# Patient Record
Sex: Female | Born: 1955 | Race: White | Hispanic: No | Marital: Married | Smoking: Never smoker
Health system: Southern US, Community
[De-identification: ages and names within clinical notes are randomized; demographics above are authoritative.]

## PROBLEM LIST (undated history)

## (undated) DIAGNOSIS — Q7962 Hypermobile Ehlers-Danlos syndrome: Secondary | ICD-10-CM

## (undated) DIAGNOSIS — E213 Hyperparathyroidism, unspecified: Secondary | ICD-10-CM

## (undated) DIAGNOSIS — M545 Low back pain, unspecified: Secondary | ICD-10-CM

## (undated) DIAGNOSIS — R112 Nausea with vomiting, unspecified: Secondary | ICD-10-CM

## (undated) DIAGNOSIS — Z9889 Other specified postprocedural states: Secondary | ICD-10-CM

## (undated) DIAGNOSIS — M79606 Pain in leg, unspecified: Secondary | ICD-10-CM

## (undated) DIAGNOSIS — Z87442 Personal history of urinary calculi: Secondary | ICD-10-CM

## (undated) DIAGNOSIS — E669 Obesity, unspecified: Secondary | ICD-10-CM

## (undated) DIAGNOSIS — M797 Fibromyalgia: Secondary | ICD-10-CM

## (undated) DIAGNOSIS — G4700 Insomnia, unspecified: Secondary | ICD-10-CM

## (undated) DIAGNOSIS — J189 Pneumonia, unspecified organism: Secondary | ICD-10-CM

## (undated) DIAGNOSIS — E785 Hyperlipidemia, unspecified: Secondary | ICD-10-CM

## (undated) DIAGNOSIS — R011 Cardiac murmur, unspecified: Secondary | ICD-10-CM

## (undated) DIAGNOSIS — J45909 Unspecified asthma, uncomplicated: Secondary | ICD-10-CM

## (undated) DIAGNOSIS — T7840XA Allergy, unspecified, initial encounter: Secondary | ICD-10-CM

## (undated) DIAGNOSIS — T4145XA Adverse effect of unspecified anesthetic, initial encounter: Secondary | ICD-10-CM

## (undated) DIAGNOSIS — I10 Essential (primary) hypertension: Secondary | ICD-10-CM

## (undated) DIAGNOSIS — F419 Anxiety disorder, unspecified: Secondary | ICD-10-CM

## (undated) DIAGNOSIS — K219 Gastro-esophageal reflux disease without esophagitis: Secondary | ICD-10-CM

## (undated) DIAGNOSIS — K760 Fatty (change of) liver, not elsewhere classified: Secondary | ICD-10-CM

## (undated) DIAGNOSIS — H269 Unspecified cataract: Secondary | ICD-10-CM

## (undated) DIAGNOSIS — Z8709 Personal history of other diseases of the respiratory system: Secondary | ICD-10-CM

## (undated) DIAGNOSIS — T8859XA Other complications of anesthesia, initial encounter: Secondary | ICD-10-CM

## (undated) DIAGNOSIS — R7303 Prediabetes: Secondary | ICD-10-CM

## (undated) DIAGNOSIS — Z8719 Personal history of other diseases of the digestive system: Secondary | ICD-10-CM

## (undated) DIAGNOSIS — M1990 Unspecified osteoarthritis, unspecified site: Secondary | ICD-10-CM

## (undated) DIAGNOSIS — Z87448 Personal history of other diseases of urinary system: Secondary | ICD-10-CM

## (undated) DIAGNOSIS — M810 Age-related osteoporosis without current pathological fracture: Secondary | ICD-10-CM

## (undated) DIAGNOSIS — G4730 Sleep apnea, unspecified: Secondary | ICD-10-CM

## (undated) HISTORY — PX: OTHER SURGICAL HISTORY: SHX169

## (undated) HISTORY — DX: Age-related osteoporosis without current pathological fracture: M81.0

## (undated) HISTORY — PX: JOINT REPLACEMENT: SHX530

## (undated) HISTORY — DX: Unspecified cataract: H26.9

## (undated) HISTORY — DX: Unspecified osteoarthritis, unspecified site: M19.90

## (undated) HISTORY — PX: BREAST BIOPSY: SHX20

## (undated) HISTORY — PX: SPINE SURGERY: SHX786

## (undated) HISTORY — DX: Hypermobile Ehlers-Danlos syndrome: Q79.62

## (undated) HISTORY — DX: Allergy, unspecified, initial encounter: T78.40XA

## (undated) HISTORY — DX: Unspecified asthma, uncomplicated: J45.909

## (undated) HISTORY — PX: CERVICAL DISCECTOMY: SHX98

## (undated) HISTORY — PX: CHOLECYSTECTOMY: SHX55

## (undated) HISTORY — DX: Essential (primary) hypertension: I10

## (undated) HISTORY — PX: COLONOSCOPY: SHX174

## (undated) HISTORY — PX: LAPAROSCOPIC GASTRIC RESTRICTIVE DUODENAL PROCEDURE (DUODENAL SWITCH): SHX6667

## (undated) HISTORY — PX: UPPER GASTROINTESTINAL ENDOSCOPY: SHX188

## (undated) HISTORY — PX: APPENDECTOMY: SHX54

---

## 1998-02-09 ENCOUNTER — Other Ambulatory Visit: Admission: RE | Admit: 1998-02-09 | Discharge: 1998-02-09 | Payer: Self-pay | Admitting: Internal Medicine

## 1998-04-06 ENCOUNTER — Other Ambulatory Visit: Admission: RE | Admit: 1998-04-06 | Discharge: 1998-04-06 | Payer: Self-pay | Admitting: Obstetrics and Gynecology

## 1998-09-05 ENCOUNTER — Ambulatory Visit (HOSPITAL_COMMUNITY): Admission: RE | Admit: 1998-09-05 | Discharge: 1998-09-05 | Payer: Self-pay | Admitting: Cardiology

## 1999-08-31 ENCOUNTER — Encounter: Admission: RE | Admit: 1999-08-31 | Discharge: 1999-08-31 | Payer: Self-pay | Admitting: Obstetrics and Gynecology

## 1999-08-31 ENCOUNTER — Encounter: Payer: Self-pay | Admitting: Obstetrics and Gynecology

## 1999-12-18 ENCOUNTER — Other Ambulatory Visit: Admission: RE | Admit: 1999-12-18 | Discharge: 1999-12-18 | Payer: Self-pay | Admitting: Obstetrics and Gynecology

## 2000-03-18 ENCOUNTER — Encounter: Admission: RE | Admit: 2000-03-18 | Discharge: 2000-03-18 | Payer: Self-pay | Admitting: Internal Medicine

## 2000-03-18 ENCOUNTER — Encounter: Payer: Self-pay | Admitting: Internal Medicine

## 2000-03-31 ENCOUNTER — Ambulatory Visit (HOSPITAL_COMMUNITY): Admission: RE | Admit: 2000-03-31 | Discharge: 2000-03-31 | Payer: Self-pay | Admitting: Orthopedic Surgery

## 2000-03-31 ENCOUNTER — Encounter: Payer: Self-pay | Admitting: Orthopedic Surgery

## 2000-09-25 ENCOUNTER — Encounter: Payer: Self-pay | Admitting: Obstetrics and Gynecology

## 2000-09-25 ENCOUNTER — Encounter: Admission: RE | Admit: 2000-09-25 | Discharge: 2000-09-25 | Payer: Self-pay | Admitting: Obstetrics and Gynecology

## 2001-07-15 ENCOUNTER — Encounter: Payer: Self-pay | Admitting: Internal Medicine

## 2001-07-15 ENCOUNTER — Encounter: Admission: RE | Admit: 2001-07-15 | Discharge: 2001-07-15 | Payer: Self-pay | Admitting: Internal Medicine

## 2001-08-25 ENCOUNTER — Encounter: Payer: Self-pay | Admitting: Internal Medicine

## 2001-08-25 ENCOUNTER — Encounter: Admission: RE | Admit: 2001-08-25 | Discharge: 2001-08-25 | Payer: Self-pay | Admitting: Internal Medicine

## 2002-04-20 ENCOUNTER — Encounter: Payer: Self-pay | Admitting: Internal Medicine

## 2002-04-20 ENCOUNTER — Encounter: Admission: RE | Admit: 2002-04-20 | Discharge: 2002-04-20 | Payer: Self-pay | Admitting: Obstetrics and Gynecology

## 2003-04-20 ENCOUNTER — Ambulatory Visit (HOSPITAL_COMMUNITY): Admission: RE | Admit: 2003-04-20 | Discharge: 2003-04-20 | Payer: Self-pay | Admitting: Gastroenterology

## 2003-10-14 ENCOUNTER — Ambulatory Visit (HOSPITAL_COMMUNITY): Admission: RE | Admit: 2003-10-14 | Discharge: 2003-10-14 | Payer: Self-pay | Admitting: Gastroenterology

## 2003-10-14 ENCOUNTER — Encounter (INDEPENDENT_AMBULATORY_CARE_PROVIDER_SITE_OTHER): Payer: Self-pay | Admitting: *Deleted

## 2003-11-05 ENCOUNTER — Encounter: Admission: RE | Admit: 2003-11-05 | Discharge: 2003-11-05 | Payer: Self-pay | Admitting: Orthopedic Surgery

## 2004-09-13 ENCOUNTER — Encounter: Admission: RE | Admit: 2004-09-13 | Discharge: 2004-09-13 | Payer: Self-pay | Admitting: Internal Medicine

## 2004-10-15 ENCOUNTER — Encounter: Admission: RE | Admit: 2004-10-15 | Discharge: 2004-10-15 | Payer: Self-pay | Admitting: Internal Medicine

## 2005-04-01 ENCOUNTER — Emergency Department (HOSPITAL_COMMUNITY): Admission: EM | Admit: 2005-04-01 | Discharge: 2005-04-01 | Payer: Self-pay | Admitting: Emergency Medicine

## 2005-04-04 ENCOUNTER — Other Ambulatory Visit: Admission: RE | Admit: 2005-04-04 | Discharge: 2005-04-04 | Payer: Self-pay | Admitting: Obstetrics and Gynecology

## 2005-09-20 ENCOUNTER — Encounter: Admission: RE | Admit: 2005-09-20 | Discharge: 2005-09-20 | Payer: Self-pay | Admitting: Obstetrics and Gynecology

## 2005-10-16 ENCOUNTER — Encounter: Admission: RE | Admit: 2005-10-16 | Discharge: 2005-10-16 | Payer: Self-pay | Admitting: Obstetrics and Gynecology

## 2005-10-18 ENCOUNTER — Encounter (INDEPENDENT_AMBULATORY_CARE_PROVIDER_SITE_OTHER): Payer: Self-pay | Admitting: Specialist

## 2005-10-18 ENCOUNTER — Encounter: Admission: RE | Admit: 2005-10-18 | Discharge: 2005-10-18 | Payer: Self-pay | Admitting: Obstetrics and Gynecology

## 2005-11-01 ENCOUNTER — Encounter: Admission: RE | Admit: 2005-11-01 | Discharge: 2005-11-01 | Payer: Self-pay | Admitting: Orthopedic Surgery

## 2005-11-25 ENCOUNTER — Encounter: Payer: Self-pay | Admitting: Obstetrics and Gynecology

## 2006-08-13 ENCOUNTER — Encounter: Admission: RE | Admit: 2006-08-13 | Discharge: 2006-08-13 | Payer: Self-pay | Admitting: Orthopedic Surgery

## 2006-08-22 ENCOUNTER — Emergency Department (HOSPITAL_COMMUNITY): Admission: EM | Admit: 2006-08-22 | Discharge: 2006-08-22 | Payer: Self-pay | Admitting: Emergency Medicine

## 2006-09-02 ENCOUNTER — Encounter: Admission: RE | Admit: 2006-09-02 | Discharge: 2006-09-02 | Payer: Self-pay | Admitting: Orthopedic Surgery

## 2006-10-06 ENCOUNTER — Encounter: Admission: RE | Admit: 2006-10-06 | Discharge: 2006-10-06 | Payer: Self-pay | Admitting: Internal Medicine

## 2006-12-20 ENCOUNTER — Emergency Department (HOSPITAL_COMMUNITY): Admission: EM | Admit: 2006-12-20 | Discharge: 2006-12-20 | Payer: Self-pay | Admitting: Emergency Medicine

## 2007-01-21 ENCOUNTER — Ambulatory Visit: Payer: Self-pay | Admitting: Cardiology

## 2007-02-06 ENCOUNTER — Ambulatory Visit (HOSPITAL_COMMUNITY): Admission: RE | Admit: 2007-02-06 | Discharge: 2007-02-07 | Payer: Self-pay | Admitting: Neurosurgery

## 2007-03-09 ENCOUNTER — Encounter: Payer: Self-pay | Admitting: Cardiology

## 2007-03-09 ENCOUNTER — Ambulatory Visit: Payer: Self-pay

## 2007-03-16 ENCOUNTER — Ambulatory Visit: Payer: Self-pay | Admitting: Cardiology

## 2007-03-20 ENCOUNTER — Encounter: Admission: RE | Admit: 2007-03-20 | Discharge: 2007-03-20 | Payer: Self-pay | Admitting: Orthopedic Surgery

## 2007-04-03 ENCOUNTER — Encounter: Admission: RE | Admit: 2007-04-03 | Discharge: 2007-04-03 | Payer: Self-pay | Admitting: Neurosurgery

## 2007-06-04 ENCOUNTER — Encounter: Admission: RE | Admit: 2007-06-04 | Discharge: 2007-06-04 | Payer: Self-pay | Admitting: Orthopedic Surgery

## 2007-11-15 ENCOUNTER — Encounter: Admission: RE | Admit: 2007-11-15 | Discharge: 2007-11-15 | Payer: Self-pay | Admitting: Orthopedic Surgery

## 2008-11-21 ENCOUNTER — Encounter: Admission: RE | Admit: 2008-11-21 | Discharge: 2008-11-21 | Payer: Self-pay | Admitting: Orthopedic Surgery

## 2009-02-13 ENCOUNTER — Encounter: Admission: RE | Admit: 2009-02-13 | Discharge: 2009-02-13 | Payer: Self-pay | Admitting: Obstetrics and Gynecology

## 2009-02-18 DIAGNOSIS — I10 Essential (primary) hypertension: Secondary | ICD-10-CM | POA: Insufficient documentation

## 2009-02-22 ENCOUNTER — Ambulatory Visit: Payer: Self-pay | Admitting: Cardiology

## 2009-04-28 ENCOUNTER — Ambulatory Visit (HOSPITAL_COMMUNITY): Admission: RE | Admit: 2009-04-28 | Discharge: 2009-04-28 | Payer: Self-pay | Admitting: Neurosurgery

## 2010-02-15 ENCOUNTER — Encounter: Admission: RE | Admit: 2010-02-15 | Discharge: 2010-02-15 | Payer: Self-pay | Admitting: Obstetrics and Gynecology

## 2010-07-08 ENCOUNTER — Encounter: Payer: Self-pay | Admitting: Orthopedic Surgery

## 2010-07-08 ENCOUNTER — Encounter: Payer: Self-pay | Admitting: Internal Medicine

## 2010-07-09 ENCOUNTER — Encounter: Payer: Self-pay | Admitting: Orthopedic Surgery

## 2010-09-19 LAB — CBC
Hemoglobin: 14.3 g/dL (ref 12.0–15.0)
MCHC: 34.7 g/dL (ref 30.0–36.0)
MCV: 87.9 fL (ref 78.0–100.0)
RBC: 4.69 MIL/uL (ref 3.87–5.11)

## 2010-09-19 LAB — BASIC METABOLIC PANEL
CO2: 28 mEq/L (ref 19–32)
Chloride: 102 mEq/L (ref 96–112)
GFR calc Af Amer: >60 mL/min (ref >60)
Sodium: 137 mEq/L (ref 135–145)

## 2010-10-30 NOTE — Assessment & Plan Note (Signed)
[facility]                            CARDIOLOGY OFFICE NOTE   [name], [name] KORINA TRETTER                       MRN:          [id number]  DATE:01/21/2007                            DOB:          01-03-1956    I was asked to consult on [name] [name] for poorly controlled  hypertension and some chest discomfort by Dr. Richardean Chimera.   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   She has been on Benicar hydrochlorothiazide which she takes at night.  She would rather get up once and urinate than be up during the day  trying to get things done, going back and forth to the bathroom.   Her blood pressures are usually under good control, but sometimes they  are elevated. She says they are usually good in the doctor's office! She  has had some knife-like chest pains in her breasts and her chest area.  She had a stress Myoview performed by [facility]  December 24, 2005. This showed EF of 76% with no ischemia or scar and normal  contractility.   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  Specifically, she has had her ankles rebuilt with a tibial tendon repair  transfer of both ankles and subtalar fusion of both ankles. These  surgeries occurred from 2001 and 2006.   PAST MEDICAL HISTORY:  She does not smoke, drink or use recreational  drugs.   She is currently on:  1. Benicar hydrochlorothiazide 40/25 daily.  2. Effexor XR 37.5 mg a day.  3. Lipitor 10 mg a day.  4. Aleve 2 b.i.d.  5. She takes Xanax, Flexeril and Darvocet p.r.n.   She has no known drug allergies.   SOCIAL HISTORY:  She is married and has one child. She is Veterinary surgeon,  Information systems manager in an Psychologist, prison and probation services role at [employer]   FAMILY HISTORY:  Is really negative for  premature coronary disease,  except her father did have coronary artery disease at age 37 and died at  age 47.   REVIEW OF SYSTEMS:  Is totally negative except for the HPI. She does  have some gastroesophageal reflux and history of a hiatal hernia. She  also states that she has a history of a murmur that comes and goes.   Her exam today, her blood pressure is 126/81 in the right arm with a  large cuff, 127/83 in the left arm with a large cuff. Pulse is 92 and  regular. She is 5 foot 2, weighs 226 pounds.  HEENT:  Normocephalic, atraumatic. PERRLA. Extraocular movements intact.  Sclerae are clear. Facial asymmetry is normal.  Carotids are full without bruit. There is no JVD. Thyroid is not  enlarged. Trachea is midline.  LUNGS:  Are clear.  HEART:  Reveals a poorly appreciated PMI. She has a normal S1 and S2.  ABDOMINAL EXAM:  Soft with good bowel sounds.  Organomegaly could not be  assessed.  EXTREMITIES:  Revealed scars on both medial ankles. She has very flat  arches. Pulses are intact.  SKIN:  Is loose and has poor elasticity. This particularly true of the  dorsa of the hands.  NEUROLOGIC EXAM:  Grossly intact.   EKG shows a question of an old inferior wall infarct and poor  progression across the anterior wall. This is all probably secondary to  mild left axis deviation and her body habitus. I explained this to her  at length.   ASSESSMENT:  1. Essential hypertension. Question of control has been raised to the      patient. She is very concerned about this. Apparently, she had an      echocardiogram done in the past which showed some mild left      ventricular hypertrophy which I do not have a copy of.  2. Atypical chest pain.  3. Sedentary lifestyle secondary to her inability to exercise from her      Ehlers-Danlos syndrome and ankle repair.  4. Obesity.  5. Abnormal EKG.   I spent about 30 minutes talking to [name] today. I have recommended  the following:  1. Two-D  echocardiogram to assess for left ventricular hypertrophy,      question of mitral valve prolapse and look at her ascending aortic      root. Type 3's do not classically get abdominal aortic aneurysms;      however, we would just like to take a look.  2. Maintain weight control. We discussed possible lap band which she      says her insurance will pay for. I think she should consider this.  3. Continue Benicar hydrochlorothiazide. She will check blood pressure      reads for [place] with a large adult cuff at home. She has been using a      wrist cuff, but we have asked her to do upper arm cuff. She knows      to check this at rest and check it at different times of the day.      She will bring these readings to [place].  4. I have cleared her for back surgery which is scheduled tentatively      for August 22 for a disk issue. This is with Dr. Wynetta Emery.   I will have her come back and see me in about four to six weeks. We will  follow up on her blood pressure which is the most important issue. I  will give her a call with her echocardiogram results.   She is on Lipitor for hyperlipidemia. I will assume this is under good  control, and these are followed by Dr. Andi Devon who does  excellent work with this.     Thomas C. Daleen Squibb, MD, [facility]  Electronically Signed    TCW/MedQ  DD: 01/21/2007  DT: 01/22/2007  Job #: [id number]   cc:   Juluis Mire, M.D.  Donalee Citrin, M.D.  Merlene Laughter. Renae Gloss, M.D.

## 2010-10-30 NOTE — Assessment & Plan Note (Signed)
[facility]                            CARDIOLOGY OFFICE NOTE   NAME:[name] [name] SLONIKER                       MRN:          [id number]  DATE:03/16/2007                            DOB:          08-11-1955    The patient returns today to follow up on her blood pressure as well as  the findings of her two-dimensional echocardiogram.   Her blood pressure is under good control today at 120/79, her pulse is  90 and regular.  She has not lost any weight.   MEDICATIONS:  1. Benicar 40/25 daily.  2. Lipitor 10 mg a day.   Her two-dimensional echocardiogram showed no significant aortic root  dilatation, normal left ventricular systolic function, upper limits of  normal of left ventricular wall thickness, but no definite hypertrophy,  no mitral valve prolapse and normal right-sided structures and function.  This was shared with her today.   I have asked the patient to continue with good blood pressure control  and frequent checks, her statin, and also with her weight reduction and  exercise.  We will plan on seeing her back on a p.r.n. basis.     Thomas C. Daleen Squibb, MD, [facility]  Electronically Signed    TCW/MedQ  DD: 03/16/2007  DT: 03/16/2007  Job #: [id number]   cc:   Juluis Mire, M.D.  Merlene Laughter. Renae Gloss, M.D.

## 2010-10-30 NOTE — Op Note (Signed)
[name] [name], [name] [name]                ACCOUNT NO.:  [id number]   MEDICAL RECORD NO.:  [id number]          PATIENT TYPE:  OIB   LOCATION:  5123                         FACILITY:  [facility]   PHYSICIAN:  Donalee Citrin, M.D.        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   PROCEDURE:  Extraforaminal approach to a disk herniation at L4-5 on the  right with microscopic decompression of the right L4 nerve root.   SURGEON:  Donalee Citrin, M.D.   ASSISTANT:  Kritzer.   ANESTHESIA:  General endotracheal.   HISTORY OF PRESENT ILLNESS:  The patient is a pleasant 55 year old  female who has had aggressive worsening back and right leg pain  radiating down through the outside of her thigh to the front of her shin  with weakness in her quadriceps and loss of right knee jerk reflex.  MRI  scan showed a foraminal disk herniation at this level.  The patient was  recommended an extraforaminal approach to diskectomy at L4-5 on the  right.  The risks and benefits of the operation were explained to the  patient, as well as the possibility of having to go both intra and  extraforaminal due to the location of the fragment.  After the patient  failed all forms of conservative treatment, she understands and agrees  to proceed forth.   DESCRIPTION OF PROCEDURE:  The patient was brought to the OR and  received general anesthesia.  She was placed on the Wilson frame.  The  back was prepped and draped in the usual sterile fashion.  Preoperative  imaging localized the L4-5 disk space.  After infiltration of 10 mL of  lidocaine with epinephrine, a midline incision was made and Bovie  electrocautery was used to take down the subcutaneous tissue.  Subperiosteal dissection was carried to the lamina of L4 and L5 and the  outside facet complex of 3-4 exposing the L4 TP.  This was marked with  an intraoperative x-ray.  Then working in the quadrant just inferior to  the L4 TP, the lateral aspect of the pars and the superior aspect of the  L4-5 facet was drilled down.  Then using a 1 and 4 Penfield the  intertransverse ligament was dissected off of the undersurface of the  bone of the lateral facet complex and lateral pars.  This was all bitten  away with the 2 and 3 mm Kerrison punch to gain access to the  extraforaminal space.  Then the intertransverse ligament was dissected  off of the nerve root.  The L4 nerve root was immediately visualized,  and using 2 and 3 mm Kerrison punch the intertransverse ligament was  removed exposing the L4 nerve root.  It was noted to be markedly  displaced cephalad from the large disk herniation still contained within  the ligament just inferior.  The epidural veins were coagulated over the  line of the disk space and then the annulotomy was made with a lumbar  scalpel and the disk was cleaned out.  Using a downgoing Epstein  curette, up-bite pituitaries and nerve hook, several more fragments of  disk were removed from both the extraforaminal space, as well as  reaching inside and grabbing the partial foraminal component that was  still underneath the pars.  Additional pars was taken to gain access  further medially.  The disk came out with ease and there was no further  stenosis on the L4 root.  It was held with a hockey-stick both medially  and laterally, cephalocaudal and no further stenosis, so it was not felt  it was needed to go and do an intraforaminal exposure.  So the  extraforaminal was felt to be adequate.  After adequate decompression of  the nerve root had been achieved and the disk had been cleaned out, the  wound was copiously irrigated and meticulous hemostasis was maintained.  Gelfoam was overlayed on top of the dura in the extraforaminal space.  The wound  was closed in layers with interrupted Vicryl with a running 4-  0 subcuticular on the skin.  Benzoin and Steri-Strips were applied.  The  patient went to the recovery room in stable condition.  At the end of  the case, needle counts and sponge counts were correct.           ______________________________  Donalee Citrin, M.D.     GC/MEDQ  D:  02/06/2007  T:  02/06/2007  Job:  [id number]

## 2010-11-06 NOTE — Op Note (Signed)
NAME:  [name] [name], [name] [name]                          ACCOUNT NO.:  [id number]   MEDICAL RECORD NO.:  [id number]                   PATIENT TYPE:  AMB   LOCATION:  ENDO                                 FACILITY:  [facility]   PHYSICIAN:  Petra Kuba, M.D.                 DATE OF BIRTH:  09-01-1955   DATE OF PROCEDURE:  10/14/2003  DATE OF DISCHARGE:                                 OPERATIVE REPORT   PROCEDURE:  Colonoscopy.   INDICATIONS FOR PROCEDURE:  Bright red blood per rectum probably due for  hemorrhoids almost due for colonic screening.   Consent was signed after risks, benefits, methods, and options were  thoroughly discussed in the office.   MEDICINES USED:  Demerol 100, Versed 10.   DESCRIPTION OF PROCEDURE:  Rectal inspection was pertinent for small  external hemorrhoids. Digital exam was negative. The video colonoscope was  inserted, fairly easily advanced around the colon to the cecum. This did  require rolling her on her back and some abdominal pressure.  No abnormality  was seen on insertion.  The cecum was identified by the appendiceal orifice  and the ileocecal valve. In fact, the scope was inserted a very short ways  into the terminal ileum, scope was slowly withdrawn. The prep was adequate.  The cecum, ascending, descending, transverse were normal.  As the scope was  withdrawn around the left side of the colon, a few questionable tiny  hyperplastic appearing polyps were seen and were cold biopsied and put in  the same container. This included rectal and distal sigmoid. Anal rectal  pullthrough and retroflexion confirmed some small hemorrhoids.  The scope  was reinserted a short ways up the left side of the colon, air was  suctioned, scope removed. The patient tolerated the procedure well. There  was no obvious or immediate complications.   ENDOSCOPIC DIAGNOSIS:  1. Internal and external hemorrhoids.  2. Tiny left sided rectal and distal sigmoid few tiny questionable  polyps     cold biopsied.  3. Otherwise within normal limits to the terminal ileum.   PLAN:  Await pathology to determine future colonic screening.  Happy to see  back p.r.n. otherwise return care to Dr. Renae Gloss for the customary health  care maintenance to include yearly rectals and guaiacs.                                               Petra Kuba, M.D.    MEM/MEDQ  D:  10/14/2003  T:  10/14/2003  Job:  [id number]   cc:   Merlene Laughter. Renae Gloss, M.D.  [address]  [address]  [place]  [place] [address]  Fax: [phone]

## 2010-11-06 NOTE — Op Note (Signed)
NAME:  [name] [name], [name] [name]                          ACCOUNT NO.:  [id number]   MEDICAL RECORD NO.:  [id number]                   PATIENT TYPE:  AMB   LOCATION:  ENDO                                 FACILITY:  [facility]   PHYSICIAN:  Petra Kuba, M.D.                 DATE OF BIRTH:  [birth year]   DATE OF PROCEDURE:  04/20/2003  DATE OF DISCHARGE:                                 OPERATIVE REPORT   PROCEDURE PERFORMED:  Esophagogastroduodenoscopy with Savary dilatation.   ENDOSCOPIST:  Petra Kuba, M.D.   INDICATIONS FOR PROCEDURE:  Dysphagia. Consent was signed after the risks,  benefits, methods and options were thoroughly discussed in the office.   MEDICINES USED:  Demerol 100 mg, Versed 8 mg.   DESCRIPTION OF PROCEDURE:  The video endoscope was inserted by direct  vision.  The proximal and midesophagus was normal.  In the distal esophagus  was some tapering and some spasm but no frank stricture or ring and no signs  of esophagitis.  Scope could pass easily through this area but it did not  seem to relax.  The scope again was withdrawn back through the esophagus and  back to the posterior pharynx and no additional findings were seen.  The  scope was reinserted into the stomach and advanced through a normal antrum,  normal pylorus into a normal duodenal bulb and around the C-loop to a normal  second portion of the duodenum.  The scope was withdrawn back to the bulb  and a good look there ruled out ulcers in that location.  Scope was  withdrawn back to the stomach and retroflexed.  The angularis, cardia and  fundus, lesser and greater curve were evaluated on retroflex visualization  without abnormalities.  Straight visualization of the stomach was normal.  The scope was rewithdrawn one more time through the esophagus.  Again no  additional findings were seen. The scope was then advanced to the antrum and  the customary J-loop of the scope was confirmed in the antrum under  fluoroscopy.   The Savary wire was advanced.  The scope was withdrawn making  sure to keep the wire in the proper location under fluoroscopy and in  succession, the Savary 14 and then 16 mm dilators were advanced.  Both were  confirmed in the proper position in the stomach under fluoroscopy.  Once the  16 was confirmed in the stomach, it was held for a few seconds.  The wire  was withdrawn back into the dilator and both were removed in tandem.  There  was no heme on either of the dilators.  This dilator and wire were removed.  The patient tolerated the procedure well.  There were no obvious immediate  complication.   ENDOSCOPIC DIAGNOSIS:  1. Tapered esophagus with distal esophageal spasm.  2. Otherwise normal esophagogastroduodenoscopy.   THERAPY:  Savary dilatation under fluoroscopy to 16 mm  without resistance or  heme.    PLAN:  See now the dilation works.  Try to get her old barium swallow that I  believe she had and we are waiting on those results.  Probably if this is  not helpful, repeat barium swallow and consider manometry next if signs of  achalasia.  Otherwise follow up in one to two months in the office to  recheck symptoms and decide on any other work-up and plans at that juncture.                                               Petra Kuba, M.D.    MEM/MEDQ  D:  04/20/2003  T:  04/21/2003  Job:  [id number]   cc:   Merlene Laughter. Renae Gloss, M.D.  [address]  [address]  [place]  [place] [address]  Fax: [phone]

## 2011-02-09 ENCOUNTER — Other Ambulatory Visit: Payer: Self-pay | Admitting: Cardiology

## 2011-03-29 LAB — CBC
HCT: 39.1
MCV: 88.6
Platelets: 354
RDW: 12.9
WBC: 8.4

## 2011-03-29 LAB — BASIC METABOLIC PANEL
BUN: 11
CO2: 29
Calcium: 9.7
Creatinine, Ser: 0.82
Glucose, Bld: 165 — ABNORMAL HIGH

## 2011-06-20 ENCOUNTER — Other Ambulatory Visit: Payer: Self-pay | Admitting: Cardiology

## 2011-06-21 ENCOUNTER — Other Ambulatory Visit: Payer: Self-pay | Admitting: Obstetrics and Gynecology

## 2011-06-21 DIAGNOSIS — Z1231 Encounter for screening mammogram for malignant neoplasm of breast: Secondary | ICD-10-CM

## 2011-07-09 ENCOUNTER — Ambulatory Visit
Admission: RE | Admit: 2011-07-09 | Discharge: 2011-07-09 | Disposition: A | Discharge status: Home / Self Care | Payer: BC Managed Care – PPO | Source: Ambulatory Visit | Attending: Obstetrics and Gynecology | Admitting: Obstetrics and Gynecology

## 2011-07-09 DIAGNOSIS — Z1231 Encounter for screening mammogram for malignant neoplasm of breast: Secondary | ICD-10-CM

## 2011-08-14 ENCOUNTER — Other Ambulatory Visit: Payer: Self-pay | Admitting: Cardiology

## 2011-09-17 ENCOUNTER — Other Ambulatory Visit: Payer: Self-pay | Admitting: Cardiology

## 2011-10-22 ENCOUNTER — Other Ambulatory Visit: Payer: Self-pay | Admitting: Cardiology

## 2012-09-06 ENCOUNTER — Ambulatory Visit (INDEPENDENT_AMBULATORY_CARE_PROVIDER_SITE_OTHER): Payer: BC Managed Care – PPO | Admitting: Family Medicine

## 2012-09-06 ENCOUNTER — Ambulatory Visit: Payer: BC Managed Care – PPO

## 2012-09-06 VITALS — BP 164/99 | HR 108 | Temp 99.1°F | Resp 18 | Ht 61.0 in | Wt 231.6 lb

## 2012-09-06 DIAGNOSIS — R05 Cough: Secondary | ICD-10-CM

## 2012-09-06 DIAGNOSIS — R059 Cough, unspecified: Secondary | ICD-10-CM

## 2012-09-06 DIAGNOSIS — R079 Chest pain, unspecified: Secondary | ICD-10-CM

## 2012-09-06 DIAGNOSIS — Z8709 Personal history of other diseases of the respiratory system: Secondary | ICD-10-CM

## 2012-09-06 MED ORDER — AZITHROMYCIN 250 MG PO TABS: ORAL_TABLET | ORAL | Status: DC

## 2012-09-06 MED ORDER — ALBUTEROL SULFATE HFA 108 (90 BASE) MCG/ACT IN AERS: 2.0000 | INHALATION_SPRAY | Freq: Four times a day (QID) | RESPIRATORY_TRACT | Status: DC | PRN

## 2012-09-06 NOTE — Patient Instructions (Signed)
The radiologist did not see any areas of concern on your xray.  You can start the antibiotic for an early bronchitis, mucinex as needed for cough, and albuterol only if wheezing.  Return to the clinic or go to the nearest emergency room if any of your symptoms worsen or new symptoms occur, including but not limited to any new or worsening chest symptoms.   Cough, Adult  A cough is a reflex that helps clear your throat and airways. It can help heal the body or may be a reaction to an irritated airway. A cough may only last 2 or 3 weeks (acute) or may last more than 8 weeks (chronic).  CAUSES Acute cough:  Viral or bacterial infections. Chronic cough:  Infections.  Allergies.  Asthma.  Post-nasal drip.  Smoking.  Heartburn or acid reflux.  Some medicines.  Chronic lung problems (COPD).  Cancer. SYMPTOMS   Cough.  Fever.  Chest pain.  Increased breathing rate.  High-pitched whistling sound when breathing (wheezing).  Colored mucus that you cough up (sputum). TREATMENT   A bacterial cough may be treated with antibiotic medicine.  A viral cough must run its course and will not respond to antibiotics.  Your caregiver may recommend other treatments if you have a chronic cough. HOME CARE INSTRUCTIONS   Only take over-the-counter or prescription medicines for pain, discomfort, or fever as directed by your caregiver. Use cough suppressants only as directed by your caregiver.  Use a cold steam vaporizer or humidifier in your bedroom or home to help loosen secretions.  Sleep in a semi-upright position if your cough is worse at night.  Rest as needed.  Stop smoking if you smoke. SEEK IMMEDIATE MEDICAL CARE IF:   You have pus in your sputum.  Your cough starts to worsen.  You cannot control your cough with suppressants and are losing sleep.  You begin coughing up blood.  You have difficulty breathing.  You develop pain which is getting worse or is uncontrolled  with medicine.  You have a fever. MAKE SURE YOU:   Understand these instructions.  Will watch your condition.  Will get help right away if you are not doing well or get worse. Document Released: 11/30/2010 Document Revised: 08/26/2011 Document Reviewed: 11/30/2010 [facility] Patient Information 2013 [place], [place].

## 2012-09-06 NOTE — Progress Notes (Signed)
Subjective:    Patient ID: [name] [name], female    DOB: 03-05-1956, 57 y.o.   MRN: [id number]  HPI ERRIKA [name] is a 57 y.o. female  Started with dry cough 2 days ago - initially though d/t allergies, last night - more rattling with cough in chest. No rhinorrhea, no congestion, slight ha.  Temp around 99, some chills. No bodyaches.  Feels sore in chest - center of chest - lower.  Noticed cough before chest pain..  No hx of heart dz, but has HTN and hyperlipidemia. Last stress test 6 years ago - normal.  Slight dyspnea - at rest.  Hx of wheeze in past with allergies - none currently, but out of inhaler.   Has had abnormal EKG in past "that looks like heart attack", but had stress test for that reason, told that EKG was ok.   PCP: Renae Gloss.   FH: father with MI - 62yo, then CABG at 68yo.   SH: no known sick contacts.   No flu vaccine this year.   Hx or recurrent bronchitis, and pna about 14 years ago. Last bronchitis 5 years ago.   Mucinex DM.    Review of Systems  Constitutional: Positive for fever (low grade - 99. ). Negative for chills.  HENT: Negative for congestion and rhinorrhea.   Respiratory: Positive for cough and shortness of breath.   Cardiovascular: Positive for chest pain (with cough). Negative for leg swelling.       Objective:   Physical Exam  Vitals reviewed. Constitutional: She is oriented to person, place, and time. She appears well-developed and well-nourished. No distress.  HENT:  Head: Normocephalic and atraumatic.  Right Ear: Hearing, tympanic membrane, external ear and ear canal normal.  Left Ear: Hearing, tympanic membrane, external ear and ear canal normal.  Nose: Nose normal.  Mouth/Throat: Oropharynx is clear and moist. No oropharyngeal exudate.  Eyes: Conjunctivae and EOM are normal. Pupils are equal, round, and reactive to light.  Cardiovascular: Normal rate, regular rhythm, normal heart sounds and intact distal pulses.   No murmur  heard. Pulmonary/Chest: Effort normal. No stridor. No respiratory distress. She has no wheezes. She has rhonchi (few faint coarse bs - RLL, no wheeze, no distress. ) in the right lower field. She has no rales.  Musculoskeletal: She exhibits no edema.  Lymphadenopathy:    She has no cervical adenopathy.  Neurological: She is alert and oriented to person, place, and time.  Skin: Skin is warm and dry. No rash noted.  Psychiatric: She has a normal mood and affect. Her behavior is normal.    EKG: SR, downward QRS in III, no apparent significant changes from 02/22/09.   [facility] reading (PRIMARY) by  Dr. Neva Seat: CXR: RLL/RUL linear marking at periphery - r/o ptx. Rad to overread.    CXR report: Findings: Normal mediastinum and cardiac silhouette. Costophrenic  angles are clear. No effusion, infiltrate, or pneumothorax.  Linear markings in the right lateral thorax are felt represent to  skin markings and sheet type markings. Anterior cervical fusion  noted.  IMPRESSION:  No acute cardiopulmonary process.      Assessment & Plan:  [name] is a 57 y.o. female Chest pain - Plan: EKG 12-Lead  Cough - Plan: DG Chest 2 View, albuterol (PROVENTIL HFA;VENTOLIN HFA) 108 (90 BASE) MCG/ACT inhaler  Hx of acute bronchitis with bronchospasm - Plan: albuterol (PROVENTIL HFA;VENTOLIN HFA) 108 (90 BASE) MCG/ACT inhaler  Cough with possible early bronchitis vs CAP.  Reassuring  CXR, and suspect chest pain with cough.  Start Zpak, proair if wheeze, mucinex ofor cough.  ER/911 chest pain porecautions reviewed, but reassuring EKG and likely infection/MSK cause of pain. Understanding expressed.    Patient Instructions  The radiologist did not see any areas of concern on your xray.  You can start the antibiotic for an early bronchitis, mucinex as needed for cough, and albuterol only if wheezing.  Return to the clinic or go to the nearest emergency room if any of your symptoms worsen or new symptoms occur,  including but not limited to any new or worsening chest symptoms.   Cough, Adult  A cough is a reflex that helps clear your throat and airways. It can help heal the body or may be a reaction to an irritated airway. A cough may only last 2 or 3 weeks (acute) or may last more than 8 weeks (chronic).  CAUSES Acute cough:  Viral or bacterial infections. Chronic cough:  Infections.  Allergies.  Asthma.  Post-nasal drip.  Smoking.  Heartburn or acid reflux.  Some medicines.  Chronic lung problems (COPD).  Cancer. SYMPTOMS   Cough.  Fever.  Chest pain.  Increased breathing rate.  High-pitched whistling sound when breathing (wheezing).  Colored mucus that you cough up (sputum). TREATMENT   A bacterial cough may be treated with antibiotic medicine.  A viral cough must run its course and will not respond to antibiotics.  Your caregiver may recommend other treatments if you have a chronic cough. HOME CARE INSTRUCTIONS   Only take over-the-counter or prescription medicines for pain, discomfort, or fever as directed by your caregiver. Use cough suppressants only as directed by your caregiver.  Use a cold steam vaporizer or humidifier in your bedroom or home to help loosen secretions.  Sleep in a semi-upright position if your cough is worse at night.  Rest as needed.  Stop smoking if you smoke. SEEK IMMEDIATE MEDICAL CARE IF:   You have pus in your sputum.  Your cough starts to worsen.  You cannot control your cough with suppressants and are losing sleep.  You begin coughing up blood.  You have difficulty breathing.  You develop pain which is getting worse or is uncontrolled with medicine.  You have a fever. MAKE SURE YOU:   Understand these instructions.  Will watch your condition.  Will get help right away if you are not doing well or get worse. Document Released: 11/30/2010 Document Revised: 08/26/2011 Document Reviewed: 11/30/2010 [facility]  Patient Information 2013 [place], [place].     Meds ordered this encounter                         . albuterol (PROVENTIL HFA;VENTOLIN HFA) 108 (90 BASE) MCG/ACT inhaler    Sig: Inhale 2 puffs into the lungs every 6 (six) hours as needed for wheezing.    Dispense:  1 Inhaler    Refill:  0  . azithromycin (ZITHROMAX) 250 MG tablet    Sig: Take 2 pills by mouth on day 1, then 1 pill by mouth per day on days 2 through 5.    Dispense:  6 each    Refill:  0

## 2012-09-09 ENCOUNTER — Ambulatory Visit: Payer: BC Managed Care – PPO

## 2012-09-09 ENCOUNTER — Ambulatory Visit (INDEPENDENT_AMBULATORY_CARE_PROVIDER_SITE_OTHER): Payer: BC Managed Care – PPO | Admitting: Family Medicine

## 2012-09-09 VITALS — BP 164/100 | HR 88 | Temp 99.0°F | Resp 18 | Ht 61.0 in | Wt 230.0 lb

## 2012-09-09 DIAGNOSIS — R05 Cough: Secondary | ICD-10-CM

## 2012-09-09 DIAGNOSIS — J45909 Unspecified asthma, uncomplicated: Secondary | ICD-10-CM

## 2012-09-09 DIAGNOSIS — R0989 Other specified symptoms and signs involving the circulatory and respiratory systems: Secondary | ICD-10-CM

## 2012-09-09 DIAGNOSIS — J209 Acute bronchitis, unspecified: Secondary | ICD-10-CM

## 2012-09-09 DIAGNOSIS — R059 Cough, unspecified: Secondary | ICD-10-CM

## 2012-09-09 LAB — POCT CBC
Granulocyte percent: 59.7 %G (ref 37–80)
MCV: 91.1 fL (ref 80–97)
MID (cbc): 0.5 (ref 0–0.9)
Platelet Count, POC: 260 10*3/uL (ref 142–424)
RBC: 5.22 M/uL (ref 4.04–5.48)

## 2012-09-09 MED ORDER — HYDROCODONE-HOMATROPINE 5-1.5 MG/5ML PO SYRP: ORAL_SOLUTION | ORAL | Status: DC

## 2012-09-09 MED ORDER — ALBUTEROL SULFATE (2.5 MG/3ML) 0.083% IN NEBU
2.5000 mg | INHALATION_SOLUTION | Freq: Once | RESPIRATORY_TRACT | Status: AC
  Administered 2012-09-09: 2.5 mg via RESPIRATORY_TRACT

## 2012-09-09 MED ORDER — IPRATROPIUM BROMIDE 0.02 % IN SOLN
0.5000 mg | Freq: Once | RESPIRATORY_TRACT | Status: AC
  Administered 2012-09-09: 0.5 mg via RESPIRATORY_TRACT

## 2012-09-09 MED ORDER — PREDNISONE 20 MG PO TABS: 40.0000 mg | ORAL_TABLET | Freq: Every day | ORAL | Status: DC

## 2012-09-09 NOTE — Patient Instructions (Signed)
Prednisone as discussed, inhaler using the technique discussed -2 puffs up to every 4 to 6 hours as needed.  Hydrocodone at night for cough ONLY if not wheezing or short of breath.   recheck in next few days if not improving. Return to the clinic or go to the nearest emergency room if any of your symptoms worsen or new symptoms occur.

## 2012-09-09 NOTE — Progress Notes (Signed)
Subjective:    Patient ID: [name] [name], female    DOB: 06-01-1956, 57 y.o.   MRN: [id number]  HPI [name] is a 57 y.o. female Seen 3 days ago - Cough with possible early bronchitis vs CAP.  Reassuring CXR, and suspected chest pain with cough.  Started Zpak, proair if wheeze, mucinex for cough. CXR report: Findings: Normal mediastinum and cardiac silhouette. Costophrenic  angles are clear. No effusion, infiltrate, or pneumothorax.  Linear markings in the right lateral thorax are felt represent to  skin markings and sheet type markings. Anterior cervical fusion  noted.   IMPRESSION:  No acute cardiopulmonary process.  Here for follow up -  Woke up last night - lung noises - crackling, gurgling, wheezing. Had used inhaler prior to bed. Using 3 puffs every 6 hours since last office visit. S/p 4 doses of Z pak. Congestion in back of throat. Still sore with coughing - ribs and back sore. mucinex - using off brand that has decongestant. and tussin DM otc.   Hx of HTN -  On decongestants above. Nausea overnight. Still with low grade fevers - 99.9 to 100 - chills at times.  No other new sick contacts.     Review of Systems  Constitutional: Positive for fever (subjective. ) and chills.  HENT: Negative for trouble swallowing.   Respiratory: Positive for cough, shortness of breath and wheezing.   Cardiovascular: Negative for chest pain (lower chest wall and back as above. ).       Objective:   Physical Exam  Vitals reviewed. Constitutional: She is oriented to person, place, and time. She appears well-developed and well-nourished. No distress.  HENT:  Head: Normocephalic and atraumatic.  Right Ear: Hearing, tympanic membrane, external ear and ear canal normal.  Left Ear: Hearing, tympanic membrane, external ear and ear canal normal.  Nose: Nose normal.  Mouth/Throat: Oropharynx is clear and moist. No oropharyngeal exudate.  Eyes: Conjunctivae and EOM are normal. Pupils are equal,  round, and reactive to light.  Cardiovascular: Normal rate, regular rhythm, normal heart sounds and intact distal pulses.   No murmur heard. Pulmonary/Chest: Effort normal. No accessory muscle usage. Not tachypneic. No respiratory distress. She has no decreased breath sounds. She has wheezes. She has rhonchi (coarse bs with wheeze diffusely. ). She has no rales.  Neurological: She is alert and oriented to person, place, and time.  Skin: Skin is warm and dry. No rash noted.  Psychiatric: She has a normal mood and affect. Her behavior is normal.   Peak flow - fair effort - 260 - predicted 380, with improved aeration and minimal end expiratory wheeze only.   [facility] reading (PRIMARY) by  Dr. Neva Seat: CXR: NAD.   Results for orders placed in visit on 09/09/12  POCT CBC      Result Value Range   WBC 5.9  4.6 - 10.2 K/uL   Lymph, poc 1.8  0.6 - 3.4   POC LYMPH PERCENT 31.1  10 - 50 %L   MID (cbc) 0.5  0 - 0.9   POC MID % 9.2  0 - 12 %M   POC Granulocyte 3.5  2 - 6.9   Granulocyte percent 59.7  37 - 80 %G   RBC 5.22  4.04 - 5.48 M/uL   Hemoglobin 15.2  12.2 - 16.2 g/dL   HCT, POC 57.8  46.9 - 47.9 %   MCV 91.1  80 - 97 fL   MCH, POC 29.1  27 -  31.2 pg   MCHC 31.9  31.8 - 35.4 g/dL   RDW, POC 16.1     Platelet Count, POC 260  142 - 424 K/uL   MPV 10.6  0 - 99.8 fL         Assessment & Plan:  [name] is a 57 y.o. female Cough - Plan: DG Chest 2 View, POCT CBC, albuterol (PROVENTIL) (2.5 MG/3ML) 0.083% nebulizer solution 2.5 mg, ipratropium (ATROVENT) nebulizer solution 0.5 mg, HYDROcodone-homatropine (HYCODAN) 5-1.5 MG/5ML syrup  Chest congestion - Plan: DG Chest 2 View, POCT CBC, albuterol (PROVENTIL) (2.5 MG/3ML) 0.083% nebulizer solution 2.5 mg, ipratropium (ATROVENT) nebulizer solution 0.5 mg  Acute bronchitis  Bronchitis, asthmatic - Plan: predniSONE (DELTASONE) 20 MG tablet  Asthmatic bronchitis - now with more wheeze than prior ov  - improved with nebs above.    Discussed correct technique for inhaler, start prednisone - 40mg  qd for 5d, finish z pak as prescribed, and hycodan only at bedtime if not dyspneic or wheezing and discussed rationale with patient - understanding expressed. rtc precautions.  Patient Instructions  Prednisone as discussed, inhaler using the technique discussed -2 puffs up to every 4 to 6 hours as needed.  Hydrocodone at night for cough ONLY if not wheezing or short of breath.   recheck in next few days if not improving. Return to the clinic or go to the nearest emergency room if any of your symptoms worsen or new symptoms occur.

## 2012-09-29 ENCOUNTER — Other Ambulatory Visit: Payer: Self-pay

## 2012-09-29 DIAGNOSIS — Z1231 Encounter for screening mammogram for malignant neoplasm of breast: Secondary | ICD-10-CM

## 2012-10-20 ENCOUNTER — Ambulatory Visit
Admission: RE | Admit: 2012-10-20 | Discharge: 2012-10-20 | Disposition: A | Discharge status: Home / Self Care | Payer: BC Managed Care – PPO | Source: Ambulatory Visit

## 2012-10-20 ENCOUNTER — Ambulatory Visit: Payer: BC Managed Care – PPO

## 2012-10-20 DIAGNOSIS — Z1231 Encounter for screening mammogram for malignant neoplasm of breast: Secondary | ICD-10-CM

## 2014-03-02 ENCOUNTER — Other Ambulatory Visit: Payer: Self-pay

## 2014-04-21 ENCOUNTER — Other Ambulatory Visit: Payer: Self-pay | Admitting: Obstetrics and Gynecology

## 2014-04-25 LAB — CYTOLOGY - PAP

## 2014-05-31 ENCOUNTER — Ambulatory Visit (INDEPENDENT_AMBULATORY_CARE_PROVIDER_SITE_OTHER): Payer: BC Managed Care – PPO | Admitting: Internal Medicine

## 2014-05-31 ENCOUNTER — Other Ambulatory Visit (INDEPENDENT_AMBULATORY_CARE_PROVIDER_SITE_OTHER): Payer: BC Managed Care – PPO

## 2014-05-31 ENCOUNTER — Encounter: Payer: Self-pay | Admitting: Internal Medicine

## 2014-05-31 ENCOUNTER — Encounter (INDEPENDENT_AMBULATORY_CARE_PROVIDER_SITE_OTHER): Payer: Self-pay

## 2014-05-31 ENCOUNTER — Ambulatory Visit (INDEPENDENT_AMBULATORY_CARE_PROVIDER_SITE_OTHER): Payer: BC Managed Care – PPO | Admitting: Geriatric Medicine

## 2014-05-31 DIAGNOSIS — E669 Obesity, unspecified: Secondary | ICD-10-CM

## 2014-05-31 DIAGNOSIS — M1990 Unspecified osteoarthritis, unspecified site: Secondary | ICD-10-CM

## 2014-05-31 DIAGNOSIS — Z23 Encounter for immunization: Secondary | ICD-10-CM

## 2014-05-31 DIAGNOSIS — Q796 Ehlers-Danlos syndrome, unspecified: Secondary | ICD-10-CM

## 2014-05-31 DIAGNOSIS — I10 Essential (primary) hypertension: Secondary | ICD-10-CM

## 2014-05-31 DIAGNOSIS — Z1211 Encounter for screening for malignant neoplasm of colon: Secondary | ICD-10-CM

## 2014-05-31 LAB — COMPREHENSIVE METABOLIC PANEL
ALT: 26 U/L (ref 0–35)
AST: 24 U/L (ref 0–37)
Albumin: 4.2 g/dL (ref 3.5–5.2)
Alkaline Phosphatase: 105 U/L (ref 39–117)
BILIRUBIN TOTAL: 0.4 mg/dL (ref 0.2–1.2)
BUN: 16 mg/dL (ref 6–23)
CALCIUM: 9.1 mg/dL (ref 8.4–10.5)
CO2: 25 mEq/L (ref 19–32)
Chloride: 106 mEq/L (ref 96–112)
Creatinine, Ser: 0.8 mg/dL (ref 0.4–1.2)
GFR: 79.07 mL/min (ref >60.00)
Glucose, Bld: 97 mg/dL (ref 70–99)
Potassium: 3.7 mEq/L (ref 3.5–5.1)
SODIUM: 138 meq/L (ref 135–145)
Total Protein: 7.7 g/dL (ref 6.0–8.3)

## 2014-05-31 LAB — CBC
HEMATOCRIT: 43.1 % (ref 36.0–46.0)
HEMOGLOBIN: 14.3 g/dL (ref 12.0–15.0)
MCHC: 33.1 g/dL (ref 30.0–36.0)
MCV: 86.4 fl (ref 78.0–100.0)
Platelets: 380 10*3/uL (ref 150.0–400.0)
RBC: 4.99 Mil/uL (ref 3.87–5.11)
RDW: 13.4 % (ref 11.5–15.5)
WBC: 8.4 10*3/uL (ref 4.0–10.5)

## 2014-05-31 LAB — LIPID PANEL
CHOL/HDL RATIO: 3
Cholesterol: 199 mg/dL (ref 0–200)
HDL: 60.1 mg/dL (ref >39.00)
LDL Cholesterol: 107 mg/dL — ABNORMAL HIGH (ref 0–99)
NonHDL: 138.9
Triglycerides: 159 mg/dL — ABNORMAL HIGH (ref 0.0–149.0)
VLDL: 31.8 mg/dL (ref 0.0–40.0)

## 2014-05-31 LAB — HEMOGLOBIN A1C: Hgb A1c MFr Bld: 6.1 % (ref 4.6–6.5)

## 2014-05-31 MED ORDER — SOLIFENACIN SUCCINATE 5 MG PO TABS: 5.0000 mg | ORAL_TABLET | Freq: Every day | ORAL | Status: DC

## 2014-05-31 NOTE — Progress Notes (Signed)
Pre visit review using our clinic review tool, if applicable. No additional management support is needed unless otherwise documented below in the visit note. 

## 2014-05-31 NOTE — Patient Instructions (Signed)
We will send you down to the lab to check on your blood work. We will also refer you to the GI doctor upstairs for a colonoscopy.  We will see you back in about 6 months to check and see how you're doing. If you have any new problems or questions before then please feel free to call our office.  We really think that you need to start working on exercise as every pound you can take off takes extra pressure off your joints including her knees and her back. As well it will help you to build muscle which will help to support your joints and your back and cause you less pain.  Serving Sizes What we call a serving size today is larger than it was in the past. A 1950s fast-food burger contained little more than 1 oz of meat, and a soft drink was 8 oz (1 cup). Today, a "quarter pounder" burger is at least 4 times that amount, and a 32 or 64 oz drink is not uncommon. A possible guide for eating when trying to lose weight is to eat about half as much as you normally do. Some estimates of serving sizes are:  1 Dairy serving:Individual container of yogurt (8 oz) or piece of cheese the size of your thumb (1 oz).  1 Grain serving: 1 slice of bread or  cup pasta.  1 Meat serving: The size of a deck of cards (3 oz).  1 Fruit serving: cup canned fruit or 1 medium fruit.  1 Vegetable serving:  cup of cooked or canned vegetables.  1 Fat serving:The size of 4 stacked dimes. Experts suggest spending 1 or 2 days measuring food portions you commonly eat. This will give you better practice at estimating serving sizes, and will also show whether you are eating an appropriate amount of food to meet your weight goals. If you find that you are eating more than you thought, try measuring your food for a few days so you can "reprogram" yourself to learn what makes a healthy portion for you. SUGGESTIONS FOR CONTROL  In restaurants, share entrees, or ask the waiter to put half the entre in a box or bag before you even  touch it.  Order lunch-sized portions. Many restaurants serve 4 to 6 oz of meat at lunch, compared with 8 to 10 oz at dinner.  Split dessert or skip it all together. Have a piece of fruit when you get home.  At home, use smaller plates and bowls. It will look as if you are eating more.  Plate your food in the kitchen rather than serving it "family style" at the table.  Wait 20 to 30 minutes before taking seconds. This is how long it takes your brain to recognize that you are full.  Check food labels for serving sizes. Eat 1 serving only.  Use measuring cups and spoons to see proper serving sizes.  Buy smaller packages of candy, popcorn, and snacks.  Avoid eating directly out of the bag or carton.  While eating half as much, exercise twice as much. Park further away from the mall, take the stairs instead of the escalator, and walk around your block. Losing weight is a slow, difficult process. It takes long-lasting lifestyle changes. You can make gradual changes over time so they become habits. Look to friends and family to support the healthy changes you are making. Avoid fad diets since they are often only temporary weight loss solutions. Document Released: 03/02/2003 Document Revised:  08/26/2011 Document Reviewed: 08/31/2013 [employer] Patient Information 2015 [employer]. This information is not intended to replace advice given to you by your health care provider. Make sure you discuss any questions you have with your health care provider.

## 2014-06-01 DIAGNOSIS — E669 Obesity, unspecified: Secondary | ICD-10-CM | POA: Insufficient documentation

## 2014-06-01 DIAGNOSIS — M1990 Unspecified osteoarthritis, unspecified site: Secondary | ICD-10-CM | POA: Insufficient documentation

## 2014-06-01 DIAGNOSIS — Z0000 Encounter for general adult medical examination without abnormal findings: Secondary | ICD-10-CM | POA: Insufficient documentation

## 2014-06-01 NOTE — Assessment & Plan Note (Signed)
Spoke with patient about the fact that her weight is likely causing some of her joint pain and she does state that she notices a difference in pain it when she goes up a little bit in weight. Unfortunately her physical activity is limited sometimes due to pain however have advised her that water activities may be a reasonable option.

## 2014-06-01 NOTE — Assessment & Plan Note (Signed)
Due to her ehlers-danlos as well as her morbid obesity she does have fairly severe osteoarthritis in multiple joints. If she is able to maintain with massage therapy I think that's great. We did discuss the fact that she needs to find some form of exercise she can do.

## 2014-06-01 NOTE — Assessment & Plan Note (Signed)
Patient referred for colonoscopy, check hemoglobin A1c, lipid panel, BMP, CBC.

## 2014-06-01 NOTE — Assessment & Plan Note (Signed)
Patient states that her home readings are much lower than current readings today. She did not take her medication this morning. She does not wish to change regimen at this time however she may need titration at next visit.

## 2014-06-01 NOTE — Progress Notes (Signed)
   Subjective:    Patient ID: [name] [name], female    DOB: 04-22-1956, [age] y.o.   MRN: [id number]  HPI The patient is a 58 year old female who comes in today to establish care. She does have ehlers-danlos, hypertension, asthma. She is doing fairly well overall, but she does battle with joint pains but she states that when she can keep up with massage therapy weekly she stays fairly pain-free. She is not exercising at this time although she knows she likely needs to.   Review of Systems  Constitutional: Negative for fever, activity change, appetite change and fatigue.  Respiratory: Negative for cough, chest tightness, shortness of breath and wheezing.   Cardiovascular: Negative for chest pain, palpitations and leg swelling.  Gastrointestinal: Negative for diarrhea and constipation.  Musculoskeletal: Positive for back pain, arthralgias and gait problem. Negative for myalgias.  Skin: Negative.   Neurological: Negative.       Objective:   Physical Exam  Constitutional: She is oriented to person, place, and time. She appears well-developed and well-nourished.  Obese  HENT:  Head: Normocephalic and atraumatic.  Eyes: EOM are normal.  Neck: Normal range of motion.  Cardiovascular: Normal rate and regular rhythm.   Pulmonary/Chest: Effort normal and breath sounds normal. No respiratory distress. She has no wheezes. She has no rales.  Abdominal: Soft. Bowel sounds are normal. She exhibits no distension. There is no tenderness. There is no rebound.  Neurological: She is alert and oriented to person, place, and time. Coordination normal.  Skin: Skin is warm and dry.   Filed Vitals:   05/31/14 1516 05/31/14 1604  BP: 168/102 152/100  Pulse: 103   Temp: 98.5 F (36.9 C)   TempSrc: Oral   Resp: 16   Height: 5' 1.5" (1.562 m)   Weight: 243 lb 6.4 oz (110.406 kg)   SpO2: 98%       Assessment & Plan:

## 2014-09-13 ENCOUNTER — Other Ambulatory Visit: Payer: Self-pay | Admitting: Orthopedic Surgery

## 2014-09-13 DIAGNOSIS — M25562 Pain in left knee: Secondary | ICD-10-CM

## 2014-11-04 ENCOUNTER — Other Ambulatory Visit: Payer: Self-pay

## 2014-11-04 DIAGNOSIS — Z1231 Encounter for screening mammogram for malignant neoplasm of breast: Secondary | ICD-10-CM

## 2014-11-11 ENCOUNTER — Ambulatory Visit: Payer: BC Managed Care – PPO

## 2014-11-17 ENCOUNTER — Other Ambulatory Visit: Payer: Self-pay | Admitting: Orthopedic Surgery

## 2014-11-17 DIAGNOSIS — M5489 Other dorsalgia: Secondary | ICD-10-CM

## 2014-11-24 ENCOUNTER — Ambulatory Visit
Admission: RE | Admit: 2014-11-24 | Discharge: 2014-11-24 | Disposition: A | Discharge status: Home / Self Care | Payer: BC Managed Care – PPO | Source: Ambulatory Visit

## 2014-11-24 DIAGNOSIS — Z1231 Encounter for screening mammogram for malignant neoplasm of breast: Secondary | ICD-10-CM

## 2014-11-26 ENCOUNTER — Ambulatory Visit
Admission: RE | Admit: 2014-11-26 | Discharge: 2014-11-26 | Disposition: A | Discharge status: Home / Self Care | Payer: BC Managed Care – PPO | Source: Ambulatory Visit | Attending: Orthopedic Surgery | Admitting: Orthopedic Surgery

## 2014-11-26 DIAGNOSIS — M5489 Other dorsalgia: Secondary | ICD-10-CM

## 2014-11-30 ENCOUNTER — Encounter: Payer: Self-pay | Admitting: Internal Medicine

## 2014-11-30 ENCOUNTER — Ambulatory Visit (INDEPENDENT_AMBULATORY_CARE_PROVIDER_SITE_OTHER): Payer: BC Managed Care – PPO | Admitting: Internal Medicine

## 2014-11-30 VITALS — BP 148/98 | HR 104 | Temp 98.8°F | Resp 18 | Ht 61.0 in | Wt 247.8 lb

## 2014-11-30 DIAGNOSIS — I10 Essential (primary) hypertension: Secondary | ICD-10-CM | POA: Diagnosis not present

## 2014-11-30 DIAGNOSIS — J45909 Unspecified asthma, uncomplicated: Secondary | ICD-10-CM | POA: Insufficient documentation

## 2014-11-30 DIAGNOSIS — J4530 Mild persistent asthma, uncomplicated: Secondary | ICD-10-CM | POA: Diagnosis not present

## 2014-11-30 MED ORDER — VENLAFAXINE HCL ER 150 MG PO CP24: 150.0000 mg | ORAL_CAPSULE | Freq: Every day | ORAL | Status: DC

## 2014-11-30 MED ORDER — ATORVASTATIN CALCIUM 40 MG PO TABS: 40.0000 mg | ORAL_TABLET | Freq: Every day | ORAL | Status: DC

## 2014-11-30 MED ORDER — FLUTICASONE-SALMETEROL 100-50 MCG/DOSE IN AEPB: 1.0000 | INHALATION_SPRAY | Freq: Two times a day (BID) | RESPIRATORY_TRACT | Status: DC

## 2014-11-30 MED ORDER — FUROSEMIDE 40 MG PO TABS: 40.0000 mg | ORAL_TABLET | Freq: Every day | ORAL | Status: DC

## 2014-11-30 MED ORDER — FLUCONAZOLE 150 MG PO TABS: 150.0000 mg | ORAL_TABLET | Freq: Once | ORAL | Status: DC

## 2014-11-30 MED ORDER — ALPRAZOLAM 0.5 MG PO TABS: 0.5000 mg | ORAL_TABLET | Freq: Every evening | ORAL | Status: DC | PRN

## 2014-11-30 MED ORDER — SOLIFENACIN SUCCINATE 5 MG PO TABS: 5.0000 mg | ORAL_TABLET | Freq: Every day | ORAL | Status: DC

## 2014-11-30 NOTE — Patient Instructions (Signed)
We have sent in diflucan. Take 1 pill today and if not better by Friday take the second pill.   We will also send in lasix 40 mg. Take 1 pill a day for the fluid. If that is not helping we will likely increase it to twice a day. Call us if it is not working well.   We have sent in the refills for you today.   We have also sent in the inhaler with the steroids, called advair. 1 puff twice a day for the breathing and it may take 1-2 weeks to work with you.

## 2014-11-30 NOTE — Assessment & Plan Note (Signed)
Is up 4 pounds since last visit and her arthritis is worse. Talked to her about the possibility of water aerobics or water exercise (walking) for cardio. She needs to work on weight loss and she knows that as well.

## 2014-11-30 NOTE — Progress Notes (Signed)
Pre visit review using our clinic review tool, if applicable. No additional management support is needed unless otherwise documented below in the visit note. 

## 2014-11-30 NOTE — Assessment & Plan Note (Signed)
More symptoms with recent bronchitis and needs ICS inhaler for short term use. Also using albuterol inhaler more. Likely some of this is from weight and lack of exercise and talked to her about the need for weight loss and exercise. Rx for advair and no need for refill of albuterol today.

## 2014-11-30 NOTE — Progress Notes (Signed)
   Subjective:    Patient ID: [name] [name], female    DOB: July 29, 1955, 59 y.o.   MRN: [id number]  HPI The patient is a 59 YO female coming in for blood pressure follow up. She has not seen the cardiologist and has been off her diltiazem for some time. Denies headaches. Feels like she is retaining fluid and some SOB with exertion. Not exercising and gaining weight since the winter. She is taking her blood pressure medicine as prescribed. She has been on fluid pills in the past and has not had them in some time. She also had bronchitis last week and has been taking an antibiotic. Now done and still some cough. Her lungs are worse and more SOB. She has been using her rescue inhaler more. Also got yeast infection from the antibiotic. For 3 days, worsening, not tried anything for it.    Review of Systems  Constitutional: Negative for fever, activity change, appetite change and fatigue.  HENT: Negative.   Respiratory: Positive for cough and shortness of breath. Negative for chest tightness and wheezing.   Cardiovascular: Negative for chest pain, palpitations and leg swelling.  Gastrointestinal: Negative for diarrhea and constipation.  Genitourinary: Positive for vaginal discharge.  Musculoskeletal: Positive for back pain, arthralgias and gait problem. Negative for myalgias.  Skin: Negative.   Neurological: Negative.       Objective:   Physical Exam  Constitutional: She is oriented to person, place, and time. She appears well-developed and well-nourished.  Obese  HENT:  Head: Normocephalic and atraumatic.  Eyes: EOM are normal.  Neck: Normal range of motion.  Cardiovascular: Normal rate and regular rhythm.   Pulmonary/Chest: Effort normal.  Some scattered wheezing, clears partially with cough.   Abdominal: Soft. Bowel sounds are normal. She exhibits no distension. There is no tenderness. There is no rebound.  Neurological: She is alert and oriented to person, place, and time. Coordination  normal.  Skin: Skin is warm and dry.   Filed Vitals:   11/30/14 1137  BP: 148/98  Pulse: 104  Temp: 98.8 F (37.1 C)  TempSrc: Oral  Resp: 18  Height: 5\' 1"  (1.549 m)  Weight: 247 lb 12.8 oz (112.401 kg)  SpO2: 94%      Assessment & Plan:

## 2014-11-30 NOTE — Assessment & Plan Note (Signed)
BP still elevated, add lasix 40 mg daily. If no fluid reduction will increase to BID. Continue valsartan. Needs labs at next visit since change.

## 2014-12-21 ENCOUNTER — Other Ambulatory Visit: Payer: Self-pay | Admitting: Internal Medicine

## 2015-01-16 ENCOUNTER — Encounter: Payer: Self-pay | Admitting: Neurology

## 2015-01-16 ENCOUNTER — Ambulatory Visit (INDEPENDENT_AMBULATORY_CARE_PROVIDER_SITE_OTHER): Payer: BC Managed Care – PPO | Admitting: Neurology

## 2015-01-16 ENCOUNTER — Other Ambulatory Visit (INDEPENDENT_AMBULATORY_CARE_PROVIDER_SITE_OTHER): Payer: BC Managed Care – PPO

## 2015-01-16 VITALS — BP 150/98 | HR 102 | Ht 61.0 in | Wt 249.1 lb

## 2015-01-16 DIAGNOSIS — R269 Unspecified abnormalities of gait and mobility: Secondary | ICD-10-CM | POA: Diagnosis not present

## 2015-01-16 DIAGNOSIS — R1310 Dysphagia, unspecified: Secondary | ICD-10-CM

## 2015-01-16 DIAGNOSIS — G609 Hereditary and idiopathic neuropathy, unspecified: Secondary | ICD-10-CM | POA: Diagnosis not present

## 2015-01-16 DIAGNOSIS — M797 Fibromyalgia: Secondary | ICD-10-CM

## 2015-01-16 DIAGNOSIS — R202 Paresthesia of skin: Secondary | ICD-10-CM

## 2015-01-16 LAB — VITAMIN B12: Vitamin B-12: 190 pg/mL — ABNORMAL LOW (ref 211–911)

## 2015-01-16 LAB — TSH: TSH: 1.81 u[IU]/mL (ref 0.35–4.50)

## 2015-01-16 LAB — CK: Total CK: 192 U/L — ABNORMAL HIGH (ref 7–177)

## 2015-01-16 MED ORDER — GABAPENTIN 300 MG PO CAPS: 300.0000 mg | ORAL_CAPSULE | Freq: Two times a day (BID) | ORAL | Status: DC

## 2015-01-16 NOTE — Progress Notes (Signed)
Note sent

## 2015-01-16 NOTE — Patient Instructions (Addendum)
1.  Start gabapentin  at bedtime for 3 days, and then increase to twice daily 2.  EMG of the legs 3.  Check blood work 4.  Return to clinic in 2 months

## 2015-01-16 NOTE — Progress Notes (Signed)
[facility] Neurology Division Clinic Note - Initial Visit   Date: 01/16/2015  [name] [name] MRN: [id number] DOB: 04/08/1956   Dear Dr. Charlett Blake:  Thank you for your kind referral of [name] [name] for consultation of weakness and imbalance. Although her history is well known to you, please allow [place] to reiterate it for the purpose of our medical record. The patient was accompanied to the clinic by self.    History of Present Illness: [name] [name] is a 59 y.o. right-handed Caucasian female with hypertension, depression, Ehler's Danlos syndrome type III, hyperlipiemia, morbid obesity, and history of L4-5 discectomy and decompression of right L4 nerve root and cervical diskectomy and fusion at C6-7 presenting for evaluation of weakness and imbalance.    She has a long history of generalized wholly body pain.  Initially this was diagnosed as fibromyalgia and later once she was found to have Ehler's Danlos syndrome, was attributed to pain related to the disease process.  Around early 2016, she started having worsening problems with her knees, described as locking up.  She has associated achy pain of her knees.  Pain is improved when resting and worse with activity and standing.  Pain does not radiate and remains localized to her knees.  There is no numbness/tingling of the legs.  She does not have any radiating back from her low back into her legs.  She feels that her legs are weak because she cannot climb stairs.  She has not done any physical therapy.  She complains of generalized myalgias.  She also complains weakness of her arms.    She has been using a cane for the past 15 years for her joint pain and balance problems.  She has fallen twice this year.   She complains of numbness/tingling in feet.    She complains of dysphagia and reports having esophageal stricture.  She also complains of blurry vision.    She is worried that she has a muscular dystrophy.    Out-side paper  records, electronic medical record, and images have been reviewed where available and summarized as:  MRI lumbar spine 11/26/2014: L2-3: Disc bulge but no neural compression. L3-4: Disc bulge but no neural compression. Discogenic edema on the right could be associated with back pain. L4-5: Disc bulge. Mild facet degeneration. Foraminal narrowing on the right that could possibly affect the exiting L4 nerve root. L5-S1: Bilateral facet degeneration that could be a cause of back pain or referred facet syndrome pain.  MRI cervical spine 11/21/2008:  Spondylosis most notable C3-C4 and C6-C7.  Disc osteophyte complex narrows the left C3-C4 and the left C6-C7 foramina.  This is similar in appearance to the prior examination.  Lab Results  Component Value Date   HGBA1C 6.1 05/31/2014     Past Medical History  Diagnosis Date  . Arthritis   . Asthma   . Hypertension   . Ehlers-Danlos syndrome type III     Past Surgical History  Procedure Laterality Date  . Joint replacement    . Spine surgery    . Cesarean section    . Achilles tendon tibial tendon fusion      4 surgeries     Medications:  Outpatient Encounter Prescriptions as of 01/16/2015  Medication Sig  . albuterol (PROVENTIL HFA;VENTOLIN HFA) 108 (90 BASE) MCG/ACT inhaler Inhale 2 puffs into the lungs every 6 (six) hours as needed for wheezing.  Marland Kitchen ALPRAZolam (XANAX) 0.5 MG tablet Take 1 tablet (0.5 mg total) by mouth  at bedtime as needed for anxiety.  Marland Kitchen atorvastatin (LIPITOR) 40 MG tablet Take 1 tablet (40 mg total) by mouth daily.  . Fluticasone-Salmeterol (ADVAIR) 100-50 MCG/DOSE AEPB Inhale 1 puff into the lungs 2 (two) times daily.  . furosemide (LASIX) 40 MG tablet Take 1 tablet (40 mg total) by mouth daily.  . solifenacin (VESICARE) 5 MG tablet Take 1 tablet (5 mg total) by mouth daily. take 1 tablet by mouth once daily  . valsartan (DIOVAN) 320 MG tablet take 1 tablet by mouth once daily  . venlafaxine XR (EFFEXOR-XR) 150 MG  24 hr capsule Take 1 capsule (150 mg total) by mouth daily.  . [DISCONTINUED] fluconazole (DIFLUCAN) 150 MG tablet Take 1 tablet (150 mg total) by mouth once.  . gabapentin (NEURONTIN) 300 MG capsule Take 1 capsule (300 mg total) by mouth 2 (two) times daily.   No facility-administered encounter medications on file as of 01/16/2015.     Allergies: No Known Allergies  Family History: Family History  Problem Relation Age of Onset  . Parkinson's disease Mother     Deceased, 65  . Heart disease Father     Deceased, 53  . Pancreatic cancer Sister     Survivor  . Healthy Daughter     Social History: History  Substance Use Topics  . Smoking status: Never Smoker   . Smokeless tobacco: Never Used  . Alcohol Use: No   History   Social History Narrative   Lives with husband in a 2 story home.  Has 1 daughter.     On disability in 2009.  Used to work as an Magazine features editor at [employer]       Review of Systems:  CONSTITUTIONAL: No fevers, chills, night sweats, or weight loss.   EYES: No visual changes or eye pain ENT: No hearing changes.  No history of nose bleeds.   RESPIRATORY: No cough, wheezing +shortness of breath.   CARDIOVASCULAR: Negative for chest pain, and palpitations.   GI: Negative for abdominal discomfort, blood in stools or black stools.  No recent change in bowel habits.   GU:  No history of incontinence.   MUSCLOSKELETAL: +history of joint pain or swelling.  +myalgias.   SKIN: Negative for lesions, rash, and itching.   HEMATOLOGY/ONCOLOGY: Negative for prolonged bleeding, bruising easily, and swollen nodes.  No history of cancer.   ENDOCRINE: Negative for cold or heat intolerance, polydipsia or goiter.   PSYCH:  +depression or anxiety symptoms.   NEURO: As Above.   Vital Signs:  BP 150/98 mmHg  Pulse 102  Ht 5\' 1"  (1.549 m)  Wt 249 lb 1 oz (112.974 kg)  BMI 47.08 kg/m2  SpO2 96%   General Medical Exam:   General:  Obese appearing, comfortable.   Eyes/ENT:  see cranial nerve examination.   Neck: No masses appreciated.  Full range of motion without tenderness.  No carotid bruits. Respiratory:  Clear to auscultation, good air entry bilaterally.   Cardiac:  Regular rate and rhythm, no murmur.   Extremities:  No deformities, edema, or skin discoloration.  Skin:  No rashes or lesions.  Neurological Exam: MENTAL STATUS including orientation to time, place, person, recent and remote memory, attention span and concentration, language, and fund of knowledge is normal.  Speech is not dysarthric.  CRANIAL NERVES: II:  No visual field defects.  Unremarkable fundi.   III-IV-VI: Pupils equal round and reactive to light.  Normal conjugate, extra-ocular eye movements in all directions of gaze.  No  nystagmus.  No ptosis.   V:  Normal facial sensation.    VII:  Normal facial symmetry and movements.  No pathologic facial reflexes.  VIII:  Normal hearing and vestibular function.   IX-X:  Normal palatal movement.   XI:  Normal shoulder shrug and head rotation.   XII:  Normal tongue strength and range of motion, no deviation or fasciculation.  MOTOR:  No atrophy, fasciculations or abnormal movements.  No pronator drift.  Tone is normal.    Right Upper Extremity:    Left Upper Extremity:    Deltoid  5/5   Deltoid  5/5   Biceps  5/5   Biceps  5/5   Triceps  5/5   Triceps  5/5   Wrist extensors  5/5   Wrist extensors  5/5   Wrist flexors  5/5   Wrist flexors  5/5   Finger extensors  5/5   Finger extensors  5/5   Finger flexors  5/5   Finger flexors  5/5   Dorsal interossei  5/5   Dorsal interossei  5/5   Abductor pollicis  5/5   Abductor pollicis  5/5   Tone (Ashworth scale)  0  Tone (Ashworth scale)  0   Right Lower Extremity:    Left Lower Extremity:    Hip flexors  5-/5   Hip flexors  5-/5   Hip extensors  5/5   Hip extensors  5/5   Knee flexors  5/5   Knee flexors  5/5   Knee extensors  5/5   Knee extensors  5/5   Dorsiflexors  5/5   Dorsiflexors  5/5    Plantarflexors  5/5   Plantarflexors  5/5   Toe extensors  5/5   Toe extensors  5/5   Toe flexors  5/5   Toe flexors  5/5   Tone (Ashworth scale)  0  Tone (Ashworth scale)  0   MSRs:  Right                                                                 Left brachioradialis 2+  brachioradialis 2+  biceps 2+  biceps 2+  triceps 2+  triceps 2+  patellar 2+  patellar 2+  ankle jerk 2+  ankle jerk 2+  Hoffman no  Hoffman no  plantar response down  plantar response down   SENSORY: Reduced vibration at the ankles bilaterally (worse on the left).  Pin prick, temperature, and light touch intact. Romberg's sign absent.   COORDINATION/GAIT: Normal finger-to- nose-finger and heel-to-shin.  Intact rapid alternating movements bilaterally.  Able to rise from a chair without using arms.  Gait narrow based and stable. Tandem and stressed gait intact.    IMPRESSION: [name] [name] is a 59 year-old female referred for evaluation of weakness and imbalance.  Her neurological exam shows mildly reduced hip flexion bilaterally and reduced vibration at the toes.  Although she may have mild neuropathy affecting the feet, this alone, cannot explain her whole body myalgias, knee pain, or leg weakness.  Her imbalance and gait difficulty is likely multifactorial secondary to lumbar spondylosis, early neuropathy, chronic pain, and body habitus.  The nature of her knee pain, described as achy, is more consistent with a musculoskeletal pain.  There is  no neuropathic features as would be expected in a lumbar radiculopathy.  Additionally, recent imaging was reviewed and shows mild foraminal impingement at L4 nerve root on the right, and in my opinion, less likely symptomatic based on her current complaints.   Whole body pain is suggestive of fibromyalgia or pain associated with Ehler's Danlos syndrome.  She may benefit from pain management going forward.  Electrodiagnostic testing of the legs will be ordered, keeping in mind  that her body physiognomy will cause some technical limitations.    She is very worried that she has a worrisome neurodegenerative condition and wants to exclude all possibilities.  I tried to explain that her neurological exam is reassuring, but she is requesting a complete work-up.     PLAN/RECOMMENDATIONS:  1.  Check TSH, vitamin B12, CK, MG panel 2.  EMG of the legs 3.  Start gabapentin 300 twice daily 4.  Return to clinic in 2 months.   The duration of this appointment visit was 45 minutes of face-to-face time with the patient.  Greater than 50% of this time was spent in counseling, explanation of diagnosis, planning of further management, and coordination of care.   Thank you for allowing me to participate in patient's care.  If I can answer any additional questions, I would be pleased to do so.    Sincerely,    Dewitte Vannice K. Allena Katz, DO

## 2015-01-21 LAB — MYASTHENIA GRAVIS PANEL 2
Acetylcholine Rec Binding: <0.3 nmol/L
Acetylcholine Rec Mod Ab: 16 % binding inhibition

## 2015-01-24 ENCOUNTER — Ambulatory Visit (INDEPENDENT_AMBULATORY_CARE_PROVIDER_SITE_OTHER): Payer: BC Managed Care – PPO | Admitting: *Deleted

## 2015-01-24 ENCOUNTER — Ambulatory Visit (INDEPENDENT_AMBULATORY_CARE_PROVIDER_SITE_OTHER): Payer: BC Managed Care – PPO | Admitting: Neurology

## 2015-01-24 DIAGNOSIS — G609 Hereditary and idiopathic neuropathy, unspecified: Secondary | ICD-10-CM

## 2015-01-24 DIAGNOSIS — M797 Fibromyalgia: Secondary | ICD-10-CM

## 2015-01-24 DIAGNOSIS — E538 Deficiency of other specified B group vitamins: Secondary | ICD-10-CM | POA: Diagnosis not present

## 2015-01-24 DIAGNOSIS — R269 Unspecified abnormalities of gait and mobility: Secondary | ICD-10-CM

## 2015-01-24 DIAGNOSIS — R1310 Dysphagia, unspecified: Secondary | ICD-10-CM

## 2015-01-24 DIAGNOSIS — R202 Paresthesia of skin: Secondary | ICD-10-CM

## 2015-01-24 MED ORDER — CYANOCOBALAMIN 1000 MCG/ML IJ SOLN
1000.0000 ug | Freq: Once | INTRAMUSCULAR | Status: AC
  Administered 2015-01-24: 1000 ug via INTRAMUSCULAR

## 2015-01-24 NOTE — Procedures (Signed)
[facility] Neurology  [address], [address]  [place], [place] [address] Tel: [phone] Fax:  [phone] Test Date:  01/24/2015  Patient: [name] [name] DOB: March 31, 1956 Physician: Nita Sickle, DO  Sex: Female Height: 5\' 1"  Ref Phys: Nita Sickle, DO  ID#: [id number]   Technician: Judie Petit. Dean   Patient Complaints: This is a 59 year-old female presenting for evaluation of bilateral feet and legs paresthesias.  NCV & EMG Findings: Extensive electrodiagnostic testing of the right lower extremity and additional studies of the left shows:  1. Bilateral sural and superficial peroneal sensory responses are within normal limits. 2. Bilateral peroneal and tibial motor responses are within normal limits. 3. Right tibial H reflex study is within normal limits. 4. There is no evidence of active or chronic motor axon loss changes affecting any of the tested muscles. Motor unit configuration and recruitment pattern is within normal limits.  Impression: This is a normal study of the lower extremities.   In particular, there is no evidence of a generalized sensorimotor polyneuropathy or right lumbosacral radiculopathy.   ___________________________ Nita Sickle, DO    Nerve Conduction Studies Anti Sensory Summary Table   Site NR Peak (ms) Norm Peak (ms) P-T Amp (V) Norm P-T Amp  Left Sup Peroneal Anti Sensory (Ant Lat Mall)  33.3C  12 cm    3.8 <4.6 17.7 >3  Right Sup Peroneal Anti Sensory (Ant Lat Mall)  33.3C  12 cm    4.0 <4.6 13.1 >3  Left Sural Anti Sensory (Lat Mall)  Calf    3.0 <4.6 8.5 >3  Right Sural Anti Sensory (Lat Mall)  33.3C  Calf    3.0 <4.6 15.9 >3   Motor Summary Table   Site NR Onset (ms) Norm Onset (ms) O-P Amp (mV) Norm O-P Amp Site1 Site2 Delta-0 (ms) Dist (cm) Vel (m/s) Norm Vel (m/s)  Left Peroneal Motor (Ext Dig Brev)  33.3C  Ankle    3.1 <6.0 2.7 >2.5 B Fib Ankle 6.6 32.0 48 >40  B Fib    9.7  2.3  Poplt B Fib 1.6 10.0 62 >40  Poplt    11.3  2.3          Right Peroneal Motor (Ext Dig Brev)  33.3C  Ankle    3.1 <6.0 4.4 >2.5 B Fib Ankle 5.9 30.0 51 >40  B Fib    9.0  3.9  Poplt B Fib 1.9 10.0 53 >40  Poplt    10.9  3.7         Left Tibial Motor (Abd Hall Brev)  33.3C  Ankle    3.3 <6.0 6.5 >4 Knee Ankle 8.5 37.0 44 >40  Knee    11.8  4.5         Right Tibial Motor (Abd Hall Brev)  33.3C  Ankle    3.4 <6.0 7.1 >4 Knee Ankle 8.7 38.0 44 >40  Knee    12.1  5.8          H Reflex Studies   NR H-Lat (ms) Lat Norm (ms) L-R H-Lat (ms)  Right Tibial (Gastroc)  33.3C     31.76 <35 0.82   EMG   Side Muscle Ins Act Fibs Psw Fasc Number Recrt Dur Dur. Amp Amp. Poly Poly. Comment  Right AntTibialis Nml Nml Nml Nml Nml Nml Nml Nml Nml Nml Nml Nml N/A  Right Gastroc Nml Nml Nml Nml Nml Nml Nml Nml Nml Nml Nml Nml N/A  Right Flex Dig  Long Nml Nml Nml Nml Nml Nml Nml Nml Nml Nml Nml Nml N/A  Right RectFemoris Nml Nml Nml Nml Nml Nml Nml Nml Nml Nml Nml Nml N/A  Right GluteusMed Nml Nml Nml Nml Nml Nml Nml Nml Nml Nml Nml Nml N/A  Right BicepsFemS Nml Nml Nml Nml Nml Nml Nml Nml Nml Nml Nml Nml N/A      Waveforms:

## 2015-01-24 NOTE — Progress Notes (Signed)
Patient in for B12 injection. 

## 2015-01-25 ENCOUNTER — Ambulatory Visit (INDEPENDENT_AMBULATORY_CARE_PROVIDER_SITE_OTHER): Payer: BC Managed Care – PPO | Admitting: *Deleted

## 2015-01-25 DIAGNOSIS — E538 Deficiency of other specified B group vitamins: Secondary | ICD-10-CM

## 2015-01-25 MED ORDER — CYANOCOBALAMIN 1000 MCG/ML IJ SOLN
1000.0000 ug | Freq: Once | INTRAMUSCULAR | Status: AC
  Administered 2015-01-25: 1000 ug via INTRAMUSCULAR

## 2015-01-25 NOTE — Progress Notes (Signed)
Patient in for B12 injection. 

## 2015-01-26 ENCOUNTER — Ambulatory Visit (INDEPENDENT_AMBULATORY_CARE_PROVIDER_SITE_OTHER): Payer: BC Managed Care – PPO | Admitting: *Deleted

## 2015-01-26 DIAGNOSIS — E538 Deficiency of other specified B group vitamins: Secondary | ICD-10-CM

## 2015-01-26 MED ORDER — CYANOCOBALAMIN 1000 MCG/ML IJ SOLN
1000.0000 ug | Freq: Once | INTRAMUSCULAR | Status: AC
  Administered 2015-01-26: 1000 ug via INTRAMUSCULAR

## 2015-01-26 NOTE — Progress Notes (Signed)
Patient in for B12 injection. 

## 2015-01-27 ENCOUNTER — Ambulatory Visit (INDEPENDENT_AMBULATORY_CARE_PROVIDER_SITE_OTHER): Payer: BC Managed Care – PPO | Admitting: *Deleted

## 2015-01-27 DIAGNOSIS — E538 Deficiency of other specified B group vitamins: Secondary | ICD-10-CM

## 2015-01-27 MED ORDER — CYANOCOBALAMIN 1000 MCG/ML IJ SOLN
1000.0000 ug | Freq: Once | INTRAMUSCULAR | Status: AC
  Administered 2015-01-27: 1000 ug via INTRAMUSCULAR

## 2015-01-27 NOTE — Progress Notes (Signed)
Patient in for B12 injection. 

## 2015-01-30 ENCOUNTER — Ambulatory Visit: Payer: BC Managed Care – PPO

## 2015-01-31 ENCOUNTER — Ambulatory Visit (INDEPENDENT_AMBULATORY_CARE_PROVIDER_SITE_OTHER): Payer: BC Managed Care – PPO | Admitting: *Deleted

## 2015-01-31 DIAGNOSIS — E538 Deficiency of other specified B group vitamins: Secondary | ICD-10-CM

## 2015-01-31 MED ORDER — CYANOCOBALAMIN 1000 MCG/ML IJ SOLN
1000.0000 ug | Freq: Once | INTRAMUSCULAR | Status: AC
  Administered 2015-01-31: 1000 ug via INTRAMUSCULAR

## 2015-01-31 NOTE — Progress Notes (Signed)
Patient in for B12 injection. 

## 2015-02-01 ENCOUNTER — Ambulatory Visit: Payer: BC Managed Care – PPO | Admitting: *Deleted

## 2015-02-01 DIAGNOSIS — E538 Deficiency of other specified B group vitamins: Secondary | ICD-10-CM

## 2015-02-01 MED ORDER — CYANOCOBALAMIN 1000 MCG/ML IJ SOLN
1000.0000 ug | Freq: Once | INTRAMUSCULAR | Status: AC
  Administered 2015-02-01: 1000 ug via INTRAMUSCULAR

## 2015-02-01 NOTE — Progress Notes (Signed)
Patient in for B12 injection. 

## 2015-02-03 ENCOUNTER — Ambulatory Visit (INDEPENDENT_AMBULATORY_CARE_PROVIDER_SITE_OTHER): Payer: BC Managed Care – PPO | Admitting: Neurology

## 2015-02-03 DIAGNOSIS — E538 Deficiency of other specified B group vitamins: Secondary | ICD-10-CM | POA: Diagnosis not present

## 2015-02-03 MED ORDER — CYANOCOBALAMIN 1000 MCG/ML IJ SOLN
1000.0000 ug | Freq: Once | INTRAMUSCULAR | Status: AC
  Administered 2015-02-03: 1000 ug via INTRAMUSCULAR

## 2015-02-03 NOTE — Progress Notes (Signed)
B12 injection to left deltoid with no apparent complications.   

## 2015-02-07 ENCOUNTER — Ambulatory Visit: Payer: BC Managed Care – PPO

## 2015-02-08 ENCOUNTER — Ambulatory Visit (INDEPENDENT_AMBULATORY_CARE_PROVIDER_SITE_OTHER): Payer: BC Managed Care – PPO | Admitting: *Deleted

## 2015-02-08 DIAGNOSIS — E538 Deficiency of other specified B group vitamins: Secondary | ICD-10-CM

## 2015-02-08 MED ORDER — CYANOCOBALAMIN 1000 MCG/ML IJ SOLN
1000.0000 ug | Freq: Once | INTRAMUSCULAR | Status: AC
  Administered 2015-02-08: 1000 ug via INTRAMUSCULAR

## 2015-02-08 NOTE — Progress Notes (Signed)
Patient in for B12 injection. 

## 2015-02-15 ENCOUNTER — Ambulatory Visit (INDEPENDENT_AMBULATORY_CARE_PROVIDER_SITE_OTHER): Payer: BC Managed Care – PPO | Admitting: *Deleted

## 2015-02-15 DIAGNOSIS — E538 Deficiency of other specified B group vitamins: Secondary | ICD-10-CM | POA: Diagnosis not present

## 2015-02-15 MED ORDER — CYANOCOBALAMIN 1000 MCG/ML IJ SOLN
1000.0000 ug | Freq: Once | INTRAMUSCULAR | Status: AC
  Administered 2015-02-15: 1000 ug via INTRAMUSCULAR

## 2015-02-15 NOTE — Progress Notes (Signed)
Patient in for B12 injection. 

## 2015-02-23 ENCOUNTER — Ambulatory Visit (INDEPENDENT_AMBULATORY_CARE_PROVIDER_SITE_OTHER): Payer: BC Managed Care – PPO | Admitting: *Deleted

## 2015-02-23 DIAGNOSIS — E538 Deficiency of other specified B group vitamins: Secondary | ICD-10-CM

## 2015-02-23 MED ORDER — CYANOCOBALAMIN 1000 MCG/ML IJ SOLN
1000.0000 ug | Freq: Once | INTRAMUSCULAR | Status: AC
  Administered 2015-02-23: 1000 ug via INTRAMUSCULAR

## 2015-02-23 NOTE — Progress Notes (Signed)
Patient in for B12 injection. 

## 2015-03-02 ENCOUNTER — Ambulatory Visit: Payer: BC Managed Care – PPO

## 2015-03-02 ENCOUNTER — Encounter: Payer: Self-pay | Admitting: Internal Medicine

## 2015-03-02 ENCOUNTER — Ambulatory Visit (INDEPENDENT_AMBULATORY_CARE_PROVIDER_SITE_OTHER): Payer: BC Managed Care – PPO | Admitting: Internal Medicine

## 2015-03-02 VITALS — BP 150/90 | HR 106 | Temp 98.9°F | Resp 12 | Ht 61.5 in | Wt 251.0 lb

## 2015-03-02 DIAGNOSIS — M79604 Pain in right leg: Secondary | ICD-10-CM | POA: Insufficient documentation

## 2015-03-02 DIAGNOSIS — Z23 Encounter for immunization: Secondary | ICD-10-CM

## 2015-03-02 MED ORDER — PREGABALIN 100 MG PO CAPS: 100.0000 mg | ORAL_CAPSULE | Freq: Two times a day (BID) | ORAL | Status: DC

## 2015-03-02 MED ORDER — SOLIFENACIN SUCCINATE 5 MG PO TABS: 5.0000 mg | ORAL_TABLET | Freq: Every day | ORAL | Status: DC

## 2015-03-02 NOTE — Progress Notes (Signed)
Pre visit review using our clinic review tool, if applicable. No additional management support is needed unless otherwise documented below in the visit note. 

## 2015-03-02 NOTE — Patient Instructions (Signed)
We will check the ultrasound of the legs to make sure there is not a blood clot or a baker's cyst on the right. We will ultrasound both legs.   We have given you the prescription for lyrica which may help with this pain more than the gabapentin did. You can take 1 pill twice a day for pain as needed.   Baker Cyst A Baker cyst is a sac-like structure that forms in the back of the knee. It is filled with the same fluid that is located in your knee. This fluid lubricates the bones and cartilage of the knee and allows them to move over each other more easily. CAUSES  When the knee becomes injured or inflamed, increased fluid forms in the knee. When this happens, the joint lining is pushed out behind the knee and forms the Baker cyst. This cyst may also be caused by inflammation from arthritic conditions and infections. SIGNS AND SYMPTOMS  A Baker cyst usually has no symptoms. When the cyst is substantially enlarged:  You may feel pressure behind the knee, stiffness in the knee, or a mass in the area behind the knee.  You may develop pain, redness, and swelling in the calf. This can suggest a blood clot and requires evaluation by your health care provider. DIAGNOSIS  A Baker cyst is most often found during an ultrasound exam. This exam may have been performed for other reasons, and the cyst was found incidentally. Sometimes an MRI is used. This picks up other problems within a joint that an ultrasound exam may not. If the Baker cyst developed immediately after an injury, X-ray exams may be used to diagnose the cyst. TREATMENT  The treatment depends on the cause of the cyst. Anti-inflammatory medicines and rest often will be prescribed. If the cyst is caused by a bacterial infection, antibiotic medicines may be prescribed.  HOME CARE INSTRUCTIONS   If the cyst was caused by an injury, for the first 24 hours, keep the injured leg elevated on 2 pillows while lying down.  For the first 24 hours while  you are awake, apply ice to the injured area:  Put ice in a plastic bag.  Place a towel between your skin and the bag.  Leave the ice on for 20 minutes, 2-3 times a day.  Only take over-the-counter or prescription medicines for pain, discomfort, or fever as directed by your health care provider.  Only take antibiotic medicine as directed. Make sure to finish it even if you start to feel better. MAKE SURE YOU:   Understand these instructions.  Will watch your condition.  Will get help right away if you are not doing well or get worse. Document Released: 06/03/2005 Document Revised: 03/24/2013 Document Reviewed: 01/13/2013 [facility] Patient Information 2015 [place], [place]. This information is not intended to replace advice given to you by your health care provider. Make sure you discuss any questions you have with your health care provider.

## 2015-03-02 NOTE — Progress Notes (Signed)
   Subjective:    Patient ID: [name] [name], female    DOB: [birth year], 59 y.o.   MRN: [id number]  HPI The patient is a 59 YO female coming in for worsening of right leg pain. She does have known arthritis in the knee but this pain is different and more severe. It starts in the back of the knee and radiates down into the shin and around the leg. Brought on by pressure from standing. Relieved only with hours of rest without pressure on the leg. She has tried some hydrocodone which did not help, some gabapentin which did not help. She has had nerve conduction study with neurology which was normal. They are contemplating a spinal injection for the pain. Her other option is knee replacement if they are unable to find a cause but this will be a complicated procedure since she already has a partial there.   Review of Systems  Constitutional: Negative for fever, activity change, appetite change and fatigue.  HENT: Negative.   Eyes: Negative.   Respiratory: Negative for cough, chest tightness, shortness of breath and wheezing.   Cardiovascular: Negative for chest pain, palpitations and leg swelling.  Gastrointestinal: Negative for diarrhea and constipation.  Musculoskeletal: Positive for back pain, arthralgias and gait problem. Negative for myalgias.  Skin: Negative.   Neurological: Negative for dizziness, syncope, weakness, light-headedness and headaches.  Psychiatric/Behavioral: Negative.       Objective:   Physical Exam  Constitutional: She is oriented to person, place, and time. She appears well-developed and well-nourished.  Obese  HENT:  Head: Normocephalic and atraumatic.  Eyes: EOM are normal.  Neck: Normal range of motion.  Cardiovascular: Normal rate and regular rhythm.   Pulmonary/Chest: Effort normal and breath sounds normal. No respiratory distress. She has no wheezes. She has no rales.  Abdominal: Soft. Bowel sounds are normal. She exhibits no distension. There is no tenderness.  There is no rebound.  Musculoskeletal:  Tender at the back of the knee, swelling of both legs and ankles, left greater than right.   Neurological: She is alert and oriented to person, place, and time. Coordination normal.  Skin: Skin is warm and dry.   Filed Vitals:   03/02/15 1118  BP: 150/90  Pulse: 106  Temp: 98.9 F (37.2 C)  TempSrc: Oral  Resp: 12  Height: 5' 1.5" (1.562 m)  Weight: 251 lb (113.853 kg)  SpO2: 98%      Assessment & Plan:  Flu shot given at visit.

## 2015-03-02 NOTE — Assessment & Plan Note (Signed)
Concern for baker cyst in the right knee or DVT in either leg as she has been essentially immobile with this pain. Will try lyrica instead of gabapentin for the pain. If no DVT or baker cyst would go ahead with spinal injection for relief.

## 2015-03-09 ENCOUNTER — Ambulatory Visit (INDEPENDENT_AMBULATORY_CARE_PROVIDER_SITE_OTHER): Payer: BC Managed Care – PPO | Admitting: *Deleted

## 2015-03-09 DIAGNOSIS — E538 Deficiency of other specified B group vitamins: Secondary | ICD-10-CM | POA: Diagnosis not present

## 2015-03-09 MED ORDER — CYANOCOBALAMIN 1000 MCG/ML IJ SOLN
1000.0000 ug | Freq: Once | INTRAMUSCULAR | Status: AC
  Administered 2015-03-09: 1000 ug via INTRAMUSCULAR

## 2015-03-09 NOTE — Progress Notes (Signed)
Patient in for B12 injection. 

## 2015-03-16 ENCOUNTER — Ambulatory Visit (INDEPENDENT_AMBULATORY_CARE_PROVIDER_SITE_OTHER): Payer: BC Managed Care – PPO | Admitting: *Deleted

## 2015-03-16 DIAGNOSIS — E538 Deficiency of other specified B group vitamins: Secondary | ICD-10-CM | POA: Diagnosis not present

## 2015-03-16 MED ORDER — CYANOCOBALAMIN 1000 MCG/ML IJ SOLN
1000.0000 ug | Freq: Once | INTRAMUSCULAR | Status: AC
  Administered 2015-03-16: 1000 ug via INTRAMUSCULAR

## 2015-03-16 NOTE — Progress Notes (Signed)
Patient in for B12 injection. 

## 2015-04-14 ENCOUNTER — Ambulatory Visit (INDEPENDENT_AMBULATORY_CARE_PROVIDER_SITE_OTHER): Payer: BC Managed Care – PPO

## 2015-04-14 DIAGNOSIS — E538 Deficiency of other specified B group vitamins: Secondary | ICD-10-CM

## 2015-04-14 MED ORDER — CYANOCOBALAMIN 1000 MCG/ML IJ SOLN
1000.0000 ug | Freq: Once | INTRAMUSCULAR | Status: AC
  Administered 2015-04-14: 1000 ug via INTRAMUSCULAR

## 2015-04-14 NOTE — Progress Notes (Signed)
Pt presented to clinic for B12 injection. Administered in left deltoid. Tolerated well. Will set up next month's injection prior to leaving.  

## 2015-04-24 ENCOUNTER — Encounter: Payer: Self-pay | Admitting: Neurology

## 2015-04-24 ENCOUNTER — Ambulatory Visit (INDEPENDENT_AMBULATORY_CARE_PROVIDER_SITE_OTHER): Payer: BC Managed Care – PPO | Admitting: Neurology

## 2015-04-24 VITALS — BP 130/84 | HR 102 | Wt 256.4 lb

## 2015-04-24 DIAGNOSIS — M797 Fibromyalgia: Secondary | ICD-10-CM

## 2015-04-24 DIAGNOSIS — R269 Unspecified abnormalities of gait and mobility: Secondary | ICD-10-CM | POA: Diagnosis not present

## 2015-04-24 DIAGNOSIS — E538 Deficiency of other specified B group vitamins: Secondary | ICD-10-CM

## 2015-04-24 NOTE — Progress Notes (Signed)
Follow-up Visit   Date: 04/24/2015    [name] MusterSharon M [name] MRN: [id number] DOB: 1955-08-12   Interim History: [name] [name] is a 59 y.o. right-handed Caucasian female with hypertension, depression, Ehler's Danlos syndrome type III, hyperlipiemia, morbid obesity, and history of L4-5 discectomy and decompression of right L4 nerve root and cervical diskectomy and fusion at C6-7 returning to the clinic for follow-up of myalgias.  The patient was accompanied to the clinic by self.  History of present illness: She has a long history of generalized wholly body pain. Initially this was diagnosed as fibromyalgia and later once she was found to have Ehler's Danlos syndrome, was attributed to pain related to the disease process.  Around early 2016, she started having worsening problems with her knees, described as locking up. She has associated achy pain of her knees. Pain is improved when resting and worse with activity and standing. Pain does not radiate and remains localized to her knees. There is no numbness/tingling of the legs. She does not have any radiating back from her low back into her legs. She feels that her legs are weak because she cannot climb stairs. She has not done any physical therapy.  She complains of generalized myalgias. She also complains weakness of her arms.   She has been using a cane for the past 15 years for her joint pain and balance problems. She has fallen twice this year. She complains of numbness/tingling in feet.   She complains of dysphagia and reports having esophageal stricture. She also complains of blurry vision.   She is worried that she has a muscular dystrophy.   UPDATE 04/24/2015:  She has not noticed any difference since taking gabapentin 300mg  BID and no change in her pain since starting vitamin B12.  She cannot walk greater than 10-minutes at time because of pain.  She had a lumbar ESI which did not help.  She has been reluctant to do PT  for gait training in the past.  She is worried about rheumatoid arthritis but has been checked for this several times in the past.   Labs notable for vitamin B12 deficiency and mild elevation in CK. Her EMG returned normal.     Medications:  Current Outpatient Prescriptions on File Prior to Visit  Medication Sig Dispense Refill  . albuterol (PROVENTIL HFA;VENTOLIN HFA) 108 (90 BASE) MCG/ACT inhaler Inhale 2 puffs into the lungs every 6 (six) hours as needed for wheezing. 1 Inhaler 0  . ALPRAZolam (XANAX) 0.5 MG tablet Take 1 tablet (0.5 mg total) by mouth at bedtime as needed for anxiety. 30 tablet 1  . atorvastatin (LIPITOR) 40 MG tablet Take 1 tablet (40 mg total) by mouth daily. 90 tablet 3  . Fluticasone-Salmeterol (ADVAIR) 100-50 MCG/DOSE AEPB Inhale 1 puff into the lungs 2 (two) times daily. 1 each 3  . furosemide (LASIX) 40 MG tablet Take 1 tablet (40 mg total) by mouth daily. 30 tablet 3  . gabapentin (NEURONTIN) 300 MG capsule Take 1 capsule (300 mg total) by mouth 2 (two) times daily. 60 capsule 5  . pregabalin (LYRICA) 100 MG capsule Take 1 capsule (100 mg total) by mouth 2 (two) times daily. 60 capsule 2  . solifenacin (VESICARE) 5 MG tablet Take 1 tablet (5 mg total) by mouth daily. take 1 tablet by mouth once daily 30 tablet 6  . valsartan (DIOVAN) 320 MG tablet take 1 tablet by mouth once daily 30 tablet 5  . venlafaxine XR (EFFEXOR-XR) 150 MG  24 hr capsule Take 1 capsule (150 mg total) by mouth daily. 90 capsule 3   No current facility-administered medications on file prior to visit.    Allergies: No Known Allergies  Review of Systems:  CONSTITUTIONAL: No fevers, chills, night sweats, or weight loss.  EYES: No visual changes or eye pain ENT: No hearing changes.  No history of nose bleeds.   RESPIRATORY: No cough, wheezing and shortness of breath.   CARDIOVASCULAR: Negative for chest pain, and palpitations.   GI: Negative for abdominal discomfort, blood in stools or black  stools.  No recent change in bowel habits.   GU:  No history of incontinence.   MUSCLOSKELETAL: +history of joint pain or swelling.  +myalgias.   SKIN: Negative for lesions, rash, and itching.   ENDOCRINE: Negative for cold or heat intolerance, polydipsia or goiter.   PSYCH:  + depression or anxiety symptoms.   NEURO: As Above.   Vital Signs:  BP 130/84 mmHg  Pulse 102  Wt 256 lb 7 oz (116.319 kg)  SpO2 96%  Neurological Exam: MENTAL STATUS including orientation to time, place, person, recent and remote memory, attention span and concentration, language, and fund of knowledge is normal.  Speech is not dysarthric.  CRANIAL NERVES: Pupils equal round and reactive to light.  Normal conjugate, extra-ocular eye movements in all directions of gaze.  Face is symmetric. Palate elevates symmetrically.  Tongue is midline.  MOTOR:  Motor strength is 5/5 in all extremities.  No atrophy, fasciculations or abnormal movements.   MSRs:  Reflexes are 2+/4 throughout  SENSORY:  Intact to vibration throughout.  COORDINATION/GAIT:  Gait appears wide-based and stable when assisted with cane, turns in 1 step.  Data: MRI lumbar spine 11/26/2014: L2-3: Disc bulge but no neural compression. L3-4: Disc bulge but no neural compression. Discogenic edema on the right could be associated with back pain. L4-5: Disc bulge. Mild facet degeneration. Foraminal narrowing on the right that could possibly affect the exiting L4 nerve root. L5-S1: Bilateral facet degeneration that could be a cause of back pain or referred facet syndrome pain.  MRI cervical spine 11/21/2008: Spondylosis most notable C3-C4 and C6-C7. Disc osteophyte complex narrows the left C3-C4 and the left C6-C7 foramina. This is similar in appearance to the prior examination.  NCS/EMG of the legs 01/24/2015: This is a normal study of the lower extremities.   In particular, there is no evidence of a generalized sensorimotor polyneuropathy or right  lumbosacral radiculopathy.  Labs 01/16/2015:  CK 192, TSH 1.81, vitamin B12 190*, MG panel negative  IMPRESSION/PLAN: 1.  Generalized myalgias, most consistent with fibromyalgia which is followed by her PCP. Mild elevation in CK is unlikely to represent anything worrisome, especially as EMG did not demonstrate myopathic findings.  I am not sure if gabapentin is really helping, so recommend that she reduce it to  BID for one week and then stop. 2.  Vitamin B12 deficiency, continue monthly injections 3.  Polyarthralgias, worse in the knees.  She is followed by ortho and hoping to undergo knee surgery.  She remains very concerned about rheumatoid arthritis and will discuss rheumatology referral with PCP 4.  Gait abnormality due low back and knee pain, recommend PT but she would like to think about this.  Return to clinic as needed   The duration of this appointment visit was 25 minutes of face-to-face time with the patient.  Greater than 50% of this time was spent in counseling, explanation of diagnosis, planning of further  management, and coordination of care.   Thank you for allowing me to participate in patient's care.  If I can answer any additional questions, I would be pleased to do so.    Sincerely,    Donika K. Posey Pronto, DO

## 2015-04-24 NOTE — Patient Instructions (Addendum)
1.  If you change your mind about physical therapy, please call my office and we can send a referral.  In the meantime, keep using the cane for support. 2.  Discuss with your primary care doctor about rheumatology referral 3.  Continue gabapentin for now, if you feel that it is not helping, ok to stop it over two weeks. 4.  Continue monthly B12 injections   Return to clinic as needed

## 2015-05-15 ENCOUNTER — Ambulatory Visit (INDEPENDENT_AMBULATORY_CARE_PROVIDER_SITE_OTHER): Payer: BC Managed Care – PPO | Admitting: *Deleted

## 2015-05-15 DIAGNOSIS — E538 Deficiency of other specified B group vitamins: Secondary | ICD-10-CM

## 2015-05-15 MED ORDER — CYANOCOBALAMIN 1000 MCG/ML IJ SOLN
1000.0000 ug | Freq: Once | INTRAMUSCULAR | Status: AC
  Administered 2015-05-15: 1000 ug via INTRAMUSCULAR

## 2015-05-15 NOTE — Progress Notes (Signed)
Patient in for B12 injection. 

## 2015-06-14 ENCOUNTER — Ambulatory Visit (INDEPENDENT_AMBULATORY_CARE_PROVIDER_SITE_OTHER): Payer: BC Managed Care – PPO | Admitting: *Deleted

## 2015-06-14 DIAGNOSIS — E538 Deficiency of other specified B group vitamins: Secondary | ICD-10-CM | POA: Diagnosis not present

## 2015-06-14 MED ORDER — CYANOCOBALAMIN 1000 MCG/ML IJ SOLN
1000.0000 ug | Freq: Once | INTRAMUSCULAR | Status: AC
  Administered 2015-06-14: 1000 ug via INTRAMUSCULAR

## 2015-06-14 NOTE — Progress Notes (Signed)
Patient in for B12 injection. 

## 2015-06-18 HISTORY — PX: GASTROPLASTY DUODENAL SWITCH: SHX1699

## 2015-07-17 ENCOUNTER — Ambulatory Visit: Payer: BC Managed Care – PPO

## 2015-07-25 ENCOUNTER — Other Ambulatory Visit: Payer: Self-pay | Admitting: Specialist

## 2015-07-31 ENCOUNTER — Ambulatory Visit
Admission: RE | Admit: 2015-07-31 | Discharge: 2015-07-31 | Disposition: A | Discharge status: Home / Self Care | Payer: BC Managed Care – PPO | Source: Ambulatory Visit | Attending: Specialist | Admitting: Specialist

## 2015-07-31 DIAGNOSIS — Z01818 Encounter for other preprocedural examination: Secondary | ICD-10-CM | POA: Insufficient documentation

## 2015-08-02 ENCOUNTER — Ambulatory Visit
Admission: RE | Admit: 2015-08-02 | Discharge: 2015-08-02 | Disposition: A | Discharge status: Home / Self Care | Payer: BC Managed Care – PPO | Source: Ambulatory Visit | Attending: Specialist | Admitting: Specialist

## 2015-08-02 DIAGNOSIS — K760 Fatty (change of) liver, not elsewhere classified: Secondary | ICD-10-CM | POA: Diagnosis not present

## 2015-08-02 DIAGNOSIS — K8020 Calculus of gallbladder without cholecystitis without obstruction: Secondary | ICD-10-CM | POA: Insufficient documentation

## 2015-08-02 DIAGNOSIS — Z01818 Encounter for other preprocedural examination: Secondary | ICD-10-CM | POA: Insufficient documentation

## 2015-08-30 ENCOUNTER — Encounter: Payer: Self-pay | Admitting: Internal Medicine

## 2015-08-30 ENCOUNTER — Ambulatory Visit (INDEPENDENT_AMBULATORY_CARE_PROVIDER_SITE_OTHER): Payer: BC Managed Care – PPO | Admitting: Internal Medicine

## 2015-08-30 VITALS — BP 142/100 | HR 98 | Temp 98.3°F | Resp 14 | Ht 61.5 in | Wt 252.1 lb

## 2015-08-30 DIAGNOSIS — E213 Hyperparathyroidism, unspecified: Secondary | ICD-10-CM

## 2015-08-30 DIAGNOSIS — I10 Essential (primary) hypertension: Secondary | ICD-10-CM

## 2015-08-30 DIAGNOSIS — Z418 Encounter for other procedures for purposes other than remedying health state: Secondary | ICD-10-CM | POA: Diagnosis not present

## 2015-08-30 DIAGNOSIS — Z23 Encounter for immunization: Secondary | ICD-10-CM | POA: Diagnosis not present

## 2015-08-30 DIAGNOSIS — Z299 Encounter for prophylactic measures, unspecified: Secondary | ICD-10-CM

## 2015-08-30 MED ORDER — ALPRAZOLAM 0.5 MG PO TABS: 0.5000 mg | ORAL_TABLET | Freq: Every evening | ORAL | Status: DC | PRN

## 2015-08-30 NOTE — Progress Notes (Signed)
   Subjective:    Patient ID: [name] [name], female    DOB: [birth year], 60 y.o.   MRN: [id number]  HPI The patient is a 60 YO female coming in for follow up of her medical problems. She is being evaluated for weight loss surgery. They have found several abnormalities during that process. No problems with her breathing. Still having joint pains. Her parathyroid numbers were high but she has never had problems with calcium levels in the past. Gets bone density which has always been normal. No fractures. Is not 100% sure about the surgery but needs to lose weight so she can get her knee replacement and has not been successful on her own.    Review of Systems  Constitutional: Negative for fever, activity change, appetite change and fatigue.  HENT: Negative.   Eyes: Negative.   Respiratory: Negative for cough, chest tightness, shortness of breath and wheezing.   Cardiovascular: Negative for chest pain, palpitations and leg swelling.  Gastrointestinal: Negative for diarrhea and constipation.  Musculoskeletal: Positive for back pain, arthralgias and gait problem. Negative for myalgias.  Skin: Negative.   Neurological: Negative.       Objective:   Physical Exam  Constitutional: She is oriented to person, place, and time. She appears well-developed and well-nourished.  Obese  HENT:  Head: Normocephalic and atraumatic.  Eyes: EOM are normal.  Neck: Normal range of motion.  Cardiovascular: Normal rate and regular rhythm.   Pulmonary/Chest: Effort normal and breath sounds normal. No respiratory distress. She has no wheezes. She has no rales.  Abdominal: Soft. Bowel sounds are normal. She exhibits no distension. There is no tenderness. There is no rebound.  Neurological: She is alert and oriented to person, place, and time. Coordination normal.  Skin: Skin is warm and dry.  Psychiatric: She has a normal mood and affect.   Filed Vitals:   08/30/15 1017  BP: 142/100  Pulse: 98  Temp: 98.3 F  (36.8 C)  TempSrc: Oral  Resp: 14  Height: 5' 1.5" (1.562 m)  Weight: 252 lb 1.9 oz (114.361 kg)  SpO2: 97%      Assessment & Plan:  Shingles shot given at visit.

## 2015-08-30 NOTE — Progress Notes (Signed)
Pre visit review using our clinic review tool, if applicable. No additional management support is needed unless otherwise documented below in the visit note. 

## 2015-08-30 NOTE — Patient Instructions (Signed)
We have refilled the medicines and will send in the xanax.   Come back later this year for a physical and if you needs any forms filled out for the bariatric center I would be happy to do that for you.

## 2015-08-31 DIAGNOSIS — E213 Hyperparathyroidism, unspecified: Secondary | ICD-10-CM | POA: Insufficient documentation

## 2015-08-31 NOTE — Assessment & Plan Note (Signed)
BP normally not elevated and will observe for now. Taking lasix and valsartan. No headaches or chest pains.

## 2015-08-31 NOTE — Assessment & Plan Note (Signed)
She is still working on weight loss and may be getting a bariatric procedure soon. They found gallstones during their workup.

## 2015-08-31 NOTE — Assessment & Plan Note (Signed)
Possible diagnosis although I am skeptical. She has never had elevated calcium levels on past labs so a mildly elevated PTH would not make this diagnosis. Will request records from the bariatric center to see what they found. She has had bone density which is normal so further treatment would not be required at this time.

## 2015-10-31 ENCOUNTER — Other Ambulatory Visit: Payer: Self-pay

## 2015-10-31 MED ORDER — VALSARTAN 320 MG PO TABS: 320.0000 mg | ORAL_TABLET | Freq: Every day | ORAL | Status: DC

## 2015-11-10 ENCOUNTER — Other Ambulatory Visit: Payer: Self-pay | Admitting: Internal Medicine

## 2015-11-14 NOTE — Telephone Encounter (Signed)
Faxed to pharmacy

## 2015-12-04 ENCOUNTER — Other Ambulatory Visit: Payer: Self-pay | Admitting: *Deleted

## 2015-12-04 MED ORDER — ATORVASTATIN CALCIUM 40 MG PO TABS: 40.0000 mg | ORAL_TABLET | Freq: Every day | ORAL | Status: DC

## 2016-01-31 ENCOUNTER — Encounter: Payer: Self-pay | Admitting: *Deleted

## 2016-03-01 ENCOUNTER — Encounter: Payer: Self-pay | Admitting: Internal Medicine

## 2016-03-01 ENCOUNTER — Ambulatory Visit (INDEPENDENT_AMBULATORY_CARE_PROVIDER_SITE_OTHER): Payer: BC Managed Care – PPO | Admitting: Internal Medicine

## 2016-03-01 VITALS — BP 140/90 | HR 80 | Temp 98.2°F | Ht 61.5 in | Wt 234.0 lb

## 2016-03-01 DIAGNOSIS — Z23 Encounter for immunization: Secondary | ICD-10-CM | POA: Diagnosis not present

## 2016-03-01 DIAGNOSIS — I10 Essential (primary) hypertension: Secondary | ICD-10-CM | POA: Diagnosis not present

## 2016-03-01 DIAGNOSIS — Z0000 Encounter for general adult medical examination without abnormal findings: Secondary | ICD-10-CM

## 2016-03-01 DIAGNOSIS — Z1159 Encounter for screening for other viral diseases: Secondary | ICD-10-CM

## 2016-03-01 DIAGNOSIS — J4530 Mild persistent asthma, uncomplicated: Secondary | ICD-10-CM

## 2016-03-01 NOTE — Assessment & Plan Note (Signed)
S/P weight loss reduction surgery. Checking labs in about 1-2 months. She is off cholesterol meds for now and adjust as needed.

## 2016-03-01 NOTE — Progress Notes (Signed)
   Subjective:    Patient ID: [name] [name], female    DOB: [birth year], 60 y.o.   MRN: [id number]  HPI The patient is a 60 YO female coming in for wellness. Weight loss surgery done in August and she is feeling good from that.   [facility], [facility], social history reviewed and updated.   Review of Systems  Constitutional: Negative for activity change, appetite change, fatigue and fever.  HENT: Negative.   Eyes: Negative.   Respiratory: Negative for cough, chest tightness, shortness of breath and wheezing.   Cardiovascular: Negative for chest pain, palpitations and leg swelling.  Gastrointestinal: Negative for constipation and diarrhea.  Musculoskeletal: Positive for arthralgias. Negative for back pain, gait problem and myalgias.  Skin: Negative.   Neurological: Negative.   Psychiatric/Behavioral: Negative.       Objective:   Physical Exam  Constitutional: She is oriented to person, place, and time. She appears well-developed and well-nourished.  Obese  HENT:  Head: Normocephalic and atraumatic.  Eyes: EOM are normal.  Neck: Normal range of motion.  Cardiovascular: Normal rate and regular rhythm.   Pulmonary/Chest: Effort normal and breath sounds normal. No respiratory distress. She has no wheezes. She has no rales.  Abdominal: Soft. Bowel sounds are normal. She exhibits no distension. There is no tenderness. There is no rebound.  Well healed incisions abdomen from recent surgery  Neurological: She is alert and oriented to person, place, and time. Coordination normal.  Skin: Skin is warm and dry.  Psychiatric: She has a normal mood and affect.   Vitals:   03/01/16 1150  BP: 140/90  Pulse: 80  Temp: 98.2 F (36.8 C)  TempSrc: Oral  SpO2: 98%  Weight: 234 lb (106.1 kg)  Height: 5' 1.5" (1.562 m)      Assessment & Plan:  Flu shot given at visit

## 2016-03-01 NOTE — Assessment & Plan Note (Signed)
No flare today, talked about allergy precautions. Better since she has been mostly indoors recovering from surgery.

## 2016-03-01 NOTE — Assessment & Plan Note (Signed)
She is off all meds right now and BP mildly elevated. Will continue to monitor with further weight loss. If needed in 1-2 months will resume lower dose medication.

## 2016-03-01 NOTE — Assessment & Plan Note (Signed)
Checking labs soon, colonoscopy overdue and she declines right now given recent bowel surgery. Given flu shot today. Mammogram up to date. Given screening recommendations. Counseled about sun safety and exercise guidelines.

## 2016-03-01 NOTE — Patient Instructions (Signed)
Stay off the cholesterol medicine.   We have put in labs and you can plan to come get those draw in late October.   As you can, get back into the mild exercise.   Health Maintenance, Female Adopting a healthy lifestyle and getting preventive care can go a long way to promote health and wellness. Talk with your health care provider about what schedule of regular examinations is right for you. This is a good chance for you to check in with your provider about disease prevention and staying healthy. In between checkups, there are plenty of things you can do on your own. Experts have done a lot of research about which lifestyle changes and preventive measures are most likely to keep you healthy. Ask your health care provider for more information. WEIGHT AND DIET  Eat a healthy diet  Be sure to include plenty of vegetables, fruits, low-fat dairy products, and lean protein.  Do not eat a lot of foods high in solid fats, added sugars, or salt.  Get regular exercise. This is one of the most important things you can do for your health.  Most adults should exercise for at least 150 minutes each week. The exercise should increase your heart rate and make you sweat (moderate-intensity exercise).  Most adults should also do strengthening exercises at least twice a week. This is in addition to the moderate-intensity exercise.  Maintain a healthy weight  Body mass index (BMI) is a measurement that can be used to identify possible weight problems. It estimates body fat based on height and weight. Your health care provider can help determine your BMI and help you achieve or maintain a healthy weight.  For females 16 years of age and older:   A BMI below 18.5 is considered underweight.  A BMI of 18.5 to 24.9 is normal.  A BMI of 25 to 29.9 is considered overweight.  A BMI of 30 and above is considered obese.  Watch levels of cholesterol and blood lipids  You should start having your blood tested  for lipids and cholesterol at 61 years of age, then have this test every 5 years.  You may need to have your cholesterol levels checked more often if:  Your lipid or cholesterol levels are high.  You are older than 61 years of age.  You are at high risk for heart disease.  CANCER SCREENING   Lung Cancer  Lung cancer screening is recommended for adults 87-9 years old who are at high risk for lung cancer because of a history of smoking.  A yearly low-dose CT scan of the lungs is recommended for people who:  Currently smoke.  Have quit within the past 15 years.  Have at least a 30-pack-year history of smoking. A pack year is smoking an average of one pack of cigarettes a day for 1 year.  Yearly screening should continue until it has been 15 years since you quit.  Yearly screening should stop if you develop a health problem that would prevent you from having lung cancer treatment.  Breast Cancer  Practice breast self-awareness. This means understanding how your breasts normally appear and feel.  It also means doing regular breast self-exams. Let your health care provider know about any changes, no matter how small.  If you are in your 20s or 30s, you should have a clinical breast exam (CBE) by a health care provider every 1-3 years as part of a regular health exam.  If you are 40 or  older, have a CBE every year. Also consider having a breast X-ray (mammogram) every year.  If you have a family history of breast cancer, talk to your health care provider about genetic screening.  If you are at high risk for breast cancer, talk to your health care provider about having an MRI and a mammogram every year.  Breast cancer gene (BRCA) assessment is recommended for women who have family members with BRCA-related cancers. BRCA-related cancers include:  Breast.  Ovarian.  Tubal.  Peritoneal cancers.  Results of the assessment will determine the need for genetic counseling and  BRCA1 and BRCA2 testing. Cervical Cancer Your health care provider may recommend that you be screened regularly for cancer of the pelvic organs (ovaries, uterus, and vagina). This screening involves a pelvic examination, including checking for microscopic changes to the surface of your cervix (Pap test). You may be encouraged to have this screening done every 3 years, beginning at age 94.  For women ages 46-65, health care providers may recommend pelvic exams and Pap testing every 3 years, or they may recommend the Pap and pelvic exam, combined with testing for human papilloma virus (HPV), every 5 years. Some types of HPV increase your risk of cervical cancer. Testing for HPV may also be done on women of any age with unclear Pap test results.  Other health care providers may not recommend any screening for nonpregnant women who are considered low risk for pelvic cancer and who do not have symptoms. Ask your health care provider if a screening pelvic exam is right for you.  If you have had past treatment for cervical cancer or a condition that could lead to cancer, you need Pap tests and screening for cancer for at least 20 years after your treatment. If Pap tests have been discontinued, your risk factors (such as having a new sexual partner) need to be reassessed to determine if screening should resume. Some women have medical problems that increase the chance of getting cervical cancer. In these cases, your health care provider may recommend more frequent screening and Pap tests. Colorectal Cancer  This type of cancer can be detected and often prevented.  Routine colorectal cancer screening usually begins at 61 years of age and continues through 61 years of age.  Your health care provider may recommend screening at an earlier age if you have risk factors for colon cancer.  Your health care provider may also recommend using home test kits to check for hidden blood in the stool.  A small camera at  the end of a tube can be used to examine your colon directly (sigmoidoscopy or colonoscopy). This is done to check for the earliest forms of colorectal cancer.  Routine screening usually begins at age 70.  Direct examination of the colon should be repeated every 5-10 years through 61 years of age. However, you may need to be screened more often if early forms of precancerous polyps or small growths are found. Skin Cancer  Check your skin from head to toe regularly.  Tell your health care provider about any new moles or changes in moles, especially if there is a change in a mole's shape or color.  Also tell your health care provider if you have a mole that is larger than the size of a pencil eraser.  Always use sunscreen. Apply sunscreen liberally and repeatedly throughout the day.  Protect yourself by wearing long sleeves, pants, a wide-brimmed hat, and sunglasses whenever you are outside. HEART DISEASE, DIABETES,  AND HIGH BLOOD PRESSURE   High blood pressure causes heart disease and increases the risk of stroke. High blood pressure is more likely to develop in:  People who have blood pressure in the high end of the normal range (130-139/85-89 mm Hg).  People who are overweight or obese.  People who are African American.  If you are 13-46 years of age, have your blood pressure checked every 3-5 years. If you are 61 years of age or older, have your blood pressure checked every year. You should have your blood pressure measured twice--once when you are at a hospital or clinic, and once when you are not at a hospital or clinic. Record the average of the two measurements. To check your blood pressure when you are not at a hospital or clinic, you can use:  An automated blood pressure machine at a pharmacy.  A home blood pressure monitor.  If you are between 79 years and 66 years old, ask your health care provider if you should take aspirin to prevent strokes.  Have regular diabetes  screenings. This involves taking a blood sample to check your fasting blood sugar level.  If you are at a normal weight and have a low risk for diabetes, have this test once every three years after 61 years of age.  If you are overweight and have a high risk for diabetes, consider being tested at a younger age or more often. PREVENTING INFECTION  Hepatitis B  If you have a higher risk for hepatitis B, you should be screened for this virus. You are considered at high risk for hepatitis B if:  You were born in a country where hepatitis B is common. Ask your health care provider which countries are considered high risk.  Your parents were born in a high-risk country, and you have not been immunized against hepatitis B (hepatitis B vaccine).  You have HIV or AIDS.  You use needles to inject street drugs.  You live with someone who has hepatitis B.  You have had sex with someone who has hepatitis B.  You get hemodialysis treatment.  You take certain medicines for conditions, including cancer, organ transplantation, and autoimmune conditions. Hepatitis C  Blood testing is recommended for:  Everyone born from [birth year] through 1965.  Anyone with known risk factors for hepatitis C. Sexually transmitted infections (STIs)  You should be screened for sexually transmitted infections (STIs) including gonorrhea and chlamydia if:  You are sexually active and are younger than 61 years of age.  You are older than 61 years of age and your health care provider tells you that you are at risk for this type of infection.  Your sexual activity has changed since you were last screened and you are at an increased risk for chlamydia or gonorrhea. Ask your health care provider if you are at risk.  If you do not have HIV, but are at risk, it may be recommended that you take a prescription medicine daily to prevent HIV infection. This is called pre-exposure prophylaxis (PrEP). You are considered at risk  if:  You are sexually active and do not regularly use condoms or know the HIV status of your partner(s).  You take drugs by injection.  You are sexually active with a partner who has HIV. Talk with your health care provider about whether you are at high risk of being infected with HIV. If you choose to begin PrEP, you should first be tested for HIV. You should then be  tested every 3 months for as long as you are taking PrEP.  PREGNANCY   If you are premenopausal and you may become pregnant, ask your health care provider about preconception counseling.  If you may become pregnant, take 400 to 800 micrograms (mcg) of folic acid every day.  If you want to prevent pregnancy, talk to your health care provider about birth control (contraception). OSTEOPOROSIS AND MENOPAUSE   Osteoporosis is a disease in which the bones lose minerals and strength with aging. This can result in serious bone fractures. Your risk for osteoporosis can be identified using a bone density scan.  If you are 48 years of age or older, or if you are at risk for osteoporosis and fractures, ask your health care provider if you should be screened.  Ask your health care provider whether you should take a calcium or vitamin D supplement to lower your risk for osteoporosis.  Menopause may have certain physical symptoms and risks.  Hormone replacement therapy may reduce some of these symptoms and risks. Talk to your health care provider about whether hormone replacement therapy is right for you.  HOME CARE INSTRUCTIONS   Schedule regular health, dental, and eye exams.  Stay current with your immunizations.   Do not use any tobacco products including cigarettes, chewing tobacco, or electronic cigarettes.  If you are pregnant, do not drink alcohol.  If you are breastfeeding, limit how much and how often you drink alcohol.  Limit alcohol intake to no more than 1 drink per day for nonpregnant women. One drink equals 12  ounces of beer, 5 ounces of wine, or 1 ounces of hard liquor.  Do not use street drugs.  Do not share needles.  Ask your health care provider for help if you need support or information about quitting drugs.  Tell your health care provider if you often feel depressed.  Tell your health care provider if you have ever been abused or do not feel safe at home.   This information is not intended to replace advice given to you by your health care provider. Make sure you discuss any questions you have with your health care provider.   Document Released: 12/17/2010 Document Revised: 06/24/2014 Document Reviewed: 05/05/2013 Elsevier Interactive Patient Education [employer].

## 2016-05-08 ENCOUNTER — Other Ambulatory Visit: Payer: Self-pay | Admitting: Obstetrics and Gynecology

## 2016-05-08 DIAGNOSIS — Z1231 Encounter for screening mammogram for malignant neoplasm of breast: Secondary | ICD-10-CM

## 2016-06-03 ENCOUNTER — Other Ambulatory Visit: Payer: Self-pay | Admitting: Internal Medicine

## 2016-06-03 NOTE — Telephone Encounter (Signed)
Faxed to pharmacy

## 2016-06-18 ENCOUNTER — Ambulatory Visit: Payer: BC Managed Care – PPO

## 2016-07-08 ENCOUNTER — Other Ambulatory Visit: Payer: Self-pay | Admitting: Orthopedic Surgery

## 2016-07-08 DIAGNOSIS — M25512 Pain in left shoulder: Secondary | ICD-10-CM

## 2016-07-12 ENCOUNTER — Ambulatory Visit
Admission: RE | Admit: 2016-07-12 | Discharge: 2016-07-12 | Disposition: A | Discharge status: Home / Self Care | Payer: BC Managed Care – PPO | Source: Ambulatory Visit | Attending: Orthopedic Surgery | Admitting: Orthopedic Surgery

## 2016-07-12 DIAGNOSIS — M25512 Pain in left shoulder: Secondary | ICD-10-CM

## 2016-08-29 ENCOUNTER — Ambulatory Visit (INDEPENDENT_AMBULATORY_CARE_PROVIDER_SITE_OTHER): Payer: BC Managed Care – PPO | Admitting: Internal Medicine

## 2016-08-29 ENCOUNTER — Encounter: Payer: Self-pay | Admitting: Internal Medicine

## 2016-08-29 VITALS — BP 140/82 | HR 65 | Temp 98.2°F | Ht 61.5 in | Wt 163.0 lb

## 2016-08-29 DIAGNOSIS — E785 Hyperlipidemia, unspecified: Secondary | ICD-10-CM

## 2016-08-29 DIAGNOSIS — M25519 Pain in unspecified shoulder: Secondary | ICD-10-CM | POA: Diagnosis not present

## 2016-08-29 DIAGNOSIS — I10 Essential (primary) hypertension: Secondary | ICD-10-CM | POA: Diagnosis not present

## 2016-08-29 DIAGNOSIS — Z6830 Body mass index (BMI) 30.0-30.9, adult: Secondary | ICD-10-CM

## 2016-08-29 DIAGNOSIS — E669 Obesity, unspecified: Secondary | ICD-10-CM | POA: Diagnosis not present

## 2016-08-29 MED ORDER — ALPRAZOLAM 0.5 MG PO TABS
ORAL_TABLET | ORAL | 3 refills | Status: DC
Start: 2016-08-29 — End: 2017-03-17

## 2016-08-29 MED ORDER — METHYLPREDNISOLONE ACETATE 40 MG/ML IJ SUSP
40.0000 mg | Freq: Once | INTRAMUSCULAR | Status: AC
  Administered 2016-08-29: 40 mg via INTRAMUSCULAR

## 2016-08-29 MED ORDER — VENLAFAXINE HCL ER 150 MG PO CP24
150.0000 mg | ORAL_CAPSULE | Freq: Every day | ORAL | 3 refills | Status: DC
Start: 2016-08-29 — End: 2017-09-01

## 2016-08-29 NOTE — Progress Notes (Signed)
Pre visit review using our clinic review tool, if applicable. No additional management support is needed unless otherwise documented below in the visit note. 

## 2016-08-29 NOTE — Patient Instructions (Signed)
Bring us a copy of the labs if you can.

## 2016-08-29 NOTE — Progress Notes (Signed)
   Subjective:    Patient ID: [name] [name], female    DOB: 08/22/1955, 61 y.o.   MRN: [id number]  HPI The patient is a 61 YO female coming in for follow up of her blood pressure and cholesterol. She is still off all her medicines because of her weight loss surgery. She is down about 70 pounds since last visit. She has just had labs with her bariatric surgeon including cholesterol but does not have a copy of those results for us today. She denies headaches, chest pains, SOB. She feels better than ever and not having knee pain at all.   Review of Systems  Constitutional: Positive for activity change. Negative for appetite change, diaphoresis, fatigue, fever and unexpected weight change.       Able to do more activity  Respiratory: Negative.   Cardiovascular: Negative.   Gastrointestinal: Negative.   Musculoskeletal: Negative.   Skin: Negative.   Neurological: Negative.       Objective:   Physical Exam  Constitutional: She is oriented to person, place, and time. She appears well-developed and well-nourished.  HENT:  Head: Normocephalic and atraumatic.  Eyes: EOM are normal.  Neck: Normal range of motion.  Cardiovascular: Normal rate and regular rhythm.   Pulmonary/Chest: Effort normal. No respiratory distress. She has no wheezes. She has no rales.  Abdominal: Soft. She exhibits no distension. There is no tenderness. There is no rebound.  Neurological: She is alert and oriented to person, place, and time. Coordination normal.  Skin: Skin is warm and dry.   Vitals:   08/29/16 1104  BP: 140/82  Pulse: 65  Temp: 98.2 F (36.8 C)  TempSrc: Oral  SpO2: 98%  Weight: 163 lb (73.9 kg)  Height: 5' 1.5" (1.562 m)      Assessment & Plan:

## 2016-08-30 DIAGNOSIS — E785 Hyperlipidemia, unspecified: Secondary | ICD-10-CM | POA: Insufficient documentation

## 2016-08-30 NOTE — Assessment & Plan Note (Signed)
S/P weight loss surgery and down about 80-90 pounds overall and goal weight is about 10-15 pounds down still.

## 2016-08-30 NOTE — Assessment & Plan Note (Signed)
BP still at goal off meds. Will continue to monitor since her intensive lifestyle changes for recurrence.

## 2016-08-30 NOTE — Assessment & Plan Note (Signed)
Previously on lipitor 40 mg daily and will need to review labs to see if she still needs lipid therapy with her weight loss. She will bring us a copy.

## 2016-10-28 ENCOUNTER — Encounter: Payer: Self-pay | Admitting: Internal Medicine

## 2016-12-06 ENCOUNTER — Ambulatory Visit: Payer: BC Managed Care – PPO | Admitting: Family

## 2017-01-20 ENCOUNTER — Other Ambulatory Visit: Payer: Self-pay | Admitting: Internal Medicine

## 2017-01-20 NOTE — Telephone Encounter (Signed)
Patient has scheduled office visit with dr Jordan Likesschmitz tomorrow

## 2017-01-20 NOTE — Telephone Encounter (Signed)
Routing to dr jones---i'm not seeing this on patient's current med list---please advise if ok for refill, thanks

## 2017-01-21 ENCOUNTER — Ambulatory Visit (INDEPENDENT_AMBULATORY_CARE_PROVIDER_SITE_OTHER): Payer: BC Managed Care – PPO | Admitting: Family Medicine

## 2017-01-21 ENCOUNTER — Encounter: Payer: Self-pay | Admitting: Family Medicine

## 2017-01-21 VITALS — BP 140/88 | HR 70 | Temp 97.6°F | Ht 61.5 in | Wt 143.0 lb

## 2017-01-21 DIAGNOSIS — Z8709 Personal history of other diseases of the respiratory system: Secondary | ICD-10-CM | POA: Diagnosis not present

## 2017-01-21 DIAGNOSIS — J45909 Unspecified asthma, uncomplicated: Secondary | ICD-10-CM | POA: Diagnosis not present

## 2017-01-21 DIAGNOSIS — I10 Essential (primary) hypertension: Secondary | ICD-10-CM

## 2017-01-21 MED ORDER — ALBUTEROL SULFATE HFA 108 (90 BASE) MCG/ACT IN AERS: 2.0000 | INHALATION_SPRAY | Freq: Four times a day (QID) | RESPIRATORY_TRACT | 2 refills | Status: DC | PRN

## 2017-01-21 NOTE — Progress Notes (Signed)
[name] [name] - 61 y.o. female MRN [id number]  Date of birth: 1956/04/30  SUBJECTIVE:  Including CC & ROS.  Chief Complaint  Patient presents with  . Hypertension    Patient states took BP in machine at drug store and got 144/92 second time 150/94. patient used to be on BP medication before and wants to know if she needs to go back on it  . Medication Refill    Albuterol inhaler     [name] [name] is a 30 female is following up for her blood pressure and medication refill. She has some wheezing whenever she is around dust or dander. She takes albuterol for this. She denies any significant exacerbations. She does not have any history of asthma or COPD. She does not have any shortness of breath or wheezing. Her symptoms are well controlled otherwise. The albuterol helps with these medications.  HTN She had surgery and has lost 100 pounds. Since that time she has not taking any medication since November. She was checking [facility] at [facility] and notices elevated. The blood pressure reading was in the 140s/90s.  Chest pain- none     Dyspnea- none.  Lightheadedness-  none   Edema- none         Review of Systems  Respiratory: Negative for shortness of breath.   Cardiovascular: Negative for chest pain and leg swelling.  Musculoskeletal: Negative for gait problem and myalgias.  Skin: Negative for rash.  Neurological: Negative for weakness.    HISTORY: Past Medical, Surgical, Social, and Family History Reviewed & Updated per EMR.   Pertinent Historical Findings include:  Past Medical History:  Diagnosis Date  . Arthritis   . Asthma   . Ehlers-Danlos syndrome type III   . Hypertension     Past Surgical History:  Procedure Laterality Date  . achilles tendon tibial tendon fusion     4 surgeries  . CESAREAN SECTION    . JOINT REPLACEMENT    . SPINE SURGERY      No Known Allergies  Family History  Problem Relation Age of Onset  . Parkinson's disease Mother        Deceased,  38  . Heart disease Father        Deceased, 47  . Pancreatic cancer Sister        Survivor  . Healthy Daughter      Social History   Social History  . Marital status: Married    Spouse name: N/A  . Number of children: N/A  . Years of education: N/A   Occupational History  . Not on file.   Social History Main Topics  . Smoking status: Never Smoker  . Smokeless tobacco: Never Used  . Alcohol use No  . Drug use: No  . Sexual activity: Not on file   Other Topics Concern  . Not on file   Social History Narrative   Lives with husband in a 2 story home.  Has 1 daughter.     On disability in 2009.  Used to work as an Magazine features editor at [employer]        PHYSICAL EXAM:  VS: BP 140/88 (BP Location: Right Arm, Patient Position: Sitting, Cuff Size: Normal)   Pulse 70   Temp 97.6 F (36.4 C) (Oral)   Ht 5' 1.5" (1.562 m)   Wt 143 lb (64.9 kg)   SpO2 99%   BMI 26.58 kg/m  Physical Exam  Constitutional: She appears well-developed and well-nourished.  HENT:  Head: Normocephalic and atraumatic.  Right Ear: External ear normal.  Left Ear: External ear normal.  Eyes: Conjunctivae and EOM are normal.  Neck: Normal range of motion. Neck supple.  Cardiovascular: Normal rate and regular rhythm.   Pulmonary/Chest: Effort normal and breath sounds normal.  Abdominal: Soft. Bowel sounds are normal.  Musculoskeletal: Normal range of motion. She exhibits no edema.  Neurological: She is alert.  Skin: Skin is warm and dry.  Psychiatric: She has a normal mood and affect. Her behavior is normal.       ASSESSMENT & PLAN:   Essential hypertension She reports a blood pressure reading is elevated. It was a little elevated today as well. She is not taking any medications since November. She is asymptomatic. - Continue to monitor. She can follow-up in the nurse clinic to have her blood pressure measured.  Asthma, chronic Controlled. Associated with allergies. - Refilled albuterol  today.

## 2017-01-21 NOTE — Patient Instructions (Signed)
Thank you for coming in,   Please follow-up and the nurse clinic to have your blood pressure repeated. We can always start medication in the future.   Please feel free to call with any questions or concerns at any time, at [phone]. --Dr. Jordan LikesSchmitz

## 2017-01-21 NOTE — Assessment & Plan Note (Signed)
Controlled. Associated with allergies. - Refilled albuterol today.

## 2017-01-21 NOTE — Assessment & Plan Note (Signed)
She reports a blood pressure reading is elevated. It was a little elevated today as well. She is not taking any medications since November. She is asymptomatic. - Continue to monitor. She can follow-up in the nurse clinic to have her blood pressure measured.

## 2017-03-05 ENCOUNTER — Ambulatory Visit: Payer: BC Managed Care – PPO | Admitting: Internal Medicine

## 2017-03-07 ENCOUNTER — Ambulatory Visit (INDEPENDENT_AMBULATORY_CARE_PROVIDER_SITE_OTHER): Payer: BC Managed Care – PPO | Admitting: Family Medicine

## 2017-03-07 ENCOUNTER — Ambulatory Visit (INDEPENDENT_AMBULATORY_CARE_PROVIDER_SITE_OTHER)
Admission: RE | Admit: 2017-03-07 | Discharge: 2017-03-07 | Disposition: A | Discharge status: Home / Self Care | Payer: BC Managed Care – PPO | Source: Ambulatory Visit | Attending: Family Medicine | Admitting: Family Medicine

## 2017-03-07 ENCOUNTER — Telehealth: Payer: Self-pay | Admitting: Family Medicine

## 2017-03-07 ENCOUNTER — Encounter: Payer: Self-pay | Admitting: Family Medicine

## 2017-03-07 VITALS — BP 126/88 | HR 82 | Temp 99.2°F | Ht 61.5 in | Wt 140.1 lb

## 2017-03-07 DIAGNOSIS — R059 Cough, unspecified: Secondary | ICD-10-CM

## 2017-03-07 DIAGNOSIS — R05 Cough: Secondary | ICD-10-CM

## 2017-03-07 NOTE — Telephone Encounter (Signed)
Left VM for patient. If she calls back please have her speak with a nurse/CMA and inform that her chest xray was normal.   If any questions then please take the best time and phone number to call and I will try to call her back.   Myra Rude, MD [facility] 03/07/2017, 4:59 PM

## 2017-03-07 NOTE — Assessment & Plan Note (Signed)
There is concern that she may have an underlying pneumonia. She reports she is susceptible to getting pneumonia. Will complete Augmentin tomorrow. - Chest x-ray today - Advised supportive care.

## 2017-03-07 NOTE — Progress Notes (Signed)
[name] [name] - 61 y.o. female MRN [id number]  Date of birth: [birth year]  SUBJECTIVE:  Including CC & ROS.  Chief Complaint  Patient presents with  . URI    Patient is here today after being Dx with Sinusitis at [facility] on 9.17.18.  Tx with Augmentin bid, Benzonatate  tid, Advair, Albuterol, and Prednisone.  They recommended CXR if not better in 3d due to "noise in upper lowbes."  She states that when she completed the Prednisone her Sx worsened has had a fever off and on.    [name] [name] is a 61 year old female that is presenting with cold type symptoms. They've been ongoing for about 2 weeks now. She was seen in urgent care on Monday and was prescribed prednisone, Augmentin, Tessalon, and some other medications. She reports having some continued symptoms. She was advised at that appointment to follow up if she has not had resolution of her symptoms. She is still having coughing and wheezing. She denies any sick contacts. She does have mild fevers at night. Her symptoms seem to be worse at the end of the day. Her cough is nonproductive.     Review of Systems  Constitutional: Positive for fever.  Respiratory: Positive for cough and shortness of breath.   Cardiovascular: Negative for chest pain.    HISTORY: Past Medical, Surgical, Social, and Family History Reviewed & Updated per EMR.   Pertinent Historical Findings include:  Past Medical History:  Diagnosis Date  . Arthritis   . Asthma   . Ehlers-Danlos syndrome type III   . Hypertension     Past Surgical History:  Procedure Laterality Date  . achilles tendon tibial tendon fusion     4 surgeries  . CESAREAN SECTION    . JOINT REPLACEMENT    . SPINE SURGERY      No Known Allergies  Family History  Problem Relation Age of Onset  . Parkinson's disease Mother        Deceased, 41  . Heart disease Father        Deceased, 29  . Pancreatic cancer Sister        Survivor  . Healthy Daughter      Social History     Social History  . Marital status: Married    Spouse name: N/A  . Number of children: N/A  . Years of education: N/A   Occupational History  . Not on file.   Social History Main Topics  . Smoking status: Never Smoker  . Smokeless tobacco: Never Used  . Alcohol use No  . Drug use: No  . Sexual activity: Not on file   Other Topics Concern  . Not on file   Social History Narrative   Lives with husband in a 2 story home.  Has 1 daughter.     On disability in 2009.  Used to work as an Magazine features editor at [employer]        PHYSICAL EXAM:  VS: BP 126/88 (BP Location: Right Arm, Patient Position: Sitting, Cuff Size: Normal)   Pulse 82   Temp 99.2 F (37.3 C) (Oral)   Ht 5' 1.5" (1.562 m)   Wt 140 lb 1.9 oz (63.6 kg)   SpO2 97%   BMI 26.05 kg/m  Physical Exam Gen: NAD, alert, cooperative with exam,  ENT: normal lips, normal nasal mucosa, tympanic membranes clear and intact bilaterally, normal nasal turbinates, normal oropharynx. No cervical lymphadenopathy Eye: normal EOM, normal conjunctiva and lids CV:  no  edema, +2 pedal pulses, S1-S2, regular rate and rhythm   Resp: no accessory muscle use, non-labored, clear to auscultation bilaterally, no crackles or wheezes Skin: no rashes, no areas of induration  Neuro: normal tone, normal sensation to touch Psych:  normal insight, alert and oriented MSK: Normal gait, normal strength      ASSESSMENT & PLAN:   Cough There is concern that she may have an underlying pneumonia. She reports she is susceptible to getting pneumonia. Will complete Augmentin tomorrow. - Chest x-ray today - Advised supportive care.

## 2017-03-07 NOTE — Patient Instructions (Signed)
Thank you for coming in,   We will call you with the results from today.   Please try things such as zyrtec-D or allegra-D which is an antihistamine and decongestant.   Please try afrin which will help with nasal congestion but use for only three days.   Please also try using a netti pot on a regular occasion.  Honey can help with a sore throat.     Please feel free to call with any questions or concerns at any time, at [phone]. --Dr. Jordan Likes

## 2017-03-17 ENCOUNTER — Ambulatory Visit (INDEPENDENT_AMBULATORY_CARE_PROVIDER_SITE_OTHER): Payer: BC Managed Care – PPO | Admitting: Internal Medicine

## 2017-03-17 ENCOUNTER — Encounter: Payer: Self-pay | Admitting: Internal Medicine

## 2017-03-17 VITALS — BP 122/80 | HR 75 | Temp 98.7°F | Ht 61.5 in | Wt 138.0 lb

## 2017-03-17 DIAGNOSIS — Z23 Encounter for immunization: Secondary | ICD-10-CM | POA: Diagnosis not present

## 2017-03-17 DIAGNOSIS — G4700 Insomnia, unspecified: Secondary | ICD-10-CM

## 2017-03-17 MED ORDER — ALPRAZOLAM 0.5 MG PO TABS: ORAL_TABLET | ORAL | 5 refills | Status: DC

## 2017-03-17 NOTE — Progress Notes (Signed)
   Subjective:    Patient ID: [name] [name], female    DOB: Jun 19, 1955, 61 y.o.   MRN: [id number]  HPI The patient is a 61 YO female coming in for follow up of her sleeping problems. She is using xanax at night time for sleep as her brain will not shut down. She has been using the xanax for this for some time. She denies having side effects or drowsy during the day. She denies taking more than she should. She has tried relaxation in the past. She denies problems with anxiety during the day. She is still taking effexor for her mood which is working well. She is able to sleep 6- hours with taking this medicine.   Review of Systems  Constitutional: Negative.   Respiratory: Negative for cough, chest tightness and shortness of breath.   Cardiovascular: Negative for chest pain, palpitations and leg swelling.  Gastrointestinal: Negative for abdominal distention, abdominal pain, constipation, diarrhea, nausea and vomiting.  Musculoskeletal: Negative.   Skin: Negative.   Neurological: Negative.   Psychiatric/Behavioral: Negative.       Objective:   Physical Exam  Constitutional: She is oriented to person, place, and time. She appears well-developed and well-nourished.  HENT:  Head: Normocephalic and atraumatic.  Eyes: EOM are normal.  Neck: Normal range of motion.  Cardiovascular: Normal rate and regular rhythm.   Pulmonary/Chest: Effort normal and breath sounds normal. No respiratory distress. She has no wheezes. She has no rales.  Abdominal: Soft. Bowel sounds are normal. She exhibits no distension. There is no tenderness. There is no rebound.  Musculoskeletal: She exhibits no edema.  Neurological: She is alert and oriented to person, place, and time. Coordination normal.  Skin: Skin is warm and dry.  Psychiatric: She has a normal mood and affect.   Vitals:   03/17/17 1106  BP: 122/80  Pulse: 75  Temp: 98.7 F (37.1 C)  TempSrc: Oral  SpO2: 100%  Weight: 138 lb (62.6 kg)  Height:  5' 1.5" (1.562 m)      Assessment & Plan:  Flu shot given at visit.

## 2017-03-17 NOTE — Patient Instructions (Signed)
Think about taking claritin for the allergies to help clear up the cough

## 2017-03-19 DIAGNOSIS — G4700 Insomnia, unspecified: Secondary | ICD-10-CM | POA: Insufficient documentation

## 2017-03-19 NOTE — Assessment & Plan Note (Signed)
Refill of xanax 0.5 mg nightly and reminded that this medication can cause dependence and increased risk of memory problems, falls. She agrees with risk and wishes to continue therapy.

## 2017-05-19 ENCOUNTER — Encounter: Payer: Self-pay | Admitting: Family

## 2017-05-19 ENCOUNTER — Ambulatory Visit: Payer: BC Managed Care – PPO | Admitting: Family

## 2017-05-19 ENCOUNTER — Other Ambulatory Visit: Payer: BC Managed Care – PPO

## 2017-05-19 VITALS — BP 142/98 | HR 87 | Temp 98.6°F | Ht 61.0 in | Wt 135.0 lb

## 2017-05-19 DIAGNOSIS — R030 Elevated blood-pressure reading, without diagnosis of hypertension: Secondary | ICD-10-CM | POA: Diagnosis not present

## 2017-05-19 DIAGNOSIS — R432 Parageusia: Secondary | ICD-10-CM

## 2017-05-19 DIAGNOSIS — R319 Hematuria, unspecified: Secondary | ICD-10-CM | POA: Diagnosis not present

## 2017-05-19 LAB — POCT URINALYSIS DIPSTICK
GLUCOSE UA: NEGATIVE
KETONES UA: NEGATIVE
PH UA: 6 (ref 5.0–8.0)
Spec Grav, UA: 1.025 (ref 1.010–1.025)
Urobilinogen, UA: 1 E.U./dL

## 2017-05-19 MED ORDER — CIPROFLOXACIN HCL 500 MG PO TABS: 500.0000 mg | ORAL_TABLET | Freq: Two times a day (BID) | ORAL | 0 refills | Status: DC

## 2017-05-19 NOTE — Patient Instructions (Signed)
Please start checking your blood pressure daily and send in your blood pressure log within 2 weeks.

## 2017-05-19 NOTE — Progress Notes (Signed)
[name] [name] is a 61 y.o. female with the following history as recorded in [facility]:  Patient Active Problem List   Diagnosis Date Noted  . Insomnia 03/19/2017  . Cough 03/07/2017  . Hyperlipidemia 08/30/2016  . Hyperparathyroidism (HCC) 08/31/2015  . Asthma, chronic 11/30/2014  . Obesity 06/01/2014  . Arthritis 06/01/2014  . Routine general medical examination at a health care facility 06/01/2014  . Ehlers-Danlos syndrome 05/31/2014  . Essential hypertension 02/18/2009    Current Outpatient Medications  Medication Sig Dispense Refill  . albuterol (PROVENTIL HFA;VENTOLIN HFA) 108 (90 Base) MCG/ACT inhaler Inhale 2 puffs into the lungs every 6 (six) hours as needed for wheezing. 1 Inhaler 2  . ALPRAZolam (XANAX) 0.5 MG tablet take 1 tablet by mouth at bedtime if needed for anxiety 30 tablet 5  . Fluticasone-Salmeterol (ADVAIR) 100-50 MCG/DOSE AEPB Inhale 1 puff into the lungs 2 (two) times daily. 1 each 3  . venlafaxine XR (EFFEXOR-XR) 150 MG 24 hr capsule Take 1 capsule (150 mg total) by mouth daily. 90 capsule 3  . ciprofloxacin (CIPRO) 500 MG tablet Take 1 tablet (500 mg total) by mouth 2 (two) times daily. 10 tablet 0   No current facility-administered medications for this visit.     Allergies: Patient has no known allergies.  Past Medical History:  Diagnosis Date  . Arthritis   . Asthma   . Ehlers-Danlos syndrome type III   . Hypertension     Past Surgical History:  Procedure Laterality Date  . achilles tendon tibial tendon fusion     4 surgeries  . CESAREAN SECTION    . JOINT REPLACEMENT    . SPINE SURGERY      Family History  Problem Relation Age of Onset  . Parkinson's disease Mother        Deceased, 4348  . Heart disease Father        Deceased, 2280  . Pancreatic cancer Sister        Survivor  . Healthy Daughter     Social History   Tobacco Use  . Smoking status: Never Smoker  . Smokeless tobacco: Never Used  Substance Use Topics  . Alcohol use: No   Alcohol/week: 0.0 oz    Subjective:  Patient presents with 4 day history of blood in urine; notes that symptoms started last Friday and do seem to be somewhat more noticeable in the past 24 hours; denies any fever or back pain; denies any burning on urination, hesitancy or urgency; feels that she drinks adequate water; traveled short distance over Thanksgiving- only 2 hours by car; no prior history of kidney stone or UTI; denies any blood in stool, abdominal pain or nausea or vomiting.  Also mentions that she has had "sudden loss of taste" since she was treated for bronchitis in October 2018; took course of Augmentin and respiratory symptoms resolved but noticed sudden loss of taste; denies any sore throat or cough or difficulty swallowing; no rash noted on tongue; does feel that lymph node on left side of neck might be  slightly enlarged;   Objective:  Vitals:   05/19/17 1114  BP: (!) 142/98  Pulse: 87  Temp: 98.6 F (37 C)  SpO2: 99%  Weight: 135 lb (61.2 kg)  Height: 5\' 1"  (1.549 m)    General: Well developed, well nourished, in no acute distress  Skin : Warm and dry.  Head: Normocephalic and atraumatic  Eyes: Sclera and conjunctiva clear; pupils round and reactive to light; extraocular movements  intact  Ears: External normal; canals clear; tympanic membranes normal  Oropharynx: Pink, supple. No suspicious lesions  Neck: Supple without thyromegaly, adenopathy  Lungs: Respirations unlabored; clear to auscultation bilaterally without wheeze, rales, rhonchi  CVS exam: normal rate, regular rhythm,   Abdomen: Soft; nontender; nondistended; normoactive bowel sounds; no masses or hepatosplenomegaly  Musculoskeletal: No deformities; no active joint inflammation  Extremities: No edema, cyanosis, clubbing  Neurologic: Alert and oriented; speech intact; face symmetrical; moves all extremities well; CNII-XII intact without focal deficit  Assessment:  1. Hematuria, unspecified type   2. Loss  of taste   3. Elevated blood pressure reading     Plan:  1. Check U/A and urine culture today; will treat with Cipro 500 mg bid x 5 days; if no infection found on urine culture, will need to pursue imaging;  2. ? Etiology; refer to ENT; may also need neurology evaluation; 3. Patient does have history of hypertension but was able to come off medication with bariatric surgery; she is to monitor daily x 2 weeks and forward results for review; may need to re-start low dose medication. Follow-up to be determined.   No Follow-up on file.  Orders Placed This Encounter  Procedures  . Urine Culture    Standing Status:   Future    Number of Occurrences:   1    Standing Expiration Date:   05/19/2018  . Ambulatory referral to ENT    Referral Priority:   Routine    Referral Type:   Consultation    Referral Reason:   Specialty Services Required    Requested Specialty:   Otolaryngology    Number of Visits Requested:   1  . POCT Urinalysis Dipstick    Requested Prescriptions   Signed Prescriptions Disp Refills  . ciprofloxacin (CIPRO) 500 MG tablet 10 tablet 0    Sig: Take 1 tablet (500 mg total) by mouth 2 (two) times daily.

## 2017-05-20 LAB — URINE CULTURE
MICRO NUMBER:: 81354932
SPECIMEN QUALITY:: ADEQUATE

## 2017-05-21 ENCOUNTER — Other Ambulatory Visit: Payer: Self-pay | Admitting: Family

## 2017-05-21 DIAGNOSIS — R319 Hematuria, unspecified: Secondary | ICD-10-CM

## 2017-05-29 ENCOUNTER — Other Ambulatory Visit: Payer: Self-pay | Admitting: Otolaryngology

## 2017-05-29 DIAGNOSIS — R432 Parageusia: Secondary | ICD-10-CM

## 2017-06-02 ENCOUNTER — Telehealth: Payer: Self-pay | Admitting: Internal Medicine

## 2017-06-02 NOTE — Telephone Encounter (Signed)
Copied from CRM [phone]. Topic: Inquiry >> Jun 02, 2017  2:57 PM Windy KalataMichael, Taylor L, NT wrote: Reason for CRM: Patient states she was seen by Ria ClockLaura Murray in the office and she said she was diagnosed with hematuria and it cleared up but it is back again. She wants to know could she get another round of Cipro. Patient would like a call back if you can Vernona RiegerLaura.

## 2017-06-03 NOTE — Telephone Encounter (Signed)
Please advise 

## 2017-06-04 ENCOUNTER — Other Ambulatory Visit: Payer: Self-pay | Admitting: Family

## 2017-06-04 MED ORDER — NITROFURANTOIN MONOHYD MACRO 100 MG PO CAPS: 100.0000 mg | ORAL_CAPSULE | Freq: Two times a day (BID) | ORAL | 0 refills | Status: DC

## 2017-06-04 NOTE — Telephone Encounter (Signed)
Patient advised of laura's notes/instructions---urology has set appt for January 14th (this is earliest available appt) and they do not want patient to have CT until after she is seen by urologist---she will pick up macrobid and start med---do you want to make any changes to treatment since she was told not to complete CT until she was seen by urology?  Please advise, I will call patient back, thanks

## 2017-06-04 NOTE — Telephone Encounter (Signed)
I really need her to get the CT to determine where the bleeding is coming from. Has she been contacted about that yet or the urology referral yet? There were both ordered on 12/5? I am going to give her a different antibiotic- I don't want to use Cipro long-term. Will send in Macrobid 100 mg bid x 7 days.

## 2017-06-04 NOTE — Telephone Encounter (Signed)
No change to treatment; just wanted to make sure she had been scheduled for follow-up. Thanks-

## 2017-06-04 NOTE — Telephone Encounter (Signed)
Patient advised no changes unless I called her back

## 2017-06-26 ENCOUNTER — Other Ambulatory Visit: Payer: BC Managed Care – PPO

## 2017-06-26 ENCOUNTER — Encounter: Payer: Self-pay | Admitting: Family

## 2017-06-26 ENCOUNTER — Ambulatory Visit: Payer: BC Managed Care – PPO | Admitting: Family

## 2017-06-26 ENCOUNTER — Other Ambulatory Visit (INDEPENDENT_AMBULATORY_CARE_PROVIDER_SITE_OTHER): Payer: BC Managed Care – PPO

## 2017-06-26 VITALS — BP 138/82 | HR 74 | Temp 98.9°F | Ht 61.0 in | Wt 140.1 lb

## 2017-06-26 DIAGNOSIS — R319 Hematuria, unspecified: Secondary | ICD-10-CM

## 2017-06-26 LAB — CBC WITH DIFFERENTIAL/PLATELET
BASOS PCT: 0.8 % (ref 0.0–3.0)
Basophils Absolute: 0.1 10*3/uL (ref 0.0–0.1)
EOS ABS: 0.1 10*3/uL (ref 0.0–0.7)
EOS PCT: 1.7 % (ref 0.0–5.0)
HEMATOCRIT: 40.9 % (ref 36.0–46.0)
HEMOGLOBIN: 13.5 g/dL (ref 12.0–15.0)
Lymphocytes Relative: 34.6 % (ref 12.0–46.0)
Lymphs Abs: 2.4 10*3/uL (ref 0.7–4.0)
MCHC: 32.9 g/dL (ref 30.0–36.0)
MCV: 89.2 fl (ref 78.0–100.0)
MONO ABS: 0.6 10*3/uL (ref 0.1–1.0)
Monocytes Relative: 8.8 % (ref 3.0–12.0)
NEUTROS ABS: 3.8 10*3/uL (ref 1.4–7.7)
Neutrophils Relative %: 54.1 % (ref 43.0–77.0)
PLATELETS: 342 10*3/uL (ref 150.0–400.0)
RBC: 4.59 Mil/uL (ref 3.87–5.11)
RDW: 12.9 % (ref 11.5–15.5)
WBC: 7 10*3/uL (ref 4.0–10.5)

## 2017-06-26 LAB — COMPREHENSIVE METABOLIC PANEL
ALT: 42 U/L — ABNORMAL HIGH (ref 0–35)
AST: 35 U/L (ref 0–37)
Albumin: 4.1 g/dL (ref 3.5–5.2)
Alkaline Phosphatase: 108 U/L (ref 39–117)
BUN: 14 mg/dL (ref 6–23)
CHLORIDE: 106 meq/L (ref 96–112)
CO2: 31 meq/L (ref 19–32)
CREATININE: 0.62 mg/dL (ref 0.40–1.20)
Calcium: 9 mg/dL (ref 8.4–10.5)
GFR: 103.51 mL/min (ref >60.00)
Glucose, Bld: 88 mg/dL (ref 70–99)
POTASSIUM: 4.4 meq/L (ref 3.5–5.1)
SODIUM: 143 meq/L (ref 135–145)
Total Bilirubin: 0.4 mg/dL (ref 0.2–1.2)
Total Protein: 7.2 g/dL (ref 6.0–8.3)

## 2017-06-26 MED ORDER — NITROFURANTOIN MONOHYD MACRO 100 MG PO CAPS: 100.0000 mg | ORAL_CAPSULE | Freq: Two times a day (BID) | ORAL | 0 refills | Status: DC

## 2017-06-26 NOTE — Progress Notes (Signed)
[name] [name] is a 62 y.o. female with the following history as recorded in [facility]:  Patient Active Problem List   Diagnosis Date Noted  . Insomnia 03/19/2017  . Cough 03/07/2017  . Hyperlipidemia 08/30/2016  . Hyperparathyroidism (HCC) 08/31/2015  . Asthma, chronic 11/30/2014  . Obesity 06/01/2014  . Arthritis 06/01/2014  . Routine general medical examination at a health care facility 06/01/2014  . Ehlers-Danlos syndrome 05/31/2014  . Essential hypertension 02/18/2009    Current Outpatient Medications  Medication Sig Dispense Refill  . albuterol (PROVENTIL HFA;VENTOLIN HFA) 108 (90 Base) MCG/ACT inhaler Inhale 2 puffs into the lungs every 6 (six) hours as needed for wheezing. 1 Inhaler 2  . Fluticasone-Salmeterol (ADVAIR) 100-50 MCG/DOSE AEPB Inhale 1 puff into the lungs 2 (two) times daily. 1 each 3  . venlafaxine XR (EFFEXOR-XR) 150 MG 24 hr capsule Take 1 capsule (150 mg total) by mouth daily. 90 capsule 3  . ciprofloxacin (CIPRO) 500 MG tablet Take 1 tablet (500 mg total) by mouth 2 (two) times daily. (Patient not taking: Reported on 06/26/2017) 10 tablet 0  . nitrofurantoin, macrocrystal-monohydrate, (MACROBID) 100 MG capsule Take 1 capsule (100 mg total) by mouth 2 (two) times daily. (Patient not taking: Reported on 06/26/2017) 14 capsule 0  . nitrofurantoin, macrocrystal-monohydrate, (MACROBID) 100 MG capsule Take 1 capsule (100 mg total) by mouth 2 (two) times daily. 6 capsule 0   No current facility-administered medications for this visit.     Allergies: Patient has no known allergies.  Past Medical History:  Diagnosis Date  . Arthritis   . Asthma   . Ehlers-Danlos syndrome type III   . Hypertension     Past Surgical History:  Procedure Laterality Date  . achilles tendon tibial tendon fusion     4 surgeries  . CESAREAN SECTION    . JOINT REPLACEMENT    . SPINE SURGERY      Family History  Problem Relation Age of Onset  . Parkinson's disease Mother    Deceased, 9  . Heart disease Father        Deceased, 13  . Pancreatic cancer Sister        Survivor  . Healthy Daughter     Social History   Tobacco Use  . Smoking status: Never Smoker  . Smokeless tobacco: Never Used  Substance Use Topics  . Alcohol use: No    Alcohol/week: 0.0 oz    Subjective:  Patient presents with concerns for recurrent episodes of blood in her urine; was originally seen early December and started on Cipro with initial benefit; symptoms cleared for about 2 weeks and then patient called back with recurrence; was treated with Macrobid with benefit; symptoms re-started in past 3 days; denies any pain with urination or urgency or frequency; does have urology appointment scheduled on Monday; urology specifically asked that patient not do any imaging until they consult and decide appropriate imaging;   Objective:  Vitals:   06/26/17 1329  BP: 138/82  Pulse: 74  Temp: 98.9 F (37.2 C)  TempSrc: Oral  SpO2: 99%  Weight: 140 lb 1.3 oz (63.5 kg)  Height: 5\' 1"  (1.549 m)    General: Well developed, well nourished, in no acute distress  Skin : Warm and dry.  Head: Normocephalic and atraumatic  Lungs: Respirations unlabored; clear to auscultation bilaterally without wheeze, rales, rhonchi  Neurologic: Alert and oriented; speech intact; face symmetrical; moves all extremities well; CNII-XII intact without focal deficit  Assessment:  1. Hematuria, unspecified  type     Plan:  Recurrent- keep planned appointment with urology for Monday of next week; check U/A and urine culture today; update CBC, CMP; will re-treat with Macrobid 100 mg bid x 3 days; follow-up to be determined.   No Follow-up on file.  Orders Placed This Encounter  Procedures  . Urine Culture    Standing Status:   Future    Standing Expiration Date:   06/26/2018  . CBC w/Diff    Standing Status:   Future    Standing Expiration Date:   06/26/2018  . Comprehensive metabolic panel    Standing  Status:   Future    Standing Expiration Date:   06/26/2018    Requested Prescriptions   Signed Prescriptions Disp Refills  . nitrofurantoin, macrocrystal-monohydrate, (MACROBID) 100 MG capsule 6 capsule 0    Sig: Take 1 capsule (100 mg total) by mouth 2 (two) times daily.

## 2017-06-27 LAB — URINE CULTURE
MICRO NUMBER: 90040689
MICRO NUMBER:: 90040681
Result:: NO GROWTH
Result:: NO GROWTH
SPECIMEN QUALITY: ADEQUATE
SPECIMEN QUALITY:: ADEQUATE

## 2017-07-10 ENCOUNTER — Other Ambulatory Visit: Payer: Self-pay | Admitting: Obstetrics and Gynecology

## 2017-07-10 DIAGNOSIS — Z1231 Encounter for screening mammogram for malignant neoplasm of breast: Secondary | ICD-10-CM

## 2017-07-10 NOTE — Progress Notes (Signed)
Entered in error

## 2017-07-22 ENCOUNTER — Other Ambulatory Visit: Payer: Self-pay | Admitting: Urology

## 2017-07-23 ENCOUNTER — Encounter (HOSPITAL_BASED_OUTPATIENT_CLINIC_OR_DEPARTMENT_OTHER): Payer: Self-pay

## 2017-07-25 ENCOUNTER — Encounter (HOSPITAL_BASED_OUTPATIENT_CLINIC_OR_DEPARTMENT_OTHER): Payer: Self-pay

## 2017-07-25 ENCOUNTER — Other Ambulatory Visit: Payer: Self-pay

## 2017-07-25 NOTE — Progress Notes (Signed)
Spoke with:  Jasmine DecemberSharon NPO:  After Midnight, no gum, candy, or mints   Arrival time: 10:45 AM Labs:  Istat8 AM medications:  None Pre op orders:  Yes Ride home:  Onalee HuaDavid (husband) unsure of cellphone number at the time

## 2017-08-01 ENCOUNTER — Ambulatory Visit (HOSPITAL_BASED_OUTPATIENT_CLINIC_OR_DEPARTMENT_OTHER): Payer: BC Managed Care – PPO | Admitting: Anesthesiology

## 2017-08-01 ENCOUNTER — Inpatient Hospital Stay: Admission: RE | Admit: 2017-08-01 | Payer: BC Managed Care – PPO | Source: Ambulatory Visit

## 2017-08-01 ENCOUNTER — Ambulatory Visit (HOSPITAL_BASED_OUTPATIENT_CLINIC_OR_DEPARTMENT_OTHER)
Admission: RE | Admit: 2017-08-01 | Discharge: 2017-08-01 | Disposition: A | Discharge status: Home / Self Care | Payer: BC Managed Care – PPO | Source: Ambulatory Visit | Attending: Urology | Admitting: Urology

## 2017-08-01 ENCOUNTER — Encounter (HOSPITAL_BASED_OUTPATIENT_CLINIC_OR_DEPARTMENT_OTHER)
Admission: RE | Disposition: A | Discharge status: Home / Self Care | Payer: Self-pay | Source: Ambulatory Visit | Attending: Urology

## 2017-08-01 ENCOUNTER — Encounter (HOSPITAL_BASED_OUTPATIENT_CLINIC_OR_DEPARTMENT_OTHER): Payer: Self-pay

## 2017-08-01 DIAGNOSIS — M1990 Unspecified osteoarthritis, unspecified site: Secondary | ICD-10-CM | POA: Diagnosis not present

## 2017-08-01 DIAGNOSIS — M797 Fibromyalgia: Secondary | ICD-10-CM | POA: Insufficient documentation

## 2017-08-01 DIAGNOSIS — F419 Anxiety disorder, unspecified: Secondary | ICD-10-CM | POA: Insufficient documentation

## 2017-08-01 DIAGNOSIS — N201 Calculus of ureter: Secondary | ICD-10-CM | POA: Diagnosis present

## 2017-08-01 DIAGNOSIS — Z87442 Personal history of urinary calculi: Secondary | ICD-10-CM | POA: Insufficient documentation

## 2017-08-01 DIAGNOSIS — Q796 Ehlers-Danlos syndrome: Secondary | ICD-10-CM | POA: Insufficient documentation

## 2017-08-01 DIAGNOSIS — Z800 Family history of malignant neoplasm of digestive organs: Secondary | ICD-10-CM | POA: Insufficient documentation

## 2017-08-01 DIAGNOSIS — E785 Hyperlipidemia, unspecified: Secondary | ICD-10-CM | POA: Insufficient documentation

## 2017-08-01 DIAGNOSIS — K219 Gastro-esophageal reflux disease without esophagitis: Secondary | ICD-10-CM | POA: Insufficient documentation

## 2017-08-01 DIAGNOSIS — R011 Cardiac murmur, unspecified: Secondary | ICD-10-CM | POA: Diagnosis not present

## 2017-08-01 DIAGNOSIS — K760 Fatty (change of) liver, not elsewhere classified: Secondary | ICD-10-CM | POA: Insufficient documentation

## 2017-08-01 DIAGNOSIS — N132 Hydronephrosis with renal and ureteral calculous obstruction: Secondary | ICD-10-CM | POA: Insufficient documentation

## 2017-08-01 DIAGNOSIS — G4700 Insomnia, unspecified: Secondary | ICD-10-CM | POA: Diagnosis not present

## 2017-08-01 DIAGNOSIS — Z9884 Bariatric surgery status: Secondary | ICD-10-CM | POA: Diagnosis not present

## 2017-08-01 DIAGNOSIS — J45909 Unspecified asthma, uncomplicated: Secondary | ICD-10-CM | POA: Diagnosis not present

## 2017-08-01 DIAGNOSIS — G4730 Sleep apnea, unspecified: Secondary | ICD-10-CM | POA: Diagnosis not present

## 2017-08-01 HISTORY — DX: Low back pain, unspecified: M54.50

## 2017-08-01 HISTORY — PX: CYSTOSCOPY WITH RETROGRADE PYELOGRAM, URETEROSCOPY AND STENT PLACEMENT: SHX5789

## 2017-08-01 HISTORY — DX: Pneumonia, unspecified organism: J18.9

## 2017-08-01 HISTORY — DX: Personal history of urinary calculi: Z87.442

## 2017-08-01 HISTORY — DX: Gastro-esophageal reflux disease without esophagitis: K21.9

## 2017-08-01 HISTORY — DX: Fatty (change of) liver, not elsewhere classified: K76.0

## 2017-08-01 HISTORY — DX: Hyperlipidemia, unspecified: E78.5

## 2017-08-01 HISTORY — DX: Personal history of other diseases of the respiratory system: Z87.09

## 2017-08-01 HISTORY — DX: Hyperparathyroidism, unspecified: E21.3

## 2017-08-01 HISTORY — DX: Insomnia, unspecified: G47.00

## 2017-08-01 HISTORY — DX: Personal history of other diseases of urinary system: Z87.448

## 2017-08-01 HISTORY — DX: Personal history of other diseases of the digestive system: Z87.19

## 2017-08-01 HISTORY — DX: Cardiac murmur, unspecified: R01.1

## 2017-08-01 HISTORY — DX: Obesity, unspecified: E66.9

## 2017-08-01 HISTORY — PX: HOLMIUM LASER APPLICATION: SHX5852

## 2017-08-01 HISTORY — DX: Pain in leg, unspecified: M79.606

## 2017-08-01 HISTORY — DX: Other complications of anesthesia, initial encounter: T88.59XA

## 2017-08-01 HISTORY — DX: Fibromyalgia: M79.7

## 2017-08-01 HISTORY — DX: Sleep apnea, unspecified: G47.30

## 2017-08-01 HISTORY — DX: Anxiety disorder, unspecified: F41.9

## 2017-08-01 HISTORY — DX: Low back pain: M54.5

## 2017-08-01 HISTORY — DX: Prediabetes: R73.03

## 2017-08-01 HISTORY — DX: Adverse effect of unspecified anesthetic, initial encounter: T41.45XA

## 2017-08-01 LAB — POCT I-STAT, CHEM 8
BUN: 18 mg/dL (ref 6–20)
CHLORIDE: 105 mmol/L (ref 101–111)
CREATININE: 0.5 mg/dL (ref 0.44–1.00)
Calcium, Ion: 1.19 mmol/L (ref 1.15–1.40)
GLUCOSE: 82 mg/dL (ref 65–99)
HCT: 34 % — ABNORMAL LOW (ref 36.0–46.0)
Hemoglobin: 11.6 g/dL — ABNORMAL LOW (ref 12.0–15.0)
Potassium: 4.1 mmol/L (ref 3.5–5.1)
Sodium: 145 mmol/L (ref 135–145)
TCO2: 30 mmol/L (ref 22–32)

## 2017-08-01 SURGERY — CYSTOURETEROSCOPY, WITH RETROGRADE PYELOGRAM AND STENT INSERTION
Anesthesia: General | Site: Renal | Laterality: Right

## 2017-08-01 MED ORDER — PROPOFOL 10 MG/ML IV BOLUS
INTRAVENOUS | Status: DC | PRN
  Administered 2017-08-01: 150 mg via INTRAVENOUS

## 2017-08-01 MED ORDER — MIDAZOLAM HCL 5 MG/5ML IJ SOLN
INTRAMUSCULAR | Status: DC | PRN
  Administered 2017-08-01: 2 mg via INTRAVENOUS

## 2017-08-01 MED ORDER — ONDANSETRON HCL 4 MG/2ML IJ SOLN
INTRAMUSCULAR | Status: DC | PRN
  Administered 2017-08-01: 4 mg via INTRAVENOUS

## 2017-08-01 MED ORDER — FENTANYL CITRATE (PF) 100 MCG/2ML IJ SOLN
INTRAMUSCULAR | Status: DC | PRN
  Administered 2017-08-01 (×2): 50 ug via INTRAVENOUS

## 2017-08-01 MED ORDER — SCOPOLAMINE 1 MG/3DAYS TD PT72
MEDICATED_PATCH | TRANSDERMAL | Status: AC
  Filled 2017-08-01: qty 1

## 2017-08-01 MED ORDER — MIDAZOLAM HCL 2 MG/2ML IJ SOLN
INTRAMUSCULAR | Status: AC
  Filled 2017-08-01: qty 2

## 2017-08-01 MED ORDER — PROMETHAZINE HCL 25 MG/ML IJ SOLN
6.2500 mg | INTRAMUSCULAR | Status: DC | PRN
  Filled 2017-08-01: qty 1

## 2017-08-01 MED ORDER — DEXAMETHASONE SODIUM PHOSPHATE 10 MG/ML IJ SOLN
INTRAMUSCULAR | Status: AC
  Filled 2017-08-01: qty 1

## 2017-08-01 MED ORDER — ONDANSETRON HCL 4 MG/2ML IJ SOLN
INTRAMUSCULAR | Status: AC
  Filled 2017-08-01: qty 2

## 2017-08-01 MED ORDER — TRAMADOL HCL 50 MG PO TABS: 50.0000 mg | ORAL_TABLET | Freq: Four times a day (QID) | ORAL | 0 refills | Status: DC | PRN

## 2017-08-01 MED ORDER — SENNOSIDES-DOCUSATE SODIUM 8.6-50 MG PO TABS: 1.0000 | ORAL_TABLET | Freq: Two times a day (BID) | ORAL | 0 refills | Status: DC

## 2017-08-01 MED ORDER — PROPOFOL 10 MG/ML IV BOLUS
INTRAVENOUS | Status: AC
  Filled 2017-08-01: qty 20

## 2017-08-01 MED ORDER — SCOPOLAMINE 1 MG/3DAYS TD PT72
MEDICATED_PATCH | TRANSDERMAL | Status: DC | PRN
  Administered 2017-08-01: 1 via TRANSDERMAL

## 2017-08-01 MED ORDER — GENTAMICIN SULFATE 40 MG/ML IJ SOLN
5.0000 mg/kg | INTRAMUSCULAR | Status: AC
  Administered 2017-08-01: 320 mg via INTRAVENOUS
  Filled 2017-08-01 (×2): qty 8

## 2017-08-01 MED ORDER — LIDOCAINE 2% (20 MG/ML) 5 ML SYRINGE
INTRAMUSCULAR | Status: AC
  Filled 2017-08-01: qty 5

## 2017-08-01 MED ORDER — FENTANYL CITRATE (PF) 100 MCG/2ML IJ SOLN
25.0000 ug | INTRAMUSCULAR | Status: DC | PRN
  Filled 2017-08-01: qty 1

## 2017-08-01 MED ORDER — IOHEXOL 300 MG/ML  SOLN
INTRAMUSCULAR | Status: DC | PRN
  Administered 2017-08-01: 20 mL

## 2017-08-01 MED ORDER — FENTANYL CITRATE (PF) 100 MCG/2ML IJ SOLN
INTRAMUSCULAR | Status: AC
  Filled 2017-08-01: qty 2

## 2017-08-01 MED ORDER — LIDOCAINE 2% (20 MG/ML) 5 ML SYRINGE
INTRAMUSCULAR | Status: DC | PRN
  Administered 2017-08-01: 100 mg via INTRAVENOUS

## 2017-08-01 MED ORDER — KETOROLAC TROMETHAMINE 10 MG PO TABS: 10.0000 mg | ORAL_TABLET | Freq: Three times a day (TID) | ORAL | 0 refills | Status: DC | PRN

## 2017-08-01 MED ORDER — LACTATED RINGERS IV SOLN
INTRAVENOUS | Status: DC
  Administered 2017-08-01: 11:00:00 via INTRAVENOUS
  Filled 2017-08-01: qty 1000

## 2017-08-01 MED ORDER — DEXAMETHASONE SODIUM PHOSPHATE 4 MG/ML IJ SOLN
INTRAMUSCULAR | Status: DC | PRN
  Administered 2017-08-01: 10 mg via INTRAVENOUS

## 2017-08-01 MED ORDER — SODIUM CHLORIDE 0.9 % IR SOLN
Status: DC | PRN
  Administered 2017-08-01: 4000 mL

## 2017-08-01 MED ORDER — CEPHALEXIN 500 MG PO CAPS: 500.0000 mg | ORAL_CAPSULE | Freq: Two times a day (BID) | ORAL | 0 refills | Status: DC

## 2017-08-01 SURGICAL SUPPLY — 27 items
BAG DRAIN URO-CYSTO SKYTR STRL (DRAIN) ×2 IMPLANT
BAG DRN UROCATH (DRAIN) ×1
BASKET LASER NITINOL 1.9FR (BASKET) IMPLANT
BASKET STONE NITINOL 3FRX115MB (UROLOGICAL SUPPLIES) ×2 IMPLANT
CATH INTERMIT  6FR 70CM (CATHETERS) IMPLANT
CLOTH BEACON ORANGE TIMEOUT ST (SAFETY) ×2 IMPLANT
FIBER LASER FLEXIVA 365 (UROLOGICAL SUPPLIES) IMPLANT
FIBER LASER TRAC TIP (UROLOGICAL SUPPLIES) ×2 IMPLANT
GLOVE BIO SURGEON STRL SZ 6.5 (GLOVE) ×2 IMPLANT
GLOVE BIO SURGEON STRL SZ7.5 (GLOVE) ×2 IMPLANT
GLOVE BIOGEL PI IND STRL 6.5 (GLOVE) ×1 IMPLANT
GLOVE BIOGEL PI INDICATOR 6.5 (GLOVE) ×1
GOWN STRL REUS W/TWL LRG LVL3 (GOWN DISPOSABLE) ×4 IMPLANT
GUIDEWIRE ANG ZIPWIRE 038X150 (WIRE) ×2 IMPLANT
GUIDEWIRE STR DUAL SENSOR (WIRE) ×2 IMPLANT
INFUSOR MANOMETER BAG 3000ML (MISCELLANEOUS) ×2 IMPLANT
IV NS 1000ML (IV SOLUTION) ×1
IV NS 1000ML BAXH (IV SOLUTION) ×1 IMPLANT
IV NS IRRIG 3000ML ARTHROMATIC (IV SOLUTION) ×2 IMPLANT
KIT RM TURNOVER CYSTO AR (KITS) ×2 IMPLANT
MANIFOLD NEPTUNE II (INSTRUMENTS) ×2 IMPLANT
NS IRRIG 500ML POUR BTL (IV SOLUTION) ×2 IMPLANT
PACK CYSTO (CUSTOM PROCEDURE TRAY) ×2 IMPLANT
STENT POLARIS 5FRX22 (STENTS) ×2 IMPLANT
SYRINGE 10CC LL (SYRINGE) ×2 IMPLANT
TUBE CONNECTING 12X1/4 (SUCTIONS) IMPLANT
TUBE FEEDING 8FR 16IN STR KANG (MISCELLANEOUS) ×2 IMPLANT

## 2017-08-01 NOTE — Anesthesia Preprocedure Evaluation (Signed)
Anesthesia Evaluation  Patient identified by MRN, date of birth, ID band Patient awake    Reviewed: Allergy & Precautions, NPO status , Patient's Chart, lab work & pertinent test results  Airway Mallampati: II  TM Distance: >3 FB Neck ROM: Full    Dental  (+) Dental Advisory Given   Pulmonary asthma , sleep apnea ,    breath sounds clear to auscultation       Cardiovascular negative cardio ROS   Rhythm:Regular Rate:Normal     Neuro/Psych negative neurological ROS     GI/Hepatic Neg liver ROS, GERD  ,  Endo/Other  negative endocrine ROS  Renal/GU negative Renal ROS     Musculoskeletal  (+) Arthritis , Fibromyalgia -  Abdominal   Peds  Hematology negative hematology ROS (+)   Anesthesia Other Findings   Reproductive/Obstetrics                             Anesthesia Physical Anesthesia Plan  ASA: II  Anesthesia Plan: General   Post-op Pain Management:    Induction:   PONV Risk Score and Plan: 3 and Dexamethasone, Ondansetron, Treatment may vary due to age or medical condition and Midazolam  Airway Management Planned: LMA  Additional Equipment:   Intra-op Plan:   Post-operative Plan: Extubation in OR  Informed Consent: I have reviewed the patients History and Physical, chart, labs and discussed the procedure including the risks, benefits and alternatives for the proposed anesthesia with the patient or authorized representative who has indicated his/her understanding and acceptance.   Dental advisory given  Plan Discussed with: CRNA  Anesthesia Plan Comments:         Anesthesia Quick Evaluation

## 2017-08-01 NOTE — H&P (Signed)
[name] [name] is an 62 y.o. female.    Chief Complaint: Pre-op RIGHT Ureteroscopic Stone Manipulation  HPI:   1 - Gross Hematuria / Right Ureteral Stone - several episodes visible blood in urine late 2018 / early 2019. UCX negative or non-clonal x several. Cr 0.62 by epic / PCP labs 06/2017. Non smoker. No chronic solvent esposure. CT 06/2017 with 9mm fusiform Right distal stone with mod hydro. No additional stones.   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 She is retired Engineer, water. Her PCP is Hillard Danker MD with [facility].   Today "[name] [name] " is seen to proceed with ureteroscopy for right ureteral stone in setting of hematuria. No interval fevers. Most recetn UCX negative.     Past Medical History:  Diagnosis Date  . Anxiety   . Arthritis   . Asthma   . Complication of anesthesia    headache after neck surgery  . Ehlers-Danlos syndrome type III   . Fatty liver   . Fibromyalgia   . GERD (gastroesophageal reflux disease)    history of  . Heart murmur   . History of blood in urine   . History of bronchitis   . History of cholelithiasis   . History of kidney stones   . Hyperlipemia   . Hyperparathyroidism (HCC)   . Hypertension    no medication needed since bariatric surgery  . Insomnia   . Leg pain   . Low back pain   . Obese    history of  . Pneumonia    history of   . Pre-diabetes    no since weight loss  . Sleep apnea    improved since weight loss    Past Surgical History:  Procedure Laterality Date  . achilles tendon tibial tendon fusion     4 surgeries  . CESAREAN SECTION    . CHOLECYSTECTOMY    . COLONOSCOPY    . JOINT REPLACEMENT Right    partial joint replacement knee  . spinal injections    . SPINE SURGERY     L4-L5, C6-C7     Family History  Problem Relation Age of Onset  . Parkinson's disease Mother        Deceased, 25  . Heart disease Father         Deceased, 43  . Pancreatic cancer Sister        Survivor  . Healthy Daughter    Social History:  reports that  has never smoked. she has never used smokeless tobacco. She reports that she drinks alcohol. She reports that she does not use drugs.  Allergies: No Known Allergies  No medications prior to admission.    No results found for this or any previous visit (from the past 48 hour(s)). No results found.  Review of Systems  Constitutional: Negative.  Negative for chills and fever.  HENT: Negative.   Eyes: Negative.   Respiratory: Negative.   Cardiovascular: Negative.   Gastrointestinal: Negative.   Genitourinary: Negative.   Musculoskeletal: Negative.   Skin: Negative.   Neurological: Negative.   Endo/Heme/Allergies: Negative.   Psychiatric/Behavioral: Negative.     Height 5\' 1"  (1.549 m), weight 61.2 kg (135 lb). Physical Exam  Constitutional: She appears well-developed.  HENT:  Head: Normocephalic.  Eyes: Pupils are equal, round, and reactive to light.  Neck: Normal range of motion.  Cardiovascular: Normal rate.  Respiratory: Effort normal.  GI: Soft.  Genitourinary:  Genitourinary Comments: No CVAT at present.   Musculoskeletal: Normal range of motion.  Neurological: She is alert.  Skin: Skin is warm.  Psychiatric: She has a normal mood and affect.     Assessment/Plan  1 - Gross Hematuria / Right Ureteral Stone - proceed as planned with RIGHT ureteroscopic stone manipulation. Risks, benefits, alternatives, expected peri-op course discussed previously and reiterated today.    Sebastian AcheMANNY, Zacharie Portner, MD 08/01/2017, 7:07 AM

## 2017-08-01 NOTE — Transfer of Care (Signed)
Last Vitals:  Vitals:   08/01/17 1046 08/01/17 1300  BP: (!) 147/86 137/81  Pulse: 77 74  Resp: 18 13  Temp: 37.2 C 36.7 C  SpO2: 98% 100%    Last Pain:  Vitals:   08/01/17 1046  TempSrc: Oral      Patients Stated Pain Goal: 5 (08/01/17 1109)  Immediate Anesthesia Transfer of Care Note  Patient: [name] [name]  Procedure(s) Performed: Procedure(s) (LRB): CYSTOSCOPY WITH RETROGRADE PYELOGRAM, URETEROSCOPY AND STENT PLACEMENT (Right) HOLMIUM LASER APPLICATION (Right)  Patient Location: PACU  Anesthesia Type: General  Level of Consciousness: awake, alert  and oriented  Airway & Oxygen Therapy: Patient Spontanous Breathing and Patient connected to face mask oxygen  Post-op Assessment: Report given to PACU RN and Post -op Vital signs reviewed and stable  Post vital signs: Reviewed and stable  Complications: No apparent anesthesia complications

## 2017-08-01 NOTE — Brief Op Note (Signed)
08/01/2017  12:48 PM  PATIENT:  [name] [name]  62 y.o. female  PRE-OPERATIVE DIAGNOSIS:  RIGHT URETERAL STONE  POST-OPERATIVE DIAGNOSIS:  RIGHT URETERAL STONE  PROCEDURE:  Procedure(s): CYSTOSCOPY WITH RETROGRADE PYELOGRAM, URETEROSCOPY AND STENT PLACEMENT (Right) HOLMIUM LASER APPLICATION (Right)  SURGEON:  Surgeon(s) and Role:    [name] Ache* Solaris Kram, MD - Primary  PHYSICIAN ASSISTANT:   ASSISTANTS: none   ANESTHESIA:   general  EBL:  5 mL   BLOOD ADMINISTERED:0 CC PRBC  DRAINS: none   LOCAL MEDICATIONS USED:  NONE  SPECIMEN:  Source of Specimen:  Rt ureteral stone fragments  DISPOSITION OF SPECIMEN:  [facility] for compositional analysis  COUNTS:  YES  TOURNIQUET:  * No tourniquets in log *  DICTATION: .Other Dictation: Dictation Number [id number]  PLAN OF CARE: Discharge to home after PACU  PATIENT DISPOSITION:  PACU - hemodynamically stable.   Delay start of Pharmacological VTE agent (>24hrs) due to surgical blood loss or risk of bleeding: yes

## 2017-08-01 NOTE — Discharge Instructions (Signed)
1 - You may have urinary urgency (bladder spasms) and bloody urine on / off with stent in place. This is normal.  2 - Remove tethered stent on Monday morning at home by pulling on string, then blue-white plastic tubing, and discarding. Office is open on Monday if any acute issues arise.   3 - Call MD or go to ER for fever >102, severe pain / nausea / vomiting not relieved by medications, or acute change in medical status.  [facility] [phone] Post Ureteroscopy With or Without Stent Instructions  Definitions:  Ureter: The duct that transports urine from the kidney to the bladder. Stent:   A plastic hollow tube that is placed into the ureter, from the kidney to the bladder to prevent the ureter from swelling shut.  GENERAL INSTRUCTIONS:  Despite the fact that no skin incisions were used, the area around the ureter and bladder is raw and irritated. The stent is a foreign body which will further irritate the bladder wall. This irritation is manifested by increased frequency of urination, both day and night, and by an increase in the urge to urinate. In some, the urge to urinate is present almost always. Sometimes the urge is strong enough that you may not be able to stop yourself from urinating. The only real cure is to remove the stent and then give time for the bladder wall to heal which can't be done until the danger of the ureter swelling shut has passed, which varies.  You may see some blood in your urine while the stent is in place and a few days afterwards. Do not be alarmed, even if the urine was clear for a while. Get off your feet and drink lots of fluids until clearing occurs. If you start to pass clots or don't improve, call us.  DIET: You may return to your normal diet immediately. Because of the raw surface of your bladder, alcohol, spicy foods, acid type foods and drinks with caffeine may cause irritation or frequency and should be used in moderation. To keep  your urine flowing freely and to avoid constipation, drink plenty of fluids during the day ( 8-10 glasses ). Tip: Avoid cranberry juice because it is very acidic.  ACTIVITY: Your physical activity doesn't need to be restricted. However, if you are very active, you may see some blood in your urine. We suggest that you reduce your activity under these circumstances until the bleeding has stopped.  BOWELS: It is important to keep your bowels regular during the postoperative period. Straining with bowel movements can cause bleeding. A bowel movement every other day is reasonable. Use a mild laxative if needed, such as Milk of Magnesia 2-3 tablespoons, or 2 Dulcolax tablets. Call if you continue to have problems. If you have been taking narcotics for pain, before, during or after your surgery, you may be constipated. Take a laxative if necessary.   MEDICATION: You should resume your pre-surgery medications unless told not to. In addition you will often be given an antibiotic to prevent infection. These should be taken as prescribed until the bottles are finished unless you are having an unusual reaction to one of the drugs.  PROBLEMS YOU SHOULD REPORT TO US:  Fevers over 100.5 Fahrenheit.  Heavy bleeding, or clots ( See above notes about blood in urine ).  Inability to urinate.  Drug reactions ( hives, rash, nausea, vomiting, diarrhea ).  Severe burning or pain with urination that is not improving.  FOLLOW-UP: You will  need a follow-up appointment to monitor your progress. Call for this appointment at the number listed above. Usually the first appointment will be about three to fourteen days after your surgery.  Post Anesthesia Home Care Instructions  Activity: Get plenty of rest for the remainder of the day. A responsible individual must stay with you for 24 hours following the procedure.  For the next 24 hours, DO NOT: -Drive a car -Advertising copywriter -Drink alcoholic beverages -Take  any medication unless instructed by your physician -Make any legal decisions or sign important papers.  Meals: Start with liquid foods such as gelatin or soup. Progress to regular foods as tolerated. Avoid greasy, spicy, heavy foods. If nausea and/or vomiting occur, drink only clear liquids until the nausea and/or vomiting subsides. Call your physician if vomiting continues.  Special Instructions/Symptoms: Your throat may feel dry or sore from the anesthesia or the breathing tube placed in your throat during surgery. If this causes discomfort, gargle with warm salt water. The discomfort should disappear within 24 hours.  If you had a scopolamine patch placed behind your ear for the management of post- operative nausea and/or vomiting:  1. The medication in the patch is effective for 72 hours, after which it should be removed.  Wrap patch in a tissue and discard in the trash. Wash hands thoroughly with soap and water. 2. You may remove the patch earlier than 72 hours if you experience unpleasant side effects which may include dry mouth, dizziness or visual disturbances. 3. Avoid touching the patch. Wash your hands with soap and water after contact with the patch.

## 2017-08-01 NOTE — Anesthesia Procedure Notes (Signed)
Procedure Name: LMA Insertion Date/Time: 08/01/2017 12:33 PM Performed by: Marcene DuosFitzgerald, Robert, MD Pre-anesthesia Checklist: Patient identified, Emergency Drugs available, Suction available and Patient being monitored Patient Re-evaluated:Patient Re-evaluated prior to induction Oxygen Delivery Method: Circle system utilized Preoxygenation: Pre-oxygenation with 100% oxygen Induction Type: IV induction Ventilation: Mask ventilation without difficulty LMA: LMA inserted LMA Size: 4.0 Number of attempts: 1 Airway Equipment and Method: Bite block Placement Confirmation: positive ETCO2 Tube secured with: Tape Dental Injury: Teeth and Oropharynx as per pre-operative assessment

## 2017-08-04 ENCOUNTER — Encounter (HOSPITAL_BASED_OUTPATIENT_CLINIC_OR_DEPARTMENT_OTHER): Payer: Self-pay | Admitting: Urology

## 2017-08-04 NOTE — Op Note (Signed)
NAME:  [name] [name], [name] [name]                     ACCOUNT NO.:  MEDICAL RECORD NO.:  [id number]  LOCATION:                                 FACILITY:  PHYSICIAN:  [name] Acheheodore Dontavious Emily, MD          DATE OF BIRTH:  DATE OF PROCEDURE: 08/01/2017                                OPERATIVE REPORT   DIAGNOSIS:  Right ureteral stone.  PROCEDURES: 1. Cystoscopy with right retrograde pyelogram and interpretation. 2. Right ureteroscopy with laser lithotripsy. 3. Insertion of right ureteral stent, 5 x 22 Polaris with tether.  ESTIMATED BLOOD LOSS:  Nil.  COMPLICATION:  None.  SPECIMEN:  Right ureteral stone fragments for compositional analysis.  FINDINGS: 1. Moderately impacted right distal ureteral stone.  This is very     fusiform as anticipated. 2. Complete resolution of all stone fragments within the right ureter     following laser lithotripsy and basket extraction. 3. Successful placement of right ureteral stent, proximal in the renal     pelvis and distal in the urinary bladder.  INDICATION:  [name] [name] is a very pleasant 62 year old lady with history of Ehlers-Danlos syndrome, who was found on workup of colicky flank pain and hematuria to have a right distal ureteral stone at approximately 1 cm, this was quite fusiform.  She has been on medical therapy for quite some time and has not passed this.  Options were discussed for management including continued medical therapy versus shockwave lithotripsy versus ureteroscopy with the latter being favored in this situation and stone location, and she wished to proceed.  Informed consent was obtained and placed in the medical record.  PROCEDURE IN DETAIL:  The patient being [name] [name], was verified. Procedure being right ureteroscopic stone manipulation was confirmed. Procedure was carried out.  Time-out was performed.  Intravenous antibiotics were administered.  General LMA anesthesia was introduced. The patient was placed into a low lithotomy  position.  Sterile field was created by prepping and draping the patient's vagina, introitus and proximal thighs using iodine.  Next, cystourethroscopy was performed using a 22-French rigid cystoscope with offset lens.  Inspection of the urinary bladder revealed no diverticula, calcifications, or papular lesions.  There was some mass effect in mounding of the right intramural ureter likely consistent with known stone.  The stone that was not crowning at the UVJ; as such, the right ureteral orifice was cannulated with a 6-French end-hole catheter and right retrograde pyelogram was obtained.  Right retrograde pyelogram demonstrated a single right ureter with single-system right kidney.  There was mild hydronephrosis with minimal tortuosity and a filling defect in the distal ureter consistent with known stone.  A 0.038 Zip wire was advanced to the level of the upper pole, set aside as a safety wire.  An 8-French feeding tube placed in the urinary bladder for pressure release.  Next, semi-rigid ureteroscopy was performed to the distal ureter alongside a separate Sensor working wire.  As anticipated, there was a very fusiform stone in the right distal ureter.  This did appear to be too large for simple basketing. As such, holmium laser energy applied to the stone using settings of  0.2 joules and 20 hertz, and this fragmented into approximately three smaller pieces.  These were then grasped sequentially on their long axis, removed, set aside for compositional analysis.  Semi-rigid ureteroscopy of remainder of the entire length of right ureter revealed no additional calcifications.  There was no evidence of renal perforation.  Given the impacted nature of the stone, it was felt that brief interval stenting would be warranted.  As such, a new 5 x 22 Polaris stent was placed over the remaining safety wire.  Using cystoscopic guidance, good proximal and distal deployment were noted. The tether  was left in place and fashioned to the mons pubis, and the procedure was terminated.  The patient tolerated the procedure well.  No immediate periprocedural complications.  The patient was taken to the postanesthesia care unit in stable condition.          ______________________________ [name] Ache, MD     TM/MEDQ  D:  08/01/2017  T:  08/02/2017  Job:  [id number]

## 2017-08-05 NOTE — Anesthesia Postprocedure Evaluation (Signed)
Anesthesia Post Note  Patient: Jacki ConesSharon L Kuster  Procedure(s) Performed: CYSTOSCOPY WITH RETROGRADE PYELOGRAM, URETEROSCOPY AND STENT PLACEMENT (Right Renal) HOLMIUM LASER APPLICATION (Right Renal)     Patient location during evaluation: PACU Anesthesia Type: General Level of consciousness: awake and alert Pain management: pain level controlled Vital Signs Assessment: post-procedure vital signs reviewed and stable Respiratory status: spontaneous breathing, nonlabored ventilation, respiratory function stable and patient connected to nasal cannula oxygen Cardiovascular status: blood pressure returned to baseline and stable Postop Assessment: no apparent nausea or vomiting Anesthetic complications: no    Last Vitals:  Vitals:   08/01/17 1330 08/01/17 1352  BP: (!) 137/98 (!) 167/84  Pulse: 78 63  Resp: 18 18  Temp:  36.5 C  SpO2: 98% 100%    Last Pain:  Vitals:   08/01/17 1046  TempSrc: Oral                 Kennieth RadFitzgerald, Aven Cegielski E

## 2017-09-01 ENCOUNTER — Other Ambulatory Visit: Payer: Self-pay

## 2017-09-01 MED ORDER — VENLAFAXINE HCL ER 150 MG PO CP24: 150.0000 mg | ORAL_CAPSULE | Freq: Every day | ORAL | 0 refills | Status: DC

## 2017-09-15 ENCOUNTER — Ambulatory Visit: Payer: BC Managed Care – PPO | Admitting: Internal Medicine

## 2017-09-15 ENCOUNTER — Encounter: Payer: Self-pay | Admitting: Internal Medicine

## 2017-09-15 ENCOUNTER — Other Ambulatory Visit (INDEPENDENT_AMBULATORY_CARE_PROVIDER_SITE_OTHER): Payer: BC Managed Care – PPO

## 2017-09-15 VITALS — BP 130/88 | HR 76 | Temp 98.4°F | Ht 61.0 in | Wt 139.0 lb

## 2017-09-15 DIAGNOSIS — Z0000 Encounter for general adult medical examination without abnormal findings: Secondary | ICD-10-CM

## 2017-09-15 DIAGNOSIS — G4700 Insomnia, unspecified: Secondary | ICD-10-CM

## 2017-09-15 DIAGNOSIS — J4520 Mild intermittent asthma, uncomplicated: Secondary | ICD-10-CM | POA: Diagnosis not present

## 2017-09-15 DIAGNOSIS — E785 Hyperlipidemia, unspecified: Secondary | ICD-10-CM | POA: Diagnosis not present

## 2017-09-15 DIAGNOSIS — I10 Essential (primary) hypertension: Secondary | ICD-10-CM

## 2017-09-15 LAB — COMPREHENSIVE METABOLIC PANEL
ALBUMIN: 4 g/dL (ref 3.5–5.2)
ALK PHOS: 103 U/L (ref 39–117)
ALT: 42 U/L — ABNORMAL HIGH (ref 0–35)
AST: 38 U/L — ABNORMAL HIGH (ref 0–37)
BUN: 18 mg/dL (ref 6–23)
CALCIUM: 9.1 mg/dL (ref 8.4–10.5)
CO2: 30 mEq/L (ref 19–32)
Chloride: 106 mEq/L (ref 96–112)
Creatinine, Ser: 0.49 mg/dL (ref 0.40–1.20)
GFR: 135.71 mL/min (ref >60.00)
Glucose, Bld: 97 mg/dL (ref 70–99)
POTASSIUM: 4.1 meq/L (ref 3.5–5.1)
Sodium: 143 mEq/L (ref 135–145)
TOTAL PROTEIN: 7.3 g/dL (ref 6.0–8.3)
Total Bilirubin: 0.4 mg/dL (ref 0.2–1.2)

## 2017-09-15 LAB — LIPID PANEL
CHOLESTEROL: 167 mg/dL (ref 0–200)
HDL: 65.2 mg/dL (ref >39.00)
LDL Cholesterol: 78 mg/dL (ref 0–99)
NonHDL: 101.35
TRIGLYCERIDES: 119 mg/dL (ref 0.0–149.0)
Total CHOL/HDL Ratio: 3
VLDL: 23.8 mg/dL (ref 0.0–40.0)

## 2017-09-15 LAB — CBC
HEMATOCRIT: 38.7 % (ref 36.0–46.0)
HEMOGLOBIN: 13 g/dL (ref 12.0–15.0)
MCHC: 33.5 g/dL (ref 30.0–36.0)
MCV: 89.8 fl (ref 78.0–100.0)
PLATELETS: 376 10*3/uL (ref 150.0–400.0)
RBC: 4.32 Mil/uL (ref 3.87–5.11)
RDW: 13.5 % (ref 11.5–15.5)
WBC: 6.6 10*3/uL (ref 4.0–10.5)

## 2017-09-15 LAB — HEMOGLOBIN A1C: HEMOGLOBIN A1C: 4.7 % (ref 4.6–6.5)

## 2017-09-15 NOTE — Assessment & Plan Note (Signed)
Checking lipid panel and currently controlled by diet. Adjust as needed.

## 2017-09-15 NOTE — Assessment & Plan Note (Signed)
Needs mammogram and pap smear with gyn (she will call and schedule). Needs colonoscopy and she would like to schedule on her own. Checking hep c screening, declines hiv screening. Counseled about sun safety and hearing protection. Given screening recommendations.

## 2017-09-15 NOTE — Assessment & Plan Note (Signed)
BP at goal off meds. Continue to monitor.

## 2017-09-15 NOTE — Progress Notes (Signed)
   Subjective:    Patient ID: [name] ConesSharon L Sitzmann, female    DOB: 12/20/1955, 62 y.o.   MRN: [id number]  HPI The patient is a 62 YO female coming in for wellness. No new concerns.   [facility] social history reviewed and updated.   Review of Systems  Constitutional: Negative.   HENT: Negative.   Eyes: Negative.   Respiratory: Negative for cough, chest tightness and shortness of breath.   Cardiovascular: Negative for chest pain, palpitations and leg swelling.  Gastrointestinal: Negative for abdominal distention, abdominal pain, constipation, diarrhea, nausea and vomiting.  Musculoskeletal: Positive for arthralgias and back pain.  Skin: Negative.   Neurological: Negative.   Psychiatric/Behavioral: Negative.       Objective:   Physical Exam  Constitutional: She is oriented to person, place, and time. She appears well-developed and well-nourished.  HENT:  Head: Normocephalic and atraumatic.  Eyes: EOM are normal.  Neck: Normal range of motion.  Cardiovascular: Normal rate and regular rhythm.  Pulmonary/Chest: Effort normal and breath sounds normal. No respiratory distress. She has no wheezes. She has no rales.  Abdominal: Soft. Bowel sounds are normal. She exhibits no distension. There is no tenderness. There is no rebound.  Musculoskeletal: She exhibits no edema.  Neurological: She is alert and oriented to person, place, and time. Coordination normal.  Skin: Skin is warm and dry.  Psychiatric: She has a normal mood and affect.   Vitals:   09/15/17 1036  BP: 130/88  Pulse: 76  Temp: 98.4 F (36.9 C)  TempSrc: Oral  SpO2: 97%  Weight: 139 lb (63 kg)  Height: 5\' 1"  (1.549 m)      Assessment & Plan:

## 2017-09-15 NOTE — Patient Instructions (Signed)
We will check the labs today.  Think about getting the pap smear and the mammogram and colonoscopy done.  Health Maintenance, Female Adopting a healthy lifestyle and getting preventive care can go a long way to promote health and wellness. Talk with your health care provider about what schedule of regular examinations is right for you. This is a good chance for you to check in with your provider about disease prevention and staying healthy. In between checkups, there are plenty of things you can do on your own. Experts have done a lot of research about which lifestyle changes and preventive measures are most likely to keep you healthy. Ask your health care provider for more information. Weight and diet Eat a healthy diet  Be sure to include plenty of vegetables, fruits, low-fat dairy products, and lean protein.  Do not eat a lot of foods high in solid fats, added sugars, or salt.  Get regular exercise. This is one of the most important things you can do for your health. ? Most adults should exercise for at least 150 minutes each week. The exercise should increase your heart rate and make you sweat (moderate-intensity exercise). ? Most adults should also do strengthening exercises at least twice a week. This is in addition to the moderate-intensity exercise.  Maintain a healthy weight  Body mass index (BMI) is a measurement that can be used to identify possible weight problems. It estimates body fat based on height and weight. Your health care provider can help determine your BMI and help you achieve or maintain a healthy weight.  For females 65 years of age and older: ? A BMI below 18.5 is considered underweight. ? A BMI of 18.5 to 24.9 is normal. ? A BMI of 25 to 29.9 is considered overweight. ? A BMI of 30 and above is considered obese.  Watch levels of cholesterol and blood lipids  You should start having your blood tested for lipids and cholesterol at 63 years of age, then have this  test every 5 years.  You may need to have your cholesterol levels checked more often if: ? Your lipid or cholesterol levels are high. ? You are older than 63 years of age. ? You are at high risk for heart disease.  Cancer screening Lung Cancer  Lung cancer screening is recommended for adults 11-64 years old who are at high risk for lung cancer because of a history of smoking.  A yearly low-dose CT scan of the lungs is recommended for people who: ? Currently smoke. ? Have quit within the past 15 years. ? Have at least a 30-pack-year history of smoking. A pack year is smoking an average of one pack of cigarettes a day for 1 year.  Yearly screening should continue until it has been 15 years since you quit.  Yearly screening should stop if you develop a health problem that would prevent you from having lung cancer treatment.  Breast Cancer  Practice breast self-awareness. This means understanding how your breasts normally appear and feel.  It also means doing regular breast self-exams. Let your health care provider know about any changes, no matter how small.  If you are in your 20s or 30s, you should have a clinical breast exam (CBE) by a health care provider every 1-3 years as part of a regular health exam.  If you are 38 or older, have a CBE every year. Also consider having a breast X-ray (mammogram) every year.  If you have a family  history of breast cancer, talk to your health care provider about genetic screening.  If you are at high risk for breast cancer, talk to your health care provider about having an MRI and a mammogram every year.  Breast cancer gene (BRCA) assessment is recommended for women who have family members with BRCA-related cancers. BRCA-related cancers include: ? Breast. ? Ovarian. ? Tubal. ? Peritoneal cancers.  Results of the assessment will determine the need for genetic counseling and BRCA1 and BRCA2 testing.  Cervical Cancer Your health care  provider may recommend that you be screened regularly for cancer of the pelvic organs (ovaries, uterus, and vagina). This screening involves a pelvic examination, including checking for microscopic changes to the surface of your cervix (Pap test). You may be encouraged to have this screening done every 3 years, beginning at age 66.  For women ages 96-65, health care providers may recommend pelvic exams and Pap testing every 3 years, or they may recommend the Pap and pelvic exam, combined with testing for human papilloma virus (HPV), every 5 years. Some types of HPV increase your risk of cervical cancer. Testing for HPV may also be done on women of any age with unclear Pap test results.  Other health care providers may not recommend any screening for nonpregnant women who are considered low risk for pelvic cancer and who do not have symptoms. Ask your health care provider if a screening pelvic exam is right for you.  If you have had past treatment for cervical cancer or a condition that could lead to cancer, you need Pap tests and screening for cancer for at least 20 years after your treatment. If Pap tests have been discontinued, your risk factors (such as having a new sexual partner) need to be reassessed to determine if screening should resume. Some women have medical problems that increase the chance of getting cervical cancer. In these cases, your health care provider may recommend more frequent screening and Pap tests.  Colorectal Cancer  This type of cancer can be detected and often prevented.  Routine colorectal cancer screening usually begins at 63 years of age and continues through 63 years of age.  Your health care provider may recommend screening at an earlier age if you have risk factors for colon cancer.  Your health care provider may also recommend using home test kits to check for hidden blood in the stool.  A small camera at the end of a tube can be used to examine your colon  directly (sigmoidoscopy or colonoscopy). This is done to check for the earliest forms of colorectal cancer.  Routine screening usually begins at age 46.  Direct examination of the colon should be repeated every 5-10 years through 63 years of age. However, you may need to be screened more often if early forms of precancerous polyps or small growths are found.  Skin Cancer  Check your skin from head to toe regularly.  Tell your health care provider about any new moles or changes in moles, especially if there is a change in a mole's shape or color.  Also tell your health care provider if you have a mole that is larger than the size of a pencil eraser.  Always use sunscreen. Apply sunscreen liberally and repeatedly throughout the day.  Protect yourself by wearing long sleeves, pants, a wide-brimmed hat, and sunglasses whenever you are outside.  Heart disease, diabetes, and high blood pressure  High blood pressure causes heart disease and increases the risk of stroke.  High blood pressure is more likely to develop in: ? People who have blood pressure in the high end of the normal range (130-139/85-89 mm Hg). ? People who are overweight or obese. ? People who are African American.  If you are 69-78 years of age, have your blood pressure checked every 3-5 years. If you are 59 years of age or older, have your blood pressure checked every year. You should have your blood pressure measured twice-once when you are at a hospital or clinic, and once when you are not at a hospital or clinic. Record the average of the two measurements. To check your blood pressure when you are not at a hospital or clinic, you can use: ? An automated blood pressure machine at a pharmacy. ? A home blood pressure monitor.  If you are between 71 years and 74 years old, ask your health care provider if you should take aspirin to prevent strokes.  Have regular diabetes screenings. This involves taking a blood sample to  check your fasting blood sugar level. ? If you are at a normal weight and have a low risk for diabetes, have this test once every three years after 63 years of age. ? If you are overweight and have a high risk for diabetes, consider being tested at a younger age or more often. Preventing infection Hepatitis B  If you have a higher risk for hepatitis B, you should be screened for this virus. You are considered at high risk for hepatitis B if: ? You were born in a country where hepatitis B is common. Ask your health care provider which countries are considered high risk. ? Your parents were born in a high-risk country, and you have not been immunized against hepatitis B (hepatitis B vaccine). ? You have HIV or AIDS. ? You use needles to inject street drugs. ? You live with someone who has hepatitis B. ? You have had sex with someone who has hepatitis B. ? You get hemodialysis treatment. ? You take certain medicines for conditions, including cancer, organ transplantation, and autoimmune conditions.  Hepatitis C  Blood testing is recommended for: ? Everyone born from [birth year] through 1966. ? Anyone with known risk factors for hepatitis C.  Sexually transmitted infections (STIs)  You should be screened for sexually transmitted infections (STIs) including gonorrhea and chlamydia if: ? You are sexually active and are younger than 63 years of age. ? You are older than 63 years of age and your health care provider tells you that you are at risk for this type of infection. ? Your sexual activity has changed since you were last screened and you are at an increased risk for chlamydia or gonorrhea. Ask your health care provider if you are at risk.  If you do not have HIV, but are at risk, it may be recommended that you take a prescription medicine daily to prevent HIV infection. This is called pre-exposure prophylaxis (PrEP). You are considered at risk if: ? You are sexually active and do not regularly  use condoms or know the HIV status of your partner(s). ? You take drugs by injection. ? You are sexually active with a partner who has HIV.  Talk with your health care provider about whether you are at high risk of being infected with HIV. If you choose to begin PrEP, you should first be tested for HIV. You should then be tested every 3 months for as long as you are taking PrEP. Pregnancy  If you  are premenopausal and you may become pregnant, ask your health care provider about preconception counseling.  If you may become pregnant, take 400 to 800 micrograms (mcg) of folic acid every day.  If you want to prevent pregnancy, talk to your health care provider about birth control (contraception). Osteoporosis and menopause  Osteoporosis is a disease in which the bones lose minerals and strength with aging. This can result in serious bone fractures. Your risk for osteoporosis can be identified using a bone density scan.  If you are 61 years of age or older, or if you are at risk for osteoporosis and fractures, ask your health care provider if you should be screened.  Ask your health care provider whether you should take a calcium or vitamin D supplement to lower your risk for osteoporosis.  Menopause may have certain physical symptoms and risks.  Hormone replacement therapy may reduce some of these symptoms and risks. Talk to your health care provider about whether hormone replacement therapy is right for you. Follow these instructions at home:  Schedule regular health, dental, and eye exams.  Stay current with your immunizations.  Do not use any tobacco products including cigarettes, chewing tobacco, or electronic cigarettes.  If you are pregnant, do not drink alcohol.  If you are breastfeeding, limit how much and how often you drink alcohol.  Limit alcohol intake to no more than 1 drink per day for nonpregnant women. One drink equals 12 ounces of beer, 5 ounces of wine, or 1 ounces  of hard liquor.  Do not use street drugs.  Do not share needles.  Ask your health care provider for help if you need support or information about quitting drugs.  Tell your health care provider if you often feel depressed.  Tell your health care provider if you have ever been abused or do not feel safe at home. This information is not intended to replace advice given to you by your health care provider. Make sure you discuss any questions you have with your health care provider. Document Released: 12/17/2010 Document Revised: 11/09/2015 Document Reviewed: 03/07/2015 Elsevier Interactive Patient Education  [employer].

## 2017-09-15 NOTE — Assessment & Plan Note (Signed)
Uses xanax rarely.

## 2017-09-15 NOTE — Assessment & Plan Note (Signed)
Uses albuterol and advair prn but not lately. No flare since last visit.

## 2017-09-16 LAB — HEPATITIS C ANTIBODY
Hepatitis C Ab: NONREACTIVE
SIGNAL TO CUT-OFF: 0.03 (ref <1.00)

## 2017-10-09 ENCOUNTER — Encounter: Payer: Self-pay | Admitting: *Deleted

## 2017-10-22 NOTE — Progress Notes (Signed)
Entered in error

## 2017-10-23 ENCOUNTER — Telehealth: Payer: Self-pay | Admitting: Internal Medicine

## 2017-10-23 NOTE — Telephone Encounter (Signed)
Received 3 pages of records from [facility] forward to Dr.Elizabeth Crawford.

## 2017-11-01 ENCOUNTER — Other Ambulatory Visit: Payer: Self-pay | Admitting: Emergency Medicine

## 2017-11-01 MED ORDER — VENLAFAXINE HCL ER 150 MG PO CP24: 150.0000 mg | ORAL_CAPSULE | Freq: Every day | ORAL | 0 refills | Status: DC

## 2017-11-05 ENCOUNTER — Telehealth: Payer: Self-pay | Admitting: Internal Medicine

## 2017-11-05 NOTE — Telephone Encounter (Signed)
Copied from CRM [id number]. Topic: Quick Communication - Rx Refill/Question >> Nov 05, 2017  3:26 PM Laural Benes, [place] C wrote: Pt returned call, pt says that she lost medication while she was on a trip and now she need a new Rx for her medication.    ALSO, pt says that she also need a refill on her alprazolam   Pharmacy:  [facility] - [place], [place] - [address] [phone] (Phone) [phone] (Fax)

## 2017-11-05 NOTE — Telephone Encounter (Addendum)
Patient called, left VM to return call to the office for clarification on her Effexor and the refill request.

## 2017-11-05 NOTE — Addendum Note (Signed)
Addended by: Wilford Corner on: 11/05/2017 04:10 PM   Modules accepted: Orders

## 2017-11-05 NOTE — Telephone Encounter (Signed)
Duplicate encounter

## 2017-11-05 NOTE — Telephone Encounter (Signed)
Copied from CRM [id number]. Topic: Quick Communication - Rx Refill/Question >> Nov 05, 2017 12:32 PM Zada Girt, Lumin L wrote: Medication: venlafaxine XR (EFFEXOR-XR) 150 MG 24 hr capsule (lost 90 day supply half way through taking it)  Has the patient contacted their pharmacy? Yes.   (Agent: If no, request that the patient contact the pharmacy for the refill.) (Agent: If yes, when and what did the pharmacy advise?)  Preferred Pharmacy (with phone number or street name): [facility] - [place], [place] - [address] [place] [place] [address] Phone: [phone] Fax: [phone]  Agent: Please be advised that RX refills may take up to 3 business days. We ask that you follow-up with your pharmacy.

## 2017-11-05 NOTE — Telephone Encounter (Signed)
Copied from CRM [id number]. Topic: Quick Communication - Rx Refill/Question >> Nov 05, 2017  3:26 PM Laural Benes, [place] C wrote: Pt returned call, pt says that she lost medication while she was on a trip and now she need a new Rx for her medication.    ALSO, pt says that she also need a refill on her alprazolam   Effexor and Alprazolam refills Last OV:09/15/17 Last refill: Effexor 11/01/17 7 cap/0 refill; Xanax 03/17/17 30 tab/5 refill [id number] Pharmacy:  [facility] - [place], [place] - [address] [phone] (Phone) [phone] (Fax)

## 2017-11-06 MED ORDER — VENLAFAXINE HCL ER 150 MG PO CP24: 150.0000 mg | ORAL_CAPSULE | Freq: Every day | ORAL | 1 refills | Status: DC

## 2017-11-06 NOTE — Addendum Note (Signed)
Addended by: Berton Lan R on: 11/06/2017 02:00 PM   Modules accepted: Orders

## 2017-11-06 NOTE — Telephone Encounter (Signed)
Control database checked last refill: 08/29/2017 Patient states she lost her effexor on vacation and needs a refill

## 2017-11-06 NOTE — Telephone Encounter (Signed)
Okay to refill effexor and okay to request pharmacy to fill early.

## 2017-11-12 ENCOUNTER — Ambulatory Visit: Payer: BC Managed Care – PPO

## 2017-12-30 ENCOUNTER — Telehealth: Payer: Self-pay | Admitting: Internal Medicine

## 2017-12-30 NOTE — Telephone Encounter (Signed)
Copied from CRM [id number]. Topic: Quick Communication - See Telephone Encounter >> Dec 30, 2017  3:00 PM Waymon AmatoBurton, Donna F wrote: [facility] is needing a new rx for her alprazalam ( which she stated but it is not on the med list ) because she changed pharmacies and she needs this refil   Best number [phone]

## 2017-12-31 NOTE — Telephone Encounter (Signed)
Check [place] registry last filled 08/29/2017.Marland Kitchen.Raechel Chute/lmb

## 2017-12-31 NOTE — Telephone Encounter (Signed)
Rx refill request: alprazolam 0.5 mg  LOV: 09/15/17  PCP: Okey Duprerawford  Pharmacy: verified

## 2018-01-01 MED ORDER — ALPRAZOLAM 0.5 MG PO TABS: 0.5000 mg | ORAL_TABLET | Freq: Every day | ORAL | 0 refills | Status: DC | PRN

## 2018-01-01 NOTE — Addendum Note (Signed)
Addended by: Hillard DankerRAWFORD, ELIZABETH A on: 01/01/2018 09:27 AM   Modules accepted: Orders

## 2018-01-01 NOTE — Telephone Encounter (Signed)
MD approved and sent electronically to [facility]../lmb  

## 2018-02-09 ENCOUNTER — Other Ambulatory Visit: Payer: Self-pay | Admitting: Internal Medicine

## 2018-02-09 NOTE — Telephone Encounter (Signed)
Control database checked last refill: 01/01/2018 NOV: 09/15/2017 LOV:03/17/2018

## 2018-03-17 ENCOUNTER — Ambulatory Visit: Payer: BC Managed Care – PPO | Admitting: Internal Medicine

## 2018-03-17 ENCOUNTER — Encounter: Payer: Self-pay | Admitting: Internal Medicine

## 2018-03-17 DIAGNOSIS — G4700 Insomnia, unspecified: Secondary | ICD-10-CM

## 2018-03-17 DIAGNOSIS — I10 Essential (primary) hypertension: Secondary | ICD-10-CM | POA: Diagnosis not present

## 2018-03-17 DIAGNOSIS — J4520 Mild intermittent asthma, uncomplicated: Secondary | ICD-10-CM | POA: Diagnosis not present

## 2018-03-17 MED ORDER — ALPRAZOLAM 0.5 MG PO TABS: 0.5000 mg | ORAL_TABLET | Freq: Every day | ORAL | 2 refills | Status: DC | PRN

## 2018-03-17 NOTE — Patient Instructions (Signed)
Consider low sodium diet of 3000 mg sodium daily (or 3 g). Look at the amount you are eating and if you can at least reduce by 20-30% this is enough to make a difference with blood pressure.   Hip Exercises Ask your health care provider which exercises are safe for you. Do exercises exactly as told by your health care provider and adjust them as directed. It is normal to feel mild stretching, pulling, tightness, or discomfort as you do these exercises, but you should stop right away if you feel sudden pain or your pain gets worse.Do not begin these exercises until told by your health care provider. STRETCHING AND RANGE OF MOTION EXERCISES These exercises warm up your muscles and joints and improve the movement and flexibility of your hip. These exercises also help to relieve pain, numbness, and tingling. Exercise A: Hamstrings, Supine  1. Lie on your back. 2. Loop a belt or towel over the ball of your left / rightfoot. The ball of your foot is on the walking surface, right under your toes. 3. Straighten your left / rightknee and slowly pull on the belt to raise your leg. ? Do not let your left / right knee bend while you do this. ? Keep your other leg flat on the floor. ? Raise the left / right leg until you feel a gentle stretch behind your left / right knee or thigh. 4. Hold this position for __________ seconds. 5. Slowly return your leg to the starting position. Repeat __________ times. Complete this stretch __________ times a day. Exercise B: Hip Rotators  1. Lie on your back on a firm surface. 2. Hold your left / right knee with your left / right hand. Hold your ankle with your other hand. 3. Gently pull your left / right knee and rotate your lower leg toward your other shoulder. ? Pull until you feel a stretch in your buttocks. ? Keep your hips and shoulders firmly planted while you do this stretch. 4. Hold this position for __________ seconds. Repeat __________ times. Complete  this stretch __________ times a day. Exercise C: V-Sit (Hamstrings and Adductors)  1. Sit on the floor with your legs extended in a large "V" shape. Keep your knees straight during this exercise. 2. Start with your head and chest upright, then bend at your waist to reach for your left foot (position A). You should feel a stretch in your right inner thigh. 3. Hold this position for __________ seconds. Then slowly return to the upright position. 4. Bend at your waist to reach forward (position B). You should feel a stretch behind both of your thighs and knees. 5. Hold this position for __________ seconds. Then slowly return to the upright position. 6. Bend at your waist to reach for your right foot (position C). You should feel a stretch in your left inner thigh. 7. Hold this position for __________ seconds. Then slowly return to the upright position. Repeat __________ times. Complete this stretch __________ times a day. Exercise D: Lunge (Hip Flexors)  1. Place your left / right knee on the floor and bend your other knee so that is directly over your ankle. You should be half-kneeling. 2. Keep good posture with your head over your shoulders. 3. Tighten your buttocks to point your tailbone downward. This helps your back to keep from arching too much. 4. You should feel a gentle stretch in the front of your left / right thigh and hip. If you do not feel  any resistance, slightly slide your other foot forward and then slowly lunge forward so your knee once again lines up over your ankle. 5. Make sure your tailbone continues to point downward. 6. Hold this position for __________ seconds. Repeat __________ times. Complete this stretch __________ times a day. STRENGTHENING EXERCISES These exercises build strength and endurance in your hip. Endurance is the ability to use your muscles for a long time, even after they get tired. Exercise E: Bridge (Hip Extensors)  1. Lie on your back on a firm surface  with your knees bent and your feet flat on the floor. 2. Tighten your buttocks muscles and lift your bottom off the floor until the trunk of your body is level with your thighs. ? Do not arch your back. ? You should feel the muscles working in your buttocks and the back of your thighs. If you do not feel these muscles, slide your feet 1-2 inches (2.5-5 cm) farther away from your buttocks. 3. Hold this position for __________ seconds. 4. Slowly lower your hips to the starting position. 5. Let your muscles relax completely between repetitions. 6. If this exercise is too easy, try doing it with your arms crossed over your chest. Repeat __________ times. Complete this exercise __________ times a day. Exercise F: Straight Leg Raises - Hip Abductors  1. Lie on your side with your left / right leg in the top position. Lie so your head, shoulder, knee, and hip line up with each other. You may bend your bottom knee to help you balance. 2. Roll your hips slightly forward, so your hips are stacked directly over each other and your left / right knee is facing forward. 3. Leading with your heel, lift your top leg 4-6 inches (10-15 cm). You should feel the muscles in your outer hip lifting. ? Do not let your foot drift forward. ? Do not let your knee roll toward the ceiling. 4. Hold this position for __________ seconds. 5. Slowly return to the starting position. 6. Let your muscles relax completely between repetitions. Repeat __________ times. Complete this exercise __________ times a day. Exercise G: Straight Leg Raises - Hip Adductors  1. Lie on your side with your left / right leg in the bottom position. Lie so your head, shoulder, knee, and hip line up. You may place your upper foot in front to help you balance. 2. Roll your hips slightly forward, so your hips are stacked directly over each other and your left / right knee is facing forward. 3. Tense the muscles in your inner thigh and lift your bottom  leg 4-6 inches (10-15 cm). 4. Hold this position for __________ seconds. 5. Slowly return to the starting position. 6. Let your muscles relax completely between repetitions. Repeat __________ times. Complete this exercise __________ times a day. Exercise H: Straight Leg Raises - Quadriceps  1. Lie on your back with your left / right leg extended and your other knee bent. 2. Tense the muscles in the front of your left / right thigh. When you do this, you should see your kneecap slide up or see increased dimpling just above your knee. 3. Tighten these muscles even more and raise your leg 4-6 inches (10-15 cm) off the floor. 4. Hold this position for __________ seconds. 5. Keep these muscles tense as you lower your leg. 6. Relax the muscles slowly and completely between repetitions. Repeat __________ times. Complete this exercise __________ times a day. Exercise I: Hip Abductors, Standing 1. Stann Mainland  one end of a rubber exercise band or tubing to a secure surface, such as a table or pole. 2. Loop the other end of the band or tubing around your left / right ankle. 3. Keeping your ankle with the band or tubing directly opposite of the secured end, step away until there is tension in the tubing or band. Hold onto a chair as needed for balance. 4. Lift your left / right leg out to your side. While you do this: ? Keep your back upright. ? Keep your shoulders over your hips. ? Keep your toes pointing forward. ? Make sure to use your hip muscles to lift your leg. Do not "throw" your leg or tip your body to lift your leg. 5. Hold this position for __________ seconds. 6. Slowly return to the starting position. Repeat __________ times. Complete this exercise __________ times a day. Exercise J: Squats (Quadriceps) 1. Stand in a door frame so your feet and knees are in line with the frame. You may place your hands on the frame for balance. 2. Slowly bend your knees and lower your hips like you are going to  sit in a chair. ? Keep your lower legs in a straight-up-and-down position. ? Do not let your hips go lower than your knees. ? Do not bend your knees lower than told by your health care provider. ? If your hip pain increases, do not bend as low. 3. Hold this position for ___________ seconds. 4. Slowly push with your legs to return to standing. Do not use your hands to pull yourself to standing. Repeat __________ times. Complete this exercise __________ times a day. This information is not intended to replace advice given to you by your health care provider. Make sure you discuss any questions you have with your health care provider. Document Released: 06/21/2005 Document Revised: 02/26/2016 Document Reviewed: 05/29/2015 Elsevier Interactive Patient Education  2018 [employer].    DASH Eating Plan DASH stands for "Dietary Approaches to Stop Hypertension." The DASH eating plan is a healthy eating plan that has been shown to reduce high blood pressure (hypertension). It may also reduce your risk for type 2 diabetes, heart disease, and stroke. The DASH eating plan may also help with weight loss. What are tips for following this plan? General guidelines  Avoid eating more than 2,300 mg (milligrams) of salt (sodium) a day. If you have hypertension, you may need to reduce your sodium intake to 1,500 mg a day.  Limit alcohol intake to no more than 1 drink a day for nonpregnant women and 2 drinks a day for men. One drink equals 12 oz of beer, 5 oz of wine, or 1 oz of hard liquor.  Work with your health care provider to maintain a healthy body weight or to lose weight. Ask what an ideal weight is for you.  Get at least 30 minutes of exercise that causes your heart to beat faster (aerobic exercise) most days of the week. Activities may include walking, swimming, or biking.  Work with your health care provider or diet and nutrition specialist (dietitian) to adjust your eating plan to your individual  calorie needs. Reading food labels  Check food labels for the amount of sodium per serving. Choose foods with less than 5 percent of the Daily Value of sodium. Generally, foods with less than 300 mg of sodium per serving fit into this eating plan.  To find whole grains, look for the word "whole" as the first word in  the ingredient list. Shopping  Buy products labeled as "low-sodium" or "no salt added."  Buy fresh foods. Avoid canned foods and premade or frozen meals. Cooking  Avoid adding salt when cooking. Use salt-free seasonings or herbs instead of table salt or sea salt. Check with your health care provider or pharmacist before using salt substitutes.  Do not fry foods. Cook foods using healthy methods such as baking, boiling, grilling, and broiling instead.  Cook with heart-healthy oils, such as olive, canola, soybean, or sunflower oil. Meal planning   Eat a balanced diet that includes: ? 5 or more servings of fruits and vegetables each day. At each meal, try to fill half of your plate with fruits and vegetables. ? Up to 6-8 servings of whole grains each day. ? Less than 6 oz of lean meat, poultry, or fish each day. A 3-oz serving of meat is about the same size as a deck of cards. One egg equals 1 oz. ? 2 servings of low-fat dairy each day. ? A serving of nuts, seeds, or beans 5 times each week. ? Heart-healthy fats. Healthy fats called Omega-3 fatty acids are found in foods such as flaxseeds and coldwater fish, like sardines, salmon, and mackerel.  Limit how much you eat of the following: ? Canned or prepackaged foods. ? Food that is high in trans fat, such as fried foods. ? Food that is high in saturated fat, such as fatty meat. ? Sweets, desserts, sugary drinks, and other foods with added sugar. ? Full-fat dairy products.  Do not salt foods before eating.  Try to eat at least 2 vegetarian meals each week.  Eat more home-cooked food and less restaurant, buffet, and fast  food.  When eating at a restaurant, ask that your food be prepared with less salt or no salt, if possible. What foods are recommended? The items listed may not be a complete list. Talk with your dietitian about what dietary choices are best for you. Grains Whole-grain or whole-wheat bread. Whole-grain or whole-wheat pasta. Brown rice. Orpah Cobb. Bulgur. Whole-grain and low-sodium cereals. Pita bread. Low-fat, low-sodium crackers. Whole-wheat flour tortillas. Vegetables Fresh or frozen vegetables (raw, steamed, roasted, or grilled). Low-sodium or reduced-sodium tomato and vegetable juice. Low-sodium or reduced-sodium tomato sauce and tomato paste. Low-sodium or reduced-sodium canned vegetables. Fruits All fresh, dried, or frozen fruit. Canned fruit in natural juice (without added sugar). Meat and other protein foods Skinless chicken or [place]. Ground chicken or [place]. Pork with fat trimmed off. Fish and seafood. Egg whites. Dried beans, peas, or lentils. Unsalted nuts, nut butters, and seeds. Unsalted canned beans. Lean cuts of beef with fat trimmed off. Low-sodium, lean deli meat. Dairy Low-fat (1%) or fat-free (skim) milk. Fat-free, low-fat, or reduced-fat cheeses. Nonfat, low-sodium ricotta or cottage cheese. Low-fat or nonfat yogurt. Low-fat, low-sodium cheese. Fats and oils Soft margarine without trans fats. Vegetable oil. Low-fat, reduced-fat, or light mayonnaise and salad dressings (reduced-sodium). Canola, safflower, olive, soybean, and sunflower oils. Avocado. Seasoning and other foods Herbs. Spices. Seasoning mixes without salt. Unsalted popcorn and pretzels. Fat-free sweets. What foods are not recommended? The items listed may not be a complete list. Talk with your dietitian about what dietary choices are best for you. Grains Baked goods made with fat, such as croissants, muffins, or some breads. Dry pasta or rice meal packs. Vegetables Creamed or fried vegetables. Vegetables  in a cheese sauce. Regular canned vegetables (not low-sodium or reduced-sodium). Regular canned tomato sauce and paste (not low-sodium or reduced-sodium).  Regular tomato and vegetable juice (not low-sodium or reduced-sodium). Rosita Fire. Olives. Fruits Canned fruit in a light or heavy syrup. Fried fruit. Fruit in cream or butter sauce. Meat and other protein foods Fatty cuts of meat. Ribs. Fried meat. Tomasa Blase. Sausage. Bologna and other processed lunch meats. Salami. Fatback. Hotdogs. Bratwurst. Salted nuts and seeds. Canned beans with added salt. Canned or smoked fish. Whole eggs or egg yolks. Chicken or [place] with skin. Dairy Whole or 2% milk, cream, and half-and-half. Whole or full-fat cream cheese. Whole-fat or sweetened yogurt. Full-fat cheese. Nondairy creamers. Whipped toppings. Processed cheese and cheese spreads. Fats and oils Butter. Stick margarine. Lard. Shortening. Ghee. Bacon fat. Tropical oils, such as coconut, palm kernel, or palm oil. Seasoning and other foods Salted popcorn and pretzels. Onion salt, garlic salt, seasoned salt, table salt, and sea salt. Worcestershire sauce. Tartar sauce. Barbecue sauce. Teriyaki sauce. Soy sauce, including reduced-sodium. Steak sauce. Canned and packaged gravies. Fish sauce. Oyster sauce. Cocktail sauce. Horseradish that you find on the shelf. Ketchup. Mustard. Meat flavorings and tenderizers. Bouillon cubes. Hot sauce and Tabasco sauce. Premade or packaged marinades. Premade or packaged taco seasonings. Relishes. Regular salad dressings. Where to find more information:  National Heart, Lung, and Blood Institute: PopSteam.is  American Heart Association: www.heart.org Summary  The DASH eating plan is a healthy eating plan that has been shown to reduce high blood pressure (hypertension). It may also reduce your risk for type 2 diabetes, heart disease, and stroke.  With the DASH eating plan, you should limit salt (sodium) intake to 2,300 mg a  day. If you have hypertension, you may need to reduce your sodium intake to 1,500 mg a day.  When on the DASH eating plan, aim to eat more fresh fruits and vegetables, whole grains, lean proteins, low-fat dairy, and heart-healthy fats.  Work with your health care provider or diet and nutrition specialist (dietitian) to adjust your eating plan to your individual calorie needs. This information is not intended to replace advice given to you by your health care provider. Make sure you discuss any questions you have with your health care provider. Document Released: 05/23/2011 Document Revised: 05/27/2016 Document Reviewed: 05/27/2016 Elsevier Interactive Patient Education  [employer].

## 2018-03-17 NOTE — Progress Notes (Signed)
   Subjective:    Patient ID: [name] [name], female    DOB: 02/25/1956, 62 y.o.   MRN: [id number]  HPI The patient is a 62 YO female coming in for follow up of anxiety (still taking effexor which helps a lot of the time, uses xanax as well sometimes to help her sleep more than anxiety, she has a lot of problems with lots of thoughts at night time which prevent her from falling asleep, denies SI/HI, overall satisfied with level of control) as well as her asthma (she has taken advair some lately with the allergy season, uses albuterol rarely if ever for SOB, denies seeking care at ER or hospital for flare since last visit, no night time awakenings with SOB, denies chronic cough) as well as blood pressure (her readings at home are running about 140s/80s most of the time, she denies headaches or chest pains, denies low sodium diet, some walking but not consistent).   Review of Systems  Constitutional: Negative.   HENT: Negative.   Eyes: Negative.   Respiratory: Negative for cough, chest tightness and shortness of breath.   Cardiovascular: Negative for chest pain, palpitations and leg swelling.  Gastrointestinal: Negative for abdominal distention, abdominal pain, constipation, diarrhea, nausea and vomiting.  Musculoskeletal: Negative.   Skin: Negative.   Neurological: Negative.   Psychiatric/Behavioral: Positive for sleep disturbance. Negative for behavioral problems, confusion, decreased concentration, dysphoric mood and hallucinations. The patient is not nervous/anxious and is not hyperactive.       Objective:   Physical Exam  Constitutional: She is oriented to person, place, and time. She appears well-developed and well-nourished.  HENT:  Head: Normocephalic and atraumatic.  Eyes: EOM are normal.  Neck: Normal range of motion.  Cardiovascular: Normal rate and regular rhythm.  Pulmonary/Chest: Effort normal and breath sounds normal. No respiratory distress. She has no wheezes. She has no  rales.  Abdominal: Soft. Bowel sounds are normal. She exhibits no distension. There is no tenderness. There is no rebound.  Musculoskeletal: She exhibits no edema.  Neurological: She is alert and oriented to person, place, and time. Coordination normal.  Skin: Skin is warm and dry.  Psychiatric: She has a normal mood and affect.   Vitals:   03/17/18 0923  BP: 132/88  Pulse: 80  Temp: 98.4 F (36.9 C)  TempSrc: Oral  SpO2: 98%  Weight: 140 lb (63.5 kg)  Height: 5\' 1"  (1.549 m)      Assessment & Plan:

## 2018-03-19 ENCOUNTER — Encounter: Payer: Self-pay | Admitting: Internal Medicine

## 2018-03-19 NOTE — Assessment & Plan Note (Signed)
Uses xanax rarely for insomnia and anxiety and is not overusing at this time. We discussed risks and benefits to therapy including risk of addiction, dependence, falls, memory changes. She wishes to continue with therapy due to increase in QOL.

## 2018-03-19 NOTE — Assessment & Plan Note (Signed)
BP is borderline off meds currently. She would like to work with diet and exercise to help. Spent 3-4 minutes talking to her about dash diet and exercise as a way to decrease her blood pressure.

## 2018-03-19 NOTE — Assessment & Plan Note (Signed)
No flare currently, advair during allergy season and as needed. Albuterol as needed.

## 2018-05-08 ENCOUNTER — Other Ambulatory Visit: Payer: Self-pay | Admitting: Internal Medicine

## 2018-09-16 ENCOUNTER — Other Ambulatory Visit: Payer: Self-pay | Admitting: Internal Medicine

## 2018-09-16 DIAGNOSIS — Z8709 Personal history of other diseases of the respiratory system: Secondary | ICD-10-CM

## 2018-09-16 MED ORDER — ALBUTEROL SULFATE HFA 108 (90 BASE) MCG/ACT IN AERS: 2.0000 | INHALATION_SPRAY | Freq: Four times a day (QID) | RESPIRATORY_TRACT | 2 refills | Status: DC | PRN

## 2018-09-16 MED ORDER — VENLAFAXINE HCL ER 150 MG PO CP24: 150.0000 mg | ORAL_CAPSULE | Freq: Every day | ORAL | 1 refills | Status: DC

## 2018-09-17 ENCOUNTER — Ambulatory Visit: Payer: BC Managed Care – PPO | Admitting: Internal Medicine

## 2018-09-21 ENCOUNTER — Other Ambulatory Visit: Payer: Self-pay | Admitting: Internal Medicine

## 2018-09-21 NOTE — Telephone Encounter (Signed)
Control database checked last refill: 08/17/2018 LOV: 03/17/2018 NOV:11/17/2018

## 2018-11-17 ENCOUNTER — Other Ambulatory Visit: Payer: Self-pay | Admitting: Internal Medicine

## 2018-11-17 ENCOUNTER — Telehealth: Payer: Self-pay | Admitting: Emergency Medicine

## 2018-11-17 ENCOUNTER — Other Ambulatory Visit: Payer: Self-pay

## 2018-11-17 ENCOUNTER — Ambulatory Visit (INDEPENDENT_AMBULATORY_CARE_PROVIDER_SITE_OTHER): Payer: BC Managed Care – PPO | Admitting: Internal Medicine

## 2018-11-17 ENCOUNTER — Encounter: Payer: Self-pay | Admitting: Internal Medicine

## 2018-11-17 ENCOUNTER — Other Ambulatory Visit (INDEPENDENT_AMBULATORY_CARE_PROVIDER_SITE_OTHER): Payer: BC Managed Care – PPO

## 2018-11-17 VITALS — BP 122/90 | HR 78 | Temp 97.7°F | Ht 61.0 in | Wt 153.0 lb

## 2018-11-17 DIAGNOSIS — Z0000 Encounter for general adult medical examination without abnormal findings: Secondary | ICD-10-CM

## 2018-11-17 DIAGNOSIS — I10 Essential (primary) hypertension: Secondary | ICD-10-CM

## 2018-11-17 DIAGNOSIS — G4700 Insomnia, unspecified: Secondary | ICD-10-CM

## 2018-11-17 DIAGNOSIS — Z23 Encounter for immunization: Secondary | ICD-10-CM

## 2018-11-17 DIAGNOSIS — E782 Mixed hyperlipidemia: Secondary | ICD-10-CM | POA: Diagnosis not present

## 2018-11-17 DIAGNOSIS — J4520 Mild intermittent asthma, uncomplicated: Secondary | ICD-10-CM

## 2018-11-17 LAB — COMPREHENSIVE METABOLIC PANEL
ALT: 20 U/L (ref 0–35)
AST: 23 U/L (ref 0–37)
Albumin: 3.8 g/dL (ref 3.5–5.2)
Alkaline Phosphatase: 120 U/L — ABNORMAL HIGH (ref 39–117)
BUN: 12 mg/dL (ref 6–23)
CO2: 32 mEq/L (ref 19–32)
Calcium: 8.6 mg/dL (ref 8.4–10.5)
Chloride: 106 mEq/L (ref 96–112)
Creatinine, Ser: 0.53 mg/dL (ref 0.40–1.20)
GFR: 116.19 mL/min (ref >60.00)
Glucose, Bld: 84 mg/dL (ref 70–99)
Potassium: 2.7 mEq/L — CL (ref 3.5–5.1)
Sodium: 145 mEq/L (ref 135–145)
Total Bilirubin: 0.5 mg/dL (ref 0.2–1.2)
Total Protein: 6.5 g/dL (ref 6.0–8.3)

## 2018-11-17 LAB — LIPID PANEL
Cholesterol: 174 mg/dL (ref 0–200)
HDL: 64.9 mg/dL (ref >39.00)
LDL Cholesterol: 88 mg/dL (ref 0–99)
NonHDL: 108.92
Total CHOL/HDL Ratio: 3
Triglycerides: 105 mg/dL (ref 0.0–149.0)
VLDL: 21 mg/dL (ref 0.0–40.0)

## 2018-11-17 LAB — CBC
HCT: 37.5 % (ref 36.0–46.0)
Hemoglobin: 13.1 g/dL (ref 12.0–15.0)
MCHC: 34.9 g/dL (ref 30.0–36.0)
MCV: 86.5 fl (ref 78.0–100.0)
Platelets: 313 10*3/uL (ref 150.0–400.0)
RBC: 4.33 Mil/uL (ref 3.87–5.11)
RDW: 12.9 % (ref 11.5–15.5)
WBC: 5.4 10*3/uL (ref 4.0–10.5)

## 2018-11-17 LAB — HEMOGLOBIN A1C: Hgb A1c MFr Bld: 5.2 % (ref 4.6–6.5)

## 2018-11-17 MED ORDER — POTASSIUM CHLORIDE CRYS ER 20 MEQ PO TBCR: 40.0000 meq | EXTENDED_RELEASE_TABLET | Freq: Every day | ORAL | 3 refills | Status: DC

## 2018-11-17 NOTE — Telephone Encounter (Signed)
Addressed via lab results 

## 2018-11-17 NOTE — Telephone Encounter (Signed)
Received call from the lab with Critical lab value- potassium at 2.7

## 2018-11-17 NOTE — Patient Instructions (Signed)

## 2018-11-17 NOTE — Progress Notes (Signed)
   Subjective:   Patient ID: [name] [name], female    DOB: July 28, 1955, 63 y.o.   MRN: [id number]  HPI The patient is a 63 YO female coming in for physical.   PMH, FMH, social history reviewed and updated  Review of Systems  Constitutional: Negative.   HENT: Negative.   Eyes: Negative.   Respiratory: Negative for cough, chest tightness and shortness of breath.   Cardiovascular: Negative for chest pain, palpitations and leg swelling.  Gastrointestinal: Negative for abdominal distention, abdominal pain, constipation, diarrhea, nausea and vomiting.  Musculoskeletal: Negative.   Skin: Negative.   Neurological: Negative.   Psychiatric/Behavioral: Negative.     Objective:  Physical Exam Constitutional:      Appearance: She is well-developed.  HENT:     Head: Normocephalic and atraumatic.  Neck:     Musculoskeletal: Normal range of motion.  Cardiovascular:     Rate and Rhythm: Normal rate and regular rhythm.  Pulmonary:     Effort: Pulmonary effort is normal. No respiratory distress.     Breath sounds: Normal breath sounds. No wheezing or rales.  Abdominal:     General: Bowel sounds are normal. There is no distension.     Palpations: Abdomen is soft.     Tenderness: There is no abdominal tenderness. There is no rebound.  Skin:    General: Skin is warm and dry.  Neurological:     Mental Status: She is alert and oriented to person, place, and time.     Coordination: Coordination normal.     Vitals:   11/17/18 1017  BP: 122/90  Pulse: 78  Temp: 97.7 F (36.5 C)  TempSrc: Oral  SpO2: 98%  Weight: 153 lb (69.4 kg)  Height: 5\' 1"  (1.549 m)    Assessment & Plan:  Shingrix IM given at visit

## 2018-11-18 ENCOUNTER — Encounter: Payer: Self-pay | Admitting: Internal Medicine

## 2018-11-18 NOTE — Assessment & Plan Note (Signed)
Well controlled with advair as needed and allergy treatment. No flare today.

## 2018-11-18 NOTE — Assessment & Plan Note (Signed)
BP at goal off meds at this time. Checking CMP and adjust as needed.

## 2018-11-18 NOTE — Assessment & Plan Note (Signed)
Taking xanax for sleep and is aware of the risks and reviewed those with her today. She is willing to accept given the increase in QOL. Refill as needed.

## 2018-11-18 NOTE — Assessment & Plan Note (Signed)
Checking lipid panel and not on meds at this time.

## 2018-11-18 NOTE — Assessment & Plan Note (Signed)
Flu shot counseled. Pneumonia up to date. 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 Tetanus up to date. Cologuard ordered. Mammogram needs and counseled but she defers given pandemic, pap smear needs done and she will get. Counseled about sun safety and mole surveillance. Counseled about the dangers of distracted driving. Given 10 year screening recommendations.

## 2019-01-14 LAB — COLOGUARD: Cologuard: POSITIVE — AB

## 2019-01-15 ENCOUNTER — Encounter: Payer: Self-pay | Admitting: Internal Medicine

## 2019-01-15 NOTE — Progress Notes (Signed)
Abstracted and sent to scan Patient informed 

## 2019-01-18 ENCOUNTER — Other Ambulatory Visit: Payer: Self-pay | Admitting: Internal Medicine

## 2019-01-18 DIAGNOSIS — R195 Other fecal abnormalities: Secondary | ICD-10-CM

## 2019-01-19 ENCOUNTER — Ambulatory Visit (INDEPENDENT_AMBULATORY_CARE_PROVIDER_SITE_OTHER): Payer: BC Managed Care – PPO

## 2019-01-19 ENCOUNTER — Other Ambulatory Visit: Payer: Self-pay

## 2019-01-19 DIAGNOSIS — Z299 Encounter for prophylactic measures, unspecified: Secondary | ICD-10-CM

## 2019-01-19 DIAGNOSIS — Z23 Encounter for immunization: Secondary | ICD-10-CM | POA: Diagnosis not present

## 2019-01-27 ENCOUNTER — Telehealth: Payer: Self-pay

## 2019-01-27 ENCOUNTER — Encounter: Payer: Self-pay | Admitting: Gastroenterology

## 2019-01-27 NOTE — Telephone Encounter (Signed)
LVM for patient informing her cologuard was positive which means there was blood found in her stool so we have placed a referral for GI for a colonoscopy. Patient will be getting a call to schedule

## 2019-01-27 NOTE — Telephone Encounter (Signed)
Spoke to pt and gave her [facility]'s number

## 2019-01-27 NOTE — Telephone Encounter (Signed)
Patient called back and was wanting to know where the referral was going to be sent so she can call and make an appointment. I looked at the referral and did not see that it has been sent anywhere, do you know if this is going to be sent to [place]?

## 2019-01-27 NOTE — Telephone Encounter (Signed)
Copied from [place] [phone]. Topic: General - Other >> Jan 27, 2019  9:23 AM Sheran Luz wrote: Patient requesting call back from [place] regarding scheduling of cologuard.

## 2019-02-03 ENCOUNTER — Ambulatory Visit (AMBULATORY_SURGERY_CENTER): Payer: Self-pay | Admitting: *Deleted

## 2019-02-03 ENCOUNTER — Other Ambulatory Visit: Payer: Self-pay

## 2019-02-03 VITALS — Temp 97.3°F | Ht 61.0 in | Wt 151.0 lb

## 2019-02-03 DIAGNOSIS — R195 Other fecal abnormalities: Secondary | ICD-10-CM

## 2019-02-03 MED ORDER — NA SULFATE-K SULFATE-MG SULF 17.5-3.13-1.6 GM/177ML PO SOLN: 1.0000 | Freq: Once | ORAL | 0 refills | Status: AC

## 2019-02-03 NOTE — Progress Notes (Signed)
No egg or soy allergy known to patient  No issues with past sedation with any surgeries  or procedures, no intubation problems  No diet pills per patient No home 02 use per patient  No blood thinners per patient  Pt denies issues with constipation  No A fib or A flutter   

## 2019-02-10 ENCOUNTER — Encounter: Payer: Self-pay | Admitting: Gastroenterology

## 2019-02-16 ENCOUNTER — Telehealth: Payer: Self-pay

## 2019-02-16 NOTE — Telephone Encounter (Signed)
Covid-19 screening questions   Do you now or have you had a fever in the last 14 days?  Do you have any respiratory symptoms of shortness of breath or cough now or in the last 14 days?  Do you have any family members or close contacts with diagnosed or suspected Covid-19 in the past 14 days?  Have you been tested for Covid-19 and found to be positive?       

## 2019-02-17 ENCOUNTER — Telehealth: Payer: Self-pay | Admitting: *Deleted

## 2019-02-17 ENCOUNTER — Ambulatory Visit (AMBULATORY_SURGERY_CENTER): Payer: BC Managed Care – PPO | Admitting: Gastroenterology

## 2019-02-17 ENCOUNTER — Encounter: Payer: Self-pay | Admitting: Gastroenterology

## 2019-02-17 ENCOUNTER — Other Ambulatory Visit: Payer: Self-pay

## 2019-02-17 VITALS — BP 170/89 | HR 60 | Temp 98.4°F | Resp 15 | Ht 61.0 in | Wt 151.0 lb

## 2019-02-17 DIAGNOSIS — Z538 Procedure and treatment not carried out for other reasons: Secondary | ICD-10-CM

## 2019-02-17 DIAGNOSIS — R195 Other fecal abnormalities: Secondary | ICD-10-CM | POA: Diagnosis not present

## 2019-02-17 MED ORDER — FLEET ENEMA 7-19 GM/118ML RE ENEM
1.0000 | ENEMA | Freq: Once | RECTAL | Status: AC
  Administered 2019-02-17: 09:00:00 1 via RECTAL

## 2019-02-17 MED ORDER — SODIUM CHLORIDE 0.9 % IV SOLN: 500.0000 mL | Freq: Once | INTRAVENOUS | Status: DC

## 2019-02-17 NOTE — Patient Instructions (Signed)
Clear liquids today. Read and follow instructions for colonoscopy scheduled for tomorrow.      YOU HAD AN ENDOSCOPIC PROCEDURE TODAY AT [facility]:   Refer to the procedure report that was given to you for any specific questions about what was found during the examination.  If the procedure report does not answer your questions, please call your gastroenterologist to clarify.  If you requested that your care partner not be given the details of your procedure findings, then the procedure report has been included in a sealed envelope for you to review at your convenience later.  YOU SHOULD EXPECT: Some feelings of bloating in the abdomen. Passage of more gas than usual.  Walking can help get rid of the air that was put into your GI tract during the procedure and reduce the bloating. If you had a lower endoscopy (such as a colonoscopy or flexible sigmoidoscopy) you may notice spotting of blood in your stool or on the toilet paper. If you underwent a bowel prep for your procedure, you may not have a normal bowel movement for a few days.  Please Note:  You might notice some irritation and congestion in your nose or some drainage.  This is from the oxygen used during your procedure.  There is no need for concern and it should clear up in a day or so.  SYMPTOMS TO REPORT IMMEDIATELY:   Following lower endoscopy (colonoscopy or flexible sigmoidoscopy):  Excessive amounts of blood in the stool  Significant tenderness or worsening of abdominal pains  Swelling of the abdomen that is new, acute  Fever of 100F or higher   For urgent or emergent issues, a gastroenterologist can be reached at any hour by calling [phone].   DIET:   Drink plenty of fluids but you should avoid alcoholic beverages for 24 hours.  ACTIVITY:  You should plan to take it easy for the rest of today and you should NOT DRIVE or use heavy machinery until tomorrow (because of the sedation medicines used  during the test).    FOLLOW UP: Our staff will call the number listed on your records 48-72 hours following your procedure to check on you and address any questions or concerns that you may have regarding the information given to you following your procedure. If we do not reach you, we will leave a message.  We will attempt to reach you two times.  During this call, we will ask if you have developed any symptoms of COVID 19. If you develop any symptoms (ie: fever, flu-like symptoms, shortness of breath, cough etc.) before then, please call [phone].  If you test positive for Covid 19 in the 2 weeks post procedure, please call and report this information to [place].    If any biopsies were taken you will be contacted by phone or by letter within the next 1-3 weeks.  Please call us at [phone] if you have not heard about the biopsies in 3 weeks.    SIGNATURES/CONFIDENTIALITY: You and/or your care partner have signed paperwork which will be entered into your electronic medical record.  These signatures attest to the fact that that the information above on your After Visit Summary has been reviewed and is understood.  Full responsibility of the confidentiality of this discharge information lies with you and/or your care-partner.

## 2019-02-17 NOTE — Progress Notes (Signed)
Pt's states no medical or surgical changes since previsit or office visit. 

## 2019-02-17 NOTE — Progress Notes (Signed)
PT taken to PACU. Monitors in place. VSS. Report given to RN. 

## 2019-02-17 NOTE — Progress Notes (Signed)
Temperature taken by S.P., VS taken by C.W. 

## 2019-02-17 NOTE — Progress Notes (Signed)
Patient states her last BM consisted of Brown mushy stool and that this is the result she had prior to her last colonoscopy. Dr. Silverio Decamp notified and order taken for a Fleets enema X1, patient given instructions and self-administered enema at 0930 with results of liquid light brown stool. Dr. Silverio Decamp notified.

## 2019-02-17 NOTE — Telephone Encounter (Signed)
Covid-19 screening questions   Do you now or have you had a fever in the last 14 days no   Do you have any respiratory symptoms of shortness of breath or cough now or in the last 14 days no  Do you have any family members or close contacts with diagnosed or suspected Covid-19 in the past 14 days no  Have you been tested for Covid-19 and found to be positive no          

## 2019-02-17 NOTE — Progress Notes (Signed)
Prep instructions reviewed with pt.  No questions at this time.

## 2019-02-17 NOTE — Op Note (Signed)
[facility] Patient Name: [name] AidSharon [name] Procedure Date: 02/17/2019 9:52 AM MRN: [id number] Endoscopist: Napoleon FormKavitha V. Nandigam , MD Age: [age] Referring MD:  Date of Birth: [birth year] Gender: Female Account #: [id number] Procedure:                Colonoscopy Indications:              Screening for colorectal malignant neoplasm Medicines:                Monitored Anesthesia Care Procedure:                Pre-Anesthesia Assessment:                           - Prior to the procedure, a History and Physical                            was performed, and patient medications and                            allergies were reviewed. The patient's tolerance of                            previous anesthesia was also reviewed. The risks                            and benefits of the procedure and the sedation                            options and risks were discussed with the patient.                            All questions were answered, and informed consent                            was obtained. Prior Anticoagulants: The patient has                            taken no previous anticoagulant or antiplatelet                            agents. ASA Grade Assessment: II - A patient with                            mild systemic disease. After reviewing the risks                            and benefits, the patient was deemed in                            satisfactory condition to undergo the procedure.                           After obtaining informed consent, the colonoscope  was passed under direct vision. Throughout the                            procedure, the patient's blood pressure, pulse, and                            oxygen saturations were monitored continuously. The                            Colonoscope was introduced through the anus with                            the intention of advancing to the splenic flexure.                            The scope was  advanced to the descending colon                            before the procedure was aborted. Medications were                            given. The colonoscopy was technically difficult                            and complex due to inadequate bowel prep. The                            patient tolerated the procedure well. The quality                            of the bowel preparation was inadequate. Scope In: 9:55:03 AM Scope Out: 10:05:04 AM Scope Withdrawal Time: 0 hours 1 minute 5 seconds  Total Procedure Duration: 0 hours 10 minutes 1 second  Findings:                 The perianal and digital rectal examinations were                            normal.                           A moderate amount of stool was found in the entire                            colon, interfering with visualization. Lavage of                            the area was performed, resulting in incomplete                            clearance with continued poor visualization. Complications:            No immediate complications. Estimated Blood Loss:     Estimated blood loss: none. Impression:               -  Preparation of the colon was inadequate.                           - Stool in the entire examined colon.                           - No specimens collected. Recommendation:           - Patient has a contact number available for                            emergencies. The signs and symptoms of potential                            delayed complications were discussed with the                            patient. Return to normal activities tomorrow.                            Written discharge instructions were provided to the                            patient.                           - Resume previous diet.                           - Continue present medications.                           - Repeat colonoscopy with 2 days Miralax bowel prep                            at the next available appointment  because the bowel                            preparation was suboptimal. Napoleon Form, MD 02/17/2019 10:11:53 AM This report has been signed electronically.

## 2019-02-18 ENCOUNTER — Encounter: Payer: Self-pay | Admitting: Gastroenterology

## 2019-02-18 ENCOUNTER — Ambulatory Visit (AMBULATORY_SURGERY_CENTER): Payer: BC Managed Care – PPO | Admitting: Gastroenterology

## 2019-02-18 VITALS — BP 147/72 | HR 69 | Temp 98.7°F | Resp 15 | Ht 61.0 in | Wt 153.0 lb

## 2019-02-18 DIAGNOSIS — R195 Other fecal abnormalities: Secondary | ICD-10-CM

## 2019-02-18 MED ORDER — METRONIDAZOLE 500 MG PO TABS: 500.0000 mg | ORAL_TABLET | Freq: Two times a day (BID) | ORAL | 0 refills | Status: DC

## 2019-02-18 MED ORDER — SODIUM CHLORIDE 0.9 % IV SOLN: 500.0000 mL | Freq: Once | INTRAVENOUS | Status: DC

## 2019-02-18 MED ORDER — CIPROFLOXACIN HCL 500 MG PO TABS: 500.0000 mg | ORAL_TABLET | Freq: Two times a day (BID) | ORAL | 0 refills | Status: DC

## 2019-02-18 NOTE — Progress Notes (Signed)
Called to room to assist during endoscopic procedure.  Patient ID and intended procedure confirmed with present staff. Received instructions for my participation in the procedure from the performing physician.  

## 2019-02-18 NOTE — Op Note (Signed)
[facility] Patient Name: [name] AidSharon [name] Procedure Date: 02/18/2019 10:40 AM MRN: [id number] Endoscopist: Napoleon FormKavitha V. Scotlynn Noyes , MD Age: [age] Referring MD:  Date of Birth: Nov 30, 1955 Gender: Female Account #: [id number] Procedure:                Colonoscopy Indications:              Screening for colorectal malignant neoplasm Medicines:                Monitored Anesthesia Care Procedure:                Pre-Anesthesia Assessment:                           - Prior to the procedure, a History and Physical                            was performed, and patient medications and                            allergies were reviewed. The patient's tolerance of                            previous anesthesia was also reviewed. The risks                            and benefits of the procedure and the sedation                            options and risks were discussed with the patient.                            All questions were answered, and informed consent                            was obtained. Prior Anticoagulants: The patient has                            taken no previous anticoagulant or antiplatelet                            agents. ASA Grade Assessment: III - A patient with                            severe systemic disease. After reviewing the risks                            and benefits, the patient was deemed in                            satisfactory condition to undergo the procedure.                           After obtaining informed consent, the colonoscope  was passed under direct vision. Throughout the                            procedure, the patient's blood pressure, pulse, and                            oxygen saturations were monitored continuously. The                            Colonoscope was introduced through the anus and                            advanced to the the cecum, identified by                            appendiceal orifice  and ileocecal valve. The                            colonoscopy was performed without difficulty. The                            patient tolerated the procedure well. The quality                            of the bowel preparation was good. The ileocecal                            valve, appendiceal orifice, and rectum were                            photographed. Scope In: 10:53:59 AM Scope Out: 11:15:47 AM Scope Withdrawal Time: 0 hours 14 minutes 3 seconds  Total Procedure Duration: 0 hours 21 minutes 48 seconds  Findings:                 The perianal and digital rectal examinations were                            normal.                           Scattered small and large-mouthed diverticula were                            found in the sigmoid colon, descending colon,                            transverse colon and ascending colon.                           An area of congested erythematous mucosa with mucus                            was found at the appendiceal orifice suggestive of  mucinous neoplasm. Biopsies were taken with a cold                            forceps for histology.                           Non-bleeding internal hemorrhoids were found during                            retroflexion. The hemorrhoids were medium-sized. Complications:            No immediate complications. Estimated Blood Loss:     Estimated blood loss was minimal. Impression:               - Moderate diverticulosis in the sigmoid colon, in                            the descending colon, in the transverse colon and                            in the ascending colon.                           - Congested mucosa at the appendiceal orifice.                            Biopsied.                           - Non-bleeding internal hemorrhoids. Recommendation:           - Patient has a contact number available for                            emergencies. The signs and symptoms of  potential                            delayed complications were discussed with the                            patient. Return to normal activities tomorrow.                            Written discharge instructions were provided to the                            patient.                           - Resume previous diet.                           - Continue present medications.                           - Await pathology results.                           -  Repeat colonoscopy after studies are complete for                            surveillance based on pathology results. Napoleon FormKavitha V. Kendry Pfarr, MD 02/18/2019 11:21:57 AM This report has been signed electronically.

## 2019-02-18 NOTE — Patient Instructions (Addendum)
YOU HAD AN ENDOSCOPIC PROCEDURE TODAY AT [facility]:   Refer to the procedure report that was given to you for any specific questions about what was found during the examination.  If the procedure report does not answer your questions, please call your gastroenterologist to clarify.  If you requested that your care partner not be given the details of your procedure findings, then the procedure report has been included in a sealed envelope for you to review at your convenience later.  YOU SHOULD EXPECT: Some feelings of bloating in the abdomen. Passage of more gas than usual.  Walking can help get rid of the air that was put into your GI tract during the procedure and reduce the bloating. If you had a lower endoscopy (such as a colonoscopy or flexible sigmoidoscopy) you may notice spotting of blood in your stool or on the toilet paper. If you underwent a bowel prep for your procedure, you may not have a normal bowel movement for a few days.  Please Note:  You might notice some irritation and congestion in your nose or some drainage.  This is from the oxygen used during your procedure.  There is no need for concern and it should clear up in a day or so.  SYMPTOMS TO REPORT IMMEDIATELY:   Following lower endoscopy (colonoscopy or flexible sigmoidoscopy):  Excessive amounts of blood in the stool  Significant tenderness or worsening of abdominal pains  Swelling of the abdomen that is new, acute  Fever of 100F or higher   For urgent or emergent issues, a gastroenterologist can be reached at any hour by calling [phone].   DIET:  We do recommend a small meal at first, but then you may proceed to your regular diet.  Drink plenty of fluids but you should avoid alcoholic beverages for 24 hours.  MEDICATIONS: Continue present medications. Use Ciprofloxacin 500 mg by mouth twice daily for 7 days and Flagyl 500 mg by mouth twice daily for 7 days.  Please see handouts given to you  by your recovery nurse.  ACTIVITY:  You should plan to take it easy for the rest of today and you should NOT DRIVE or use heavy machinery until tomorrow (because of the sedation medicines used during the test).    FOLLOW UP: Our staff will call the number listed on your records 48-72 hours following your procedure to check on you and address any questions or concerns that you may have regarding the information given to you following your procedure. If we do not reach you, we will leave a message.  We will attempt to reach you two times.  During this call, we will ask if you have developed any symptoms of COVID 19. If you develop any symptoms (ie: fever, flu-like symptoms, shortness of breath, cough etc.) before then, please call [phone].  If you test positive for Covid 19 in the 2 weeks post procedure, please call and report this information to [place].    If any biopsies were taken you will be contacted by phone or by letter within the next 1-3 weeks.  Please call us at [phone] if you have not heard about the biopsies in 3 weeks.   Thank you for allowing [place] to provide for your healthcare needs today.   SIGNATURES/CONFIDENTIALITY: You and/or your care partner have signed paperwork which will be entered into your electronic medical record.  These signatures attest to the fact that that the information above on your After  Visit Summary has been reviewed and is understood.  Full responsibility of the confidentiality of this discharge information lies with you and/or your care-partner. 

## 2019-02-18 NOTE — Progress Notes (Signed)
Pt Drowsy. VSS. To PACU, report to RN. No anesthetic complications noted.  

## 2019-02-19 ENCOUNTER — Telehealth: Payer: Self-pay

## 2019-02-19 NOTE — Telephone Encounter (Signed)
Attempted to reach pt. With follow-up call following endoscopic procedure 02/17/2019.  LM on pt. Ans. Machine.  Will try to reach pt. Again later today.

## 2019-02-19 NOTE — Telephone Encounter (Signed)
Left message on 2nd follow up call. 

## 2019-02-23 ENCOUNTER — Telehealth: Payer: Self-pay

## 2019-02-23 NOTE — Telephone Encounter (Signed)
  Follow up Call-  Call back number 02/18/2019 02/17/2019  Post procedure Call Back phone  # [phone]  Permission to leave phone message Yes Yes  Some recent data might be hidden     Patient questions:  Do you have a fever, pain , or abdominal swelling? No. Pain Score  0 *  Have you tolerated food without any problems? Yes.    Have you been able to return to your normal activities? Yes.    Do you have any questions about your discharge instructions: Diet   No. Medications  No. Follow up visit  No.  Do you have questions or concerns about your Care? No.  Actions: * If pain score is 4 or above: No action needed, pain <4.  1. Have you developed a fever since your procedure? no  2.   Have you had an respiratory symptoms (SOB or cough) since your procedure? no  3.   Have you tested positive for COVID 19 since your procedure no  4.   Have you had any family members/close contacts diagnosed with the COVID 19 since your procedure?  no   If yes to any of these questions please route to Joylene John, RN and Alphonsa Gin, Therapist, sports.

## 2019-03-01 ENCOUNTER — Encounter: Payer: Self-pay | Admitting: Gastroenterology

## 2019-03-12 ENCOUNTER — Other Ambulatory Visit: Payer: Self-pay | Admitting: Internal Medicine

## 2019-03-15 NOTE — Telephone Encounter (Signed)
[place] Controlled Database Checked Last filled: 01/21/19 # 30 LOV w/PCP: 11/17/18 Next appt: 11/18/19  Please advise in PCP's absence. Thanks!

## 2019-06-07 ENCOUNTER — Other Ambulatory Visit: Payer: Self-pay | Admitting: Internal Medicine

## 2019-08-02 ENCOUNTER — Other Ambulatory Visit: Payer: Self-pay | Admitting: Internal Medicine

## 2019-08-02 NOTE — Telephone Encounter (Signed)
Check [place] registry last filled 06/17/2019. MD is out of the office pls advise on refill.Marland KitchenRaechel Chute

## 2019-08-20 ENCOUNTER — Encounter: Payer: Self-pay | Admitting: Internal Medicine

## 2019-08-20 ENCOUNTER — Other Ambulatory Visit: Payer: Self-pay

## 2019-08-20 ENCOUNTER — Ambulatory Visit (INDEPENDENT_AMBULATORY_CARE_PROVIDER_SITE_OTHER): Payer: BC Managed Care – PPO | Admitting: Internal Medicine

## 2019-08-20 VITALS — BP 136/88 | HR 94 | Temp 98.3°F | Ht 61.0 in | Wt 149.0 lb

## 2019-08-20 DIAGNOSIS — M79642 Pain in left hand: Secondary | ICD-10-CM

## 2019-08-20 DIAGNOSIS — R399 Unspecified symptoms and signs involving the genitourinary system: Secondary | ICD-10-CM | POA: Diagnosis not present

## 2019-08-20 DIAGNOSIS — M79641 Pain in right hand: Secondary | ICD-10-CM | POA: Diagnosis not present

## 2019-08-20 DIAGNOSIS — N3941 Urge incontinence: Secondary | ICD-10-CM | POA: Insufficient documentation

## 2019-08-20 DIAGNOSIS — N200 Calculus of kidney: Secondary | ICD-10-CM | POA: Insufficient documentation

## 2019-08-20 LAB — POCT URINALYSIS DIPSTICK
Blood, UA: NEGATIVE
Glucose, UA: NEGATIVE
Ketones, UA: NEGATIVE
Leukocytes, UA: NEGATIVE
Nitrite, UA: NEGATIVE
Protein, UA: POSITIVE — AB
Spec Grav, UA: 1.025 (ref 1.010–1.025)
Urobilinogen, UA: NEGATIVE E.U./dL — AB
pH, UA: 6 (ref 5.0–8.0)

## 2019-08-20 LAB — COMPREHENSIVE METABOLIC PANEL
ALT: 24 U/L (ref 0–35)
AST: 25 U/L (ref 0–37)
Albumin: 3.9 g/dL (ref 3.5–5.2)
Alkaline Phosphatase: 134 U/L — ABNORMAL HIGH (ref 39–117)
BUN: 13 mg/dL (ref 6–23)
CO2: 29 mEq/L (ref 19–32)
Calcium: 8.8 mg/dL (ref 8.4–10.5)
Chloride: 106 mEq/L (ref 96–112)
Creatinine, Ser: 0.57 mg/dL (ref 0.40–1.20)
GFR: 106.58 mL/min (ref >60.00)
Glucose, Bld: 85 mg/dL (ref 70–99)
Potassium: 2.9 mEq/L — ABNORMAL LOW (ref 3.5–5.1)
Sodium: 142 mEq/L (ref 135–145)
Total Bilirubin: 0.6 mg/dL (ref 0.2–1.2)
Total Protein: 7 g/dL (ref 6.0–8.3)

## 2019-08-20 LAB — CBC
HCT: 38.2 % (ref 36.0–46.0)
Hemoglobin: 12.9 g/dL (ref 12.0–15.0)
MCHC: 33.7 g/dL (ref 30.0–36.0)
MCV: 86.2 fl (ref 78.0–100.0)
Platelets: 342 10*3/uL (ref 150.0–400.0)
RBC: 4.43 Mil/uL (ref 3.87–5.11)
RDW: 12.5 % (ref 11.5–15.5)
WBC: 6.8 10*3/uL (ref 4.0–10.5)

## 2019-08-20 NOTE — Progress Notes (Signed)
   Subjective:   Patient ID: [name] [name], female    DOB: 09-15-1955, 64 y.o.   MRN: [id number]  HPI The patient is a 64 YO female coming in for possible UTI (some pain rarely after urinating, having to urinate again right after emptying bladder, denies stomach pain, chronic back pain unclear if changed, denies fevers or chills, denies nausea or vomiting) and hand pain (some new nodules on her hands, with her history she is always concerned about RA, has tested negative for this in the past) and kidney stones (no blood in urine, concerned she may be getting a stone, sometimes pain after urinating, denies blood in urine).   Review of Systems  Constitutional: Negative.   HENT: Negative.   Eyes: Negative.   Respiratory: Negative for cough, chest tightness and shortness of breath.   Cardiovascular: Negative for chest pain, palpitations and leg swelling.  Gastrointestinal: Negative for abdominal distention, abdominal pain, constipation, diarrhea, nausea and vomiting.  Musculoskeletal: Positive for arthralgias and myalgias.  Skin: Negative.   Neurological: Negative.   Psychiatric/Behavioral: Negative.     Objective:  Physical Exam Constitutional:      Appearance: She is well-developed.  HENT:     Head: Normocephalic and atraumatic.  Cardiovascular:     Rate and Rhythm: Normal rate and regular rhythm.  Pulmonary:     Effort: Pulmonary effort is normal. No respiratory distress.     Breath sounds: Normal breath sounds. No wheezing or rales.  Abdominal:     General: Bowel sounds are normal. There is no distension.     Palpations: Abdomen is soft.     Tenderness: There is no abdominal tenderness. There is no rebound.  Musculoskeletal:     Cervical back: Normal range of motion.     Comments: Nodule right and left hand djp  Skin:    General: Skin is warm and dry.  Neurological:     Mental Status: She is alert and oriented to person, place, and time.     Coordination: Coordination  normal.     Vitals:   08/20/19 0949  BP: 136/88  Pulse: 94  Temp: 98.3 F (36.8 C)  TempSrc: Oral  SpO2: 95%  Weight: 149 lb (67.6 kg)  Height: 5\' 1"  (1.549 m)    This visit occurred during the SARS-CoV-2 public health emergency.  Safety protocols were in place, including screening questions prior to the visit, additional usage of staff PPE, and extensive cleaning of exam room while observing appropriate contact time as indicated for disinfecting solutions.   Assessment & Plan:

## 2019-08-20 NOTE — Assessment & Plan Note (Signed)
Previously and feels like one may be starting. She will increase fluids and has been bad about this lately. Checking BMP. U/A done in office without microscopic hematuria.

## 2019-08-20 NOTE — Patient Instructions (Signed)
We will check the labs today. 

## 2019-08-20 NOTE — Assessment & Plan Note (Signed)
Checking ANA and RF.  

## 2019-08-20 NOTE — Assessment & Plan Note (Signed)
U/A done in office and checking urine culture. No antibiotic unless cultures indicates need. Checking BMP.

## 2019-08-24 LAB — ANA,IFA RA DIAG PNL W/RFLX TIT/PATN
Anti Nuclear Antibody (ANA): POSITIVE — AB
Cyclic Citrullin Peptide Ab: <16 UNITS
Rheumatoid fact SerPl-aCnc: <14 IU/mL (ref <14)

## 2019-08-24 LAB — ANTI-NUCLEAR AB-TITER (ANA TITER)
ANA TITER: 1:40 {titer} — ABNORMAL HIGH
ANA Titer 1: 1:40 {titer} — ABNORMAL HIGH

## 2019-08-24 LAB — URINE CULTURE: Result:: NO GROWTH

## 2019-08-25 ENCOUNTER — Other Ambulatory Visit: Payer: Self-pay | Admitting: Internal Medicine

## 2019-08-25 MED ORDER — POTASSIUM CHLORIDE CRYS ER 20 MEQ PO TBCR: 20.0000 meq | EXTENDED_RELEASE_TABLET | Freq: Every day | ORAL | 3 refills | Status: DC

## 2019-11-08 ENCOUNTER — Ambulatory Visit (INDEPENDENT_AMBULATORY_CARE_PROVIDER_SITE_OTHER): Payer: BC Managed Care – PPO | Admitting: Internal Medicine

## 2019-11-08 ENCOUNTER — Other Ambulatory Visit: Payer: Self-pay

## 2019-11-08 ENCOUNTER — Encounter: Payer: Self-pay | Admitting: Internal Medicine

## 2019-11-08 VITALS — BP 142/84 | HR 72 | Temp 98.2°F | Ht 61.0 in | Wt 147.4 lb

## 2019-11-08 DIAGNOSIS — N3941 Urge incontinence: Secondary | ICD-10-CM

## 2019-11-08 DIAGNOSIS — G4700 Insomnia, unspecified: Secondary | ICD-10-CM

## 2019-11-08 MED ORDER — ALPRAZOLAM 0.5 MG PO TABS: 0.5000 mg | ORAL_TABLET | Freq: Two times a day (BID) | ORAL | 1 refills | Status: DC | PRN

## 2019-11-08 MED ORDER — SOLIFENACIN SUCCINATE 10 MG PO TABS: 10.0000 mg | ORAL_TABLET | Freq: Every day | ORAL | 3 refills | Status: DC

## 2019-11-08 NOTE — Progress Notes (Signed)
   Subjective:   Patient ID: [name] [name], female    DOB: 03-Aug-1955, 64 y.o.   MRN: [id number]  HPI The patient is a 64 YO female coming in for concerns about urinary issues. When standing there is leakage at times. She also has problems with making it to the restroom in time. Still gets urge to urinate. Denies burning or pain with urination. Denies fevers or chills or abdominal pain. Took vesicare in the past with good results and would like to try again. Has daughter's wedding upcoming and would like to try for that.   Review of Systems  Constitutional: Negative.   HENT: Negative.   Eyes: Negative.   Respiratory: Negative for cough, chest tightness and shortness of breath.   Cardiovascular: Negative for chest pain, palpitations and leg swelling.  Gastrointestinal: Negative for abdominal distention, abdominal pain, constipation, diarrhea, nausea and vomiting.  Genitourinary: Positive for urgency.       Incontinence  Musculoskeletal: Negative.   Skin: Negative.   Neurological: Negative.   Psychiatric/Behavioral: Negative.     Objective:  Physical Exam Constitutional:      Appearance: She is well-developed.  HENT:     Head: Normocephalic and atraumatic.  Cardiovascular:     Rate and Rhythm: Normal rate and regular rhythm.  Pulmonary:     Effort: Pulmonary effort is normal. No respiratory distress.     Breath sounds: Normal breath sounds. No wheezing or rales.  Abdominal:     General: Bowel sounds are normal. There is no distension.     Palpations: Abdomen is soft.     Tenderness: There is no abdominal tenderness. There is no rebound.  Musculoskeletal:     Cervical back: Normal range of motion.  Skin:    General: Skin is warm and dry.  Neurological:     Mental Status: She is alert and oriented to person, place, and time.     Coordination: Coordination normal.     Vitals:   11/08/19 1015  BP: (!) 142/84  Pulse: 72  Temp: 98.2 F (36.8 C)  SpO2: 99%  Weight: 147 lb  6.4 oz (66.9 kg)  Height: 5\' 1"  (1.549 m)    This visit occurred during the SARS-CoV-2 public health emergency.  Safety protocols were in place, including screening questions prior to the visit, additional usage of staff PPE, and extensive cleaning of exam room while observing appropriate contact time as indicated for disinfecting solutions.   Assessment & Plan:

## 2019-11-08 NOTE — Assessment & Plan Note (Signed)
Temporary increase in xanax to BID for wedding and then will return to 0.5 mg daily.

## 2019-11-08 NOTE — Assessment & Plan Note (Signed)
Rx vesicare 10 mg daily and let us know if working well.

## 2019-11-08 NOTE — Patient Instructions (Signed)
We have sent in vesicare to try

## 2019-11-15 ENCOUNTER — Observation Stay (HOSPITAL_COMMUNITY)
Admission: EM | Admit: 2019-11-15 | Discharge: 2019-11-15 | Disposition: A | Discharge status: Home / Self Care | Payer: BC Managed Care – PPO | Source: Other Acute Inpatient Hospital | Attending: Student | Admitting: Student

## 2019-11-15 ENCOUNTER — Encounter (HOSPITAL_COMMUNITY): Payer: Self-pay | Admitting: Internal Medicine

## 2019-11-15 ENCOUNTER — Other Ambulatory Visit: Payer: Self-pay

## 2019-11-15 ENCOUNTER — Other Ambulatory Visit: Payer: Self-pay | Admitting: Physician Assistant

## 2019-11-15 DIAGNOSIS — E785 Hyperlipidemia, unspecified: Secondary | ICD-10-CM | POA: Diagnosis not present

## 2019-11-15 DIAGNOSIS — F329 Major depressive disorder, single episode, unspecified: Secondary | ICD-10-CM

## 2019-11-15 DIAGNOSIS — Z7951 Long term (current) use of inhaled steroids: Secondary | ICD-10-CM | POA: Insufficient documentation

## 2019-11-15 DIAGNOSIS — W57XXXA Bitten or stung by nonvenomous insect and other nonvenomous arthropods, initial encounter: Secondary | ICD-10-CM | POA: Diagnosis not present

## 2019-11-15 DIAGNOSIS — E876 Hypokalemia: Secondary | ICD-10-CM | POA: Insufficient documentation

## 2019-11-15 DIAGNOSIS — K219 Gastro-esophageal reflux disease without esophagitis: Secondary | ICD-10-CM | POA: Diagnosis not present

## 2019-11-15 DIAGNOSIS — M1990 Unspecified osteoarthritis, unspecified site: Secondary | ICD-10-CM | POA: Diagnosis not present

## 2019-11-15 DIAGNOSIS — R079 Chest pain, unspecified: Principal | ICD-10-CM | POA: Insufficient documentation

## 2019-11-15 DIAGNOSIS — R072 Precordial pain: Secondary | ICD-10-CM

## 2019-11-15 DIAGNOSIS — Z01810 Encounter for preprocedural cardiovascular examination: Secondary | ICD-10-CM

## 2019-11-15 DIAGNOSIS — S40262A Insect bite (nonvenomous) of left shoulder, initial encounter: Secondary | ICD-10-CM | POA: Insufficient documentation

## 2019-11-15 DIAGNOSIS — Z87442 Personal history of urinary calculi: Secondary | ICD-10-CM | POA: Diagnosis not present

## 2019-11-15 DIAGNOSIS — E669 Obesity, unspecified: Secondary | ICD-10-CM | POA: Insufficient documentation

## 2019-11-15 DIAGNOSIS — D649 Anemia, unspecified: Secondary | ICD-10-CM

## 2019-11-15 DIAGNOSIS — Z79899 Other long term (current) drug therapy: Secondary | ICD-10-CM | POA: Diagnosis not present

## 2019-11-15 DIAGNOSIS — J45909 Unspecified asthma, uncomplicated: Secondary | ICD-10-CM | POA: Diagnosis not present

## 2019-11-15 DIAGNOSIS — I10 Essential (primary) hypertension: Secondary | ICD-10-CM | POA: Diagnosis not present

## 2019-11-15 DIAGNOSIS — I160 Hypertensive urgency: Secondary | ICD-10-CM | POA: Insufficient documentation

## 2019-11-15 DIAGNOSIS — F419 Anxiety disorder, unspecified: Secondary | ICD-10-CM | POA: Insufficient documentation

## 2019-11-15 DIAGNOSIS — R0789 Other chest pain: Secondary | ICD-10-CM | POA: Diagnosis present

## 2019-11-15 DIAGNOSIS — K760 Fatty (change of) liver, not elsewhere classified: Secondary | ICD-10-CM | POA: Diagnosis not present

## 2019-11-15 DIAGNOSIS — Z9884 Bariatric surgery status: Secondary | ICD-10-CM | POA: Diagnosis not present

## 2019-11-15 DIAGNOSIS — M797 Fibromyalgia: Secondary | ICD-10-CM | POA: Insufficient documentation

## 2019-11-15 DIAGNOSIS — G4730 Sleep apnea, unspecified: Secondary | ICD-10-CM | POA: Insufficient documentation

## 2019-11-15 DIAGNOSIS — R1111 Vomiting without nausea: Secondary | ICD-10-CM

## 2019-11-15 LAB — CBC WITH DIFFERENTIAL/PLATELET
Abs Immature Granulocytes: 0.01 10*3/uL (ref 0.00–0.07)
Basophils Absolute: 0.1 10*3/uL (ref 0.0–0.1)
Basophils Relative: 1 %
Eosinophils Absolute: 0.2 10*3/uL (ref 0.0–0.5)
Eosinophils Relative: 2 %
HCT: 35 % — ABNORMAL LOW (ref 36.0–46.0)
Hemoglobin: 11.6 g/dL — ABNORMAL LOW (ref 12.0–15.0)
Immature Granulocytes: 0 %
Lymphocytes Relative: 28 %
Lymphs Abs: 2.4 10*3/uL (ref 0.7–4.0)
MCH: 29.2 pg (ref 26.0–34.0)
MCHC: 33.1 g/dL (ref 30.0–36.0)
MCV: 88.2 fL (ref 80.0–100.0)
Monocytes Absolute: 0.8 10*3/uL (ref 0.1–1.0)
Monocytes Relative: 9 %
Neutro Abs: 5.2 10*3/uL (ref 1.7–7.7)
Neutrophils Relative %: 60 %
Platelets: 347 10*3/uL (ref 150–400)
RBC: 3.97 MIL/uL (ref 3.87–5.11)
RDW: 12.8 % (ref 11.5–15.5)
WBC: 8.7 10*3/uL (ref 4.0–10.5)
nRBC: 0 % (ref 0.0–0.2)

## 2019-11-15 LAB — HEPATIC FUNCTION PANEL
ALT: 21 U/L (ref 0–44)
AST: 24 U/L (ref 15–41)
Albumin: 3.4 g/dL — ABNORMAL LOW (ref 3.5–5.0)
Alkaline Phosphatase: 109 U/L (ref 38–126)
Bilirubin, Direct: 0.1 mg/dL (ref 0.0–0.2)
Indirect Bilirubin: 0.3 mg/dL (ref 0.3–0.9)
Total Bilirubin: 0.4 mg/dL (ref 0.3–1.2)
Total Protein: 6 g/dL — ABNORMAL LOW (ref 6.5–8.1)

## 2019-11-15 LAB — BASIC METABOLIC PANEL
Anion gap: 9 (ref 5–15)
Anion gap: 11 (ref 5–15)
BUN: 9 mg/dL (ref 8–23)
BUN: 9 mg/dL (ref 8–23)
CO2: 31 mmol/L (ref 22–32)
CO2: 28 mmol/L (ref 22–32)
Calcium: 8.6 mg/dL — ABNORMAL LOW (ref 8.9–10.3)
Calcium: 9.1 mg/dL (ref 8.9–10.3)
Chloride: 105 mmol/L (ref 98–111)
Chloride: 106 mmol/L (ref 98–111)
Creatinine, Ser: 0.77 mg/dL (ref 0.44–1.00)
Creatinine, Ser: 0.63 mg/dL (ref 0.44–1.00)
GFR calc Af Amer: >60 mL/min (ref >60)
GFR calc Af Amer: >60 mL/min (ref >60)
GFR calc non Af Amer: >60 mL/min (ref >60)
GFR calc non Af Amer: >60 mL/min (ref >60)
Glucose, Bld: 102 mg/dL — ABNORMAL HIGH (ref 70–99)
Glucose, Bld: 115 mg/dL — ABNORMAL HIGH (ref 70–99)
Potassium: 2.9 mmol/L — ABNORMAL LOW (ref 3.5–5.1)
Potassium: 3.6 mmol/L (ref 3.5–5.1)
Sodium: 145 mmol/L (ref 135–145)
Sodium: 145 mmol/L (ref 135–145)

## 2019-11-15 LAB — TROPONIN I (HIGH SENSITIVITY)
Troponin I (High Sensitivity): 6 ng/L (ref <18)
Troponin I (High Sensitivity): 7 ng/L (ref <18)

## 2019-11-15 LAB — MAGNESIUM: Magnesium: 2.1 mg/dL (ref 1.7–2.4)

## 2019-11-15 LAB — HIV ANTIBODY (ROUTINE TESTING W REFLEX): HIV Screen 4th Generation wRfx: NONREACTIVE

## 2019-11-15 MED ORDER — METOPROLOL TARTRATE 50 MG PO TABS
ORAL_TABLET | ORAL | 0 refills | Status: DC
Start: 2019-11-15 — End: 2020-11-20

## 2019-11-15 MED ORDER — DARIFENACIN HYDROBROMIDE ER 7.5 MG PO TB24
7.5000 mg | ORAL_TABLET | Freq: Every day | ORAL | Status: DC
  Administered 2019-11-15: 7.5 mg via ORAL
  Filled 2019-11-15: qty 1

## 2019-11-15 MED ORDER — ACETAMINOPHEN 500 MG PO TABS
1000.0000 mg | ORAL_TABLET | Freq: Three times a day (TID) | ORAL | Status: DC | PRN
  Administered 2019-11-15: 1000 mg via ORAL
  Filled 2019-11-15: qty 2

## 2019-11-15 MED ORDER — POTASSIUM CHLORIDE CRYS ER 20 MEQ PO TBCR: 20.0000 meq | EXTENDED_RELEASE_TABLET | Freq: Every day | ORAL | Status: DC

## 2019-11-15 MED ORDER — ALPRAZOLAM 0.5 MG PO TABS
0.5000 mg | ORAL_TABLET | Freq: Two times a day (BID) | ORAL | Status: DC | PRN
  Administered 2019-11-15: 0.5 mg via ORAL
  Filled 2019-11-15: qty 1

## 2019-11-15 MED ORDER — HYDRALAZINE HCL 25 MG PO TABS
25.0000 mg | ORAL_TABLET | Freq: Once | ORAL | Status: AC
  Administered 2019-11-15: 25 mg via ORAL
  Filled 2019-11-15: qty 1

## 2019-11-15 MED ORDER — AMLODIPINE BESYLATE 5 MG PO TABS
5.0000 mg | ORAL_TABLET | Freq: Every day | ORAL | Status: DC
  Administered 2019-11-15: 5 mg via ORAL
  Filled 2019-11-15: qty 1

## 2019-11-15 MED ORDER — POTASSIUM CHLORIDE CRYS ER 20 MEQ PO TBCR
40.0000 meq | EXTENDED_RELEASE_TABLET | ORAL | Status: AC
  Administered 2019-11-15 (×3): 40 meq via ORAL
  Filled 2019-11-15 (×3): qty 2

## 2019-11-15 MED ORDER — NITROGLYCERIN 0.4 MG SL SUBL: 0.4000 mg | SUBLINGUAL_TABLET | SUBLINGUAL | Status: DC | PRN

## 2019-11-15 MED ORDER — AMLODIPINE BESYLATE 5 MG PO TABS: 5.0000 mg | ORAL_TABLET | Freq: Every day | ORAL | 1 refills | Status: DC

## 2019-11-15 MED ORDER — AMLODIPINE BESYLATE 10 MG PO TABS
10.0000 mg | ORAL_TABLET | Freq: Every day | ORAL | 0 refills | Status: DC
Start: 2019-11-15 — End: 2019-11-18

## 2019-11-15 MED ORDER — ALBUTEROL SULFATE (2.5 MG/3ML) 0.083% IN NEBU: 2.5000 mg | INHALATION_SOLUTION | Freq: Four times a day (QID) | RESPIRATORY_TRACT | Status: DC | PRN

## 2019-11-15 MED ORDER — ENOXAPARIN SODIUM 40 MG/0.4ML ~~LOC~~ SOLN: 40.0000 mg | SUBCUTANEOUS | Status: DC

## 2019-11-15 MED ORDER — ASPIRIN EC 81 MG PO TBEC
81.0000 mg | DELAYED_RELEASE_TABLET | Freq: Every day | ORAL | Status: DC
  Administered 2019-11-15: 81 mg via ORAL
  Filled 2019-11-15: qty 1

## 2019-11-15 MED ORDER — VENLAFAXINE HCL ER 150 MG PO CP24
150.0000 mg | ORAL_CAPSULE | Freq: Every day | ORAL | Status: DC
  Administered 2019-11-15: 150 mg via ORAL
  Filled 2019-11-15: qty 1

## 2019-11-15 MED ORDER — ONDANSETRON HCL 4 MG/2ML IJ SOLN: 4.0000 mg | Freq: Four times a day (QID) | INTRAMUSCULAR | Status: DC | PRN

## 2019-11-15 MED ORDER — POTASSIUM CHLORIDE CRYS ER 20 MEQ PO TBCR: 20.0000 meq | EXTENDED_RELEASE_TABLET | Freq: Every day | ORAL | 1 refills | Status: DC

## 2019-11-15 MED ORDER — ONDANSETRON HCL 4 MG PO TABS
4.0000 mg | ORAL_TABLET | Freq: Four times a day (QID) | ORAL | Status: DC | PRN
  Administered 2019-11-15: 4 mg via ORAL
  Filled 2019-11-15: qty 1

## 2019-11-15 NOTE — Progress Notes (Signed)
K 3.6, BP 170s/90s, Alanda Slim, MD made aware. MD ok to DC pt home with Norvas 10mg .

## 2019-11-15 NOTE — Consult Note (Addendum)
Cardiology Consultation:   Patient ID: [name] [name] MRN: [id number]; DOB: Jul 12, 1955  Admit date: 11/15/2019 Date of Consult: 11/15/2019  Primary Care Provider: Myrlene Broker, MD Primary Cardiologist: New Primary Electrophysiologist:  None    Patient Profile:   [name] [name] is a 64 y.o. female with a hx of Ehlers-Danlos syndrome type III, fatty liver, fibromyalgia, hypertension (resolved upon weight loss), hyperlipidemia, prediabetes and history of obesity s/p bariatric surgery in 2017 who is being seen today for the evaluation of chest pain radiating to the jaw at the request of Dr. Alanda Slim.  History of Present Illness:   [name] [name] is a pleasant 64 year old female with past medical history of Ehlers-Danlos syndrome type III, fatty liver, fibromyalgia, hypertension (resolved upon weight loss), hyperlipidemia, prediabetes and history of obesity s/p bariatric surgery in 2017.  According to the patient, she saw a cardiologist over 15 years ago for palpitation.  She reportedly underwent echocardiogram and stress test at the time which were normal.  She has not seen cardiologist since then.  The previous cardiologist she saw has since retired and she cannot recall the name.  In 2017, she performed another stress test at [facility] which was also normal (per patient report, however I am unable to locate this stress test under CareEverywhere).  This was done as part of preoperative evaluation prior to bariatric surgery.  Since then, she has lost a lot of weight and no longer takes any medications for high blood pressure and high cholesterol.  Blood sugar has also normalized as well after weight loss.  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  She was seen by her PCP in March.  Lab work at the time was positive for hypokalemia of 2.9, positive ANA with ANA titer 1:40.   The result was reviewed by Dr. Hillard Danker who felt her ANA test was only mildly positive and likely not related to her rheumatoid arthritis.  Specific tests for rheumatoid arthritis were negative.  She was started on potassium supplement to help with hypokalemia.  In the past few days, she went to the urgent care to have a tick pulled off of her.  She was given a prescription for doxycycline.  She did not wish to take the doxycycline on empty stomach and ate some chicken for lunch yesterday.  Around 1:30 PM, she felt like she choked on a piece of chicken meat.  She started having significant nausea and vomiting afterward.  At the same time, she was having waves of chest pain radiating to the jaw.  She says it feels like her chest was tightening until episode of vomiting which immediately offers some relief for her chest pain.  This occurred over and over again.  She had acid reflux in the past however never had jaw pain with the acid reflux.  She eventually sought medical attention at [facility].  On arrival, her potassium was 2.4.  Sodium, creatinine and lipase level were normal.  AST borderline elevated to 41, ALT normal.  White blood cell count borderline elevated to 11.3, platelet count 112, hemoglobin normal.  D-dimer was high at 1093.  On arrival to [facility], her blood pressure is severely high at 216/102.  Blood pressure eventually came down to the 130s to 150s range.  Pulse and temperature were normal.  Troponin was also normal.  Lyme disease  IgM and IgG along with IgM antibody were obtained however currently pending.  proBNP normal. Chest x-ray without any acute finding.  Given positive D-dimer and the nausea and vomiting, CTA of the chest and abdomen pelvis were obtained.  CTA of the chest was negative for PE.  While in the [place] ED, she received 20 mEq of potassium chloride and sublingual nitroglycerin.  Since she never received the oral antibiotic prior to arrival, she was  given a dose of IV doxycycline.  Nitroglycerin initially improved her chest pain, it eventually went away with IV Phenergan 25 mg.  The case was discussed it was cardiologist on-call Dr. Servando Salina at [facility] and the patient was eventually transferred to [facility].  At the advice of our [facility] overnight fellow, patient was admitted under hospitalist service.  Cardiology was consulted for chest pain.   Past Medical History:  Diagnosis Date  . Allergy    seasonal  . Anxiety   . Arthritis   . Asthma   . Complication of anesthesia    headache after neck surgery  . Ehlers-Danlos syndrome type III   . Fatty liver   . Fibromyalgia   . GERD (gastroesophageal reflux disease)    history of  . Heart murmur   . History of blood in urine   . History of bronchitis   . History of cholelithiasis   . History of kidney stones   . Hyperlipemia   . Hyperparathyroidism (HCC)   . Hypertension    no medication needed since bariatric surgery  . Insomnia   . Leg pain   . Low back pain   . Obese    history of  . Pneumonia    history of   . Pre-diabetes    no since weight loss  . Sleep apnea    improved since weight loss    Past Surgical History:  Procedure Laterality Date  . achilles tendon tibial tendon fusion     4 surgeries  . CERVICAL DISCECTOMY     2010  . CESAREAN SECTION    . CHOLECYSTECTOMY    . COLONOSCOPY    . CYSTOSCOPY WITH RETROGRADE PYELOGRAM, URETEROSCOPY AND STENT PLACEMENT Right 08/01/2017   Procedure: CYSTOSCOPY WITH RETROGRADE PYELOGRAM, URETEROSCOPY AND STENT PLACEMENT;  Surgeon: Sebastian Ache, MD;  [facility];  Service: Urology;  Laterality: Right;  . HOLMIUM LASER APPLICATION Right 08/01/2017   Procedure: HOLMIUM LASER APPLICATION;  Surgeon: Sebastian Ache, MD;  Location: [facility];  Service: Urology;  Laterality: Right;  . JOINT REPLACEMENT Right    partial joint replacement knee  . LAPAROSCOPIC GASTRIC  RESTRICTIVE DUODENAL PROCEDURE (DUODENAL SWITCH)     2017  . lower back     2008  . spinal injections    . SPINE SURGERY     L4-L5, C6-C7      Home Medications:  Prior to Admission medications   Medication Sig Start Date End Date Taking? Authorizing Provider  albuterol (PROVENTIL HFA;VENTOLIN HFA) 108 (90 Base) MCG/ACT inhaler Inhale 2 puffs into the lungs every 6 (six) hours as needed for wheezing. 09/16/18   Myrlene Broker, MD  ALPRAZolam Prudy Feeler) 0.5 MG tablet Take 1 tablet (0.5 mg total) by mouth 2 (two) times daily as needed. for anxiety 11/08/19   Myrlene Broker, MD  fluticasone [facility]) 50 MCG/ACT nasal spray fluticasone propionate 50 mcg/actuation nasal spray,suspension    [provider]  Fluticasone-Salmeterol (ADVAIR) 100-50 MCG/DOSE AEPB Inhale 1 puff into  the lungs 2 (two) times daily. 11/30/14   Myrlene Brokerrawford, Elizabeth A, MD  loratadine (CLARITIN) 10 MG tablet Take 10 mg by mouth daily.    [provider]  Multiple Vitamins-Minerals (BARIATRIC MULTIVITAMINS/IRON PO) Take by mouth.    [provider]  potassium chloride SA (KLOR-CON) 20 MEQ tablet Take 1 tablet (20 mEq total) by mouth daily. 08/25/19   Myrlene Brokerrawford, Elizabeth A, MD  solifenacin (VESICARE) 10 MG tablet Take 1 tablet (10 mg total) by mouth daily. 11/08/19   Myrlene Brokerrawford, Elizabeth A, MD  venlafaxine XR (EFFEXOR-XR) 150 MG 24 hr capsule TAKE 1 CAPSULE BY MOUTH DAILY. 06/07/19   Myrlene Brokerrawford, Elizabeth A, MD    Inpatient Medications: Scheduled Meds: . aspirin EC  81 mg Oral Daily  . darifenacin  7.5 mg Oral Daily  . enoxaparin (LOVENOX) injection  40 mg Subcutaneous Q24H  . potassium chloride SA  20 mEq Oral Daily  . venlafaxine XR  150 mg Oral Daily   Continuous Infusions:  PRN Meds: albuterol, ALPRAZolam, nitroGLYCERIN, ondansetron **OR** ondansetron (ZOFRAN) IV  Allergies:   No Known Allergies  Social History:   Social History   Socioeconomic History  . Marital status: Married      Spouse name: Not on file  . Number of children: Not on file  . Years of education: Not on file  . Highest education level: Not on file  Occupational History  . Not on file  Tobacco Use  . Smoking status: Never Smoker  . Smokeless tobacco: Never Used  Substance and Sexual Activity  . Alcohol use: Yes    Alcohol/week: 0.0 standard drinks    Comment: rare  . Drug use: No  . Sexual activity: Not on file  Other Topics Concern  . Not on file  Social History Narrative   Lives with husband in a 2 story home.  Has 1 daughter.     On disability in 2009.  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      Social Determinants of Health   Financial Resource Strain:   . Difficulty of Paying Living Expenses:   Food Insecurity:   . Worried About Programme researcher, broadcasting/film/videounning Out of Food in the Last Year:   . Baristaan Out of Food in the Last Year:   Transportation Needs:   . Freight forwarderLack of Transportation (Medical):   Marland Kitchen. Lack of Transportation (Non-Medical):   Physical Activity:   . Days of Exercise per Week:   . Minutes of Exercise per Session:   Stress:   . Feeling of Stress :   Social Connections:   . Frequency of Communication with Friends and Family:   . Frequency of Social Gatherings with Friends and Family:   . Attends Religious Services:   . Active Member of Clubs or Organizations:   . Attends BankerClub or Organization Meetings:   Marland Kitchen. Marital Status:   Intimate Partner Violence:   . Fear of Current or Ex-Partner:   . Emotionally Abused:   Marland Kitchen. Physically Abused:   . Sexually Abused:     Family History:    Family History  Problem Relation Age of Onset  . Parkinson's disease Mother        Deceased, 5848  . Heart disease Father        Deceased, 2180  . Pancreatic cancer Sister        Survivor  . Healthy Daughter   . Colon cancer Neg Hx   . Colon polyps Neg Hx   . Esophageal cancer  Neg Hx   . Rectal cancer Neg Hx   . Stomach cancer Neg Hx      ROS:  Please see the history of present illness.   All other  ROS reviewed and negative.     Physical Exam/Data:   Vitals:   11/15/19 0314  BP: (!) 170/89  Pulse: 61  Resp: 17  Temp: 98.9 F (37.2 C)  TempSrc: Oral  SpO2: 98%  Weight: 66.6 kg  Height: 5\' 1"  (1.549 m)    Intake/Output Summary (Last 24 hours) at 11/15/2019 0756 Last data filed at 11/15/2019 0317 Gross per 24 hour  Intake 120 ml  Output --  Net 120 ml   Last 3 Weights 11/15/2019 11/08/2019 08/20/2019  Weight (lbs) 146 lb 12.8 oz 147 lb 6.4 oz 149 lb  Weight (kg) 66.588 kg 66.86 kg 67.586 kg     Body mass index is 27.74 kg/m.  General:  Well nourished, well developed, in no acute distress HEENT: normal Lymph: no adenopathy Neck: no JVD Endocrine:  No thryomegaly Vascular: No carotid bruits; FA pulses 2+ bilaterally without bruits  Cardiac:  normal S1, S2; RRR; no murmur  Lungs:  clear to auscultation bilaterally, no wheezing, rhonchi or rales  Abd: soft, nontender, no hepatomegaly  Ext: no edema Musculoskeletal:  No deformities, BUE and BLE strength normal and equal Skin: warm and dry  Neuro:  CNs 2-12 intact, no focal abnormalities noted Psych:  Normal affect   EKG:  The EKG was personally reviewed and demonstrates:  NSR with questionable Q wave in the inferior leads and poor R wave progression in the anterior leads Telemetry:  Telemetry was personally reviewed and demonstrates:  NSR without ventricular ectopy  Relevant CV Studies:  N/A  Laboratory Data:  High Sensitivity Troponin:   Recent Labs  Lab 11/15/19 0510  TROPONINIHS 6     Chemistry Recent Labs  Lab 11/15/19 0510  NA 145  K 2.9*  CL 105  CO2 31  GLUCOSE 102*  BUN 9  CREATININE 0.77  CALCIUM 8.6*  GFRNONAA >60  GFRAA >60  ANIONGAP 9    Recent Labs  Lab 11/15/19 0510  PROT 6.0*  ALBUMIN 3.4*  AST 24  ALT 21  ALKPHOS 109  BILITOT 0.4   Hematology Recent Labs  Lab 11/15/19 0510  WBC 8.7  RBC 3.97  HGB 11.6*  HCT 35.0*  MCV 88.2  MCH 29.2  MCHC 33.1  RDW 12.8  PLT 347    BNPNo results for input(s): BNP, PROBNP in the last 168 hours.  DDimer No results for input(s): DDIMER in the last 168 hours.   Radiology/Studies:  No results found.     HEAR Score (for undifferentiated chest pain):       Assessment and Plan:   1. Chest pain:   -She describes a dull chest pain radiating to the jaw.  The symptom occurred in waves during 5 hours of onset of nausea and vomiting.  -Preceding each vomiting episode, she would feel that her chest was tightening, chest pain relieved after vomiting episode.  Symptom initially relieved with sublingual nitroglycerin, however later completely resolved with IV Phenergan.  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  -Her symptom could be related to esophageal spasm in the setting of nausea and vomiting.  Troponin was negative both [facility] and here.  -We will discuss with MD, she may be a candidate for outpatient coronary  CT versus Myoview.  2. Nausea and vomiting: Symptom lasted about 5 hours yesterday before finally resolved after IV Phenergan.  She says initial symptoms started after she was trying to eat lunch before started on her oral doxycycline after recent tick bite.  She says she may have a piece of chicken stuck in her throat before symptom onset.  3. Recent tick bite: Tick was removed by urgent care.  She was given a prescription for oral doxycycline which she was never started.  She plan to start on the doxycycline yesterday however she ended up being admitted due to nausea and vomiting after food.  She was given IV doxycycline at [facility].  Lyme disease IgG, IgM, and IgM antibody obtained at [facility] and were pending at the time of the transfer.  4. History of bariatric surgery with significant weight loss  5. Hypertensive urgency: Arrived with systolic blood pressure over 200, this was in the setting of chest pain, nausea and  vomiting.  Blood pressure has since came down itself without any treatment.  6. Hypokalemia: She had hypokalemia in March 2021 with potassium of 2.9 at the time.  She was started on a course of potassium supplement by her PCP, however she arrived at this time with potassium of 2.4 likely exacerbated by vomiting episodes. Discussed with Dr. Jacques Navy, she will need to double up on her KCl supplement and take BID for 2 days before resuming previous dose.   7. Elevated D-dimer: CTA of the chest was negative.      For questions or updates, please contact [facility] Please consult [email or web address] for contact info under     Signed, Azalee Course, [place]  11/15/2019 7:56 AM  Patient seen and examined with Azalee Course PA.  Agree as above, with the following exceptions and changes as noted below. [name] [name] is a 64 yo female who presents with atypical chest pain that seems most consistent with esophageal spasm. Gen: NAD, CV: RRR, no murmurs, Lungs: clear, Abd: soft, Extrem: Warm, well perfused, no edema, Neuro/Psych: alert and oriented x 3, normal mood and affect. All available labs, radiology testing, previous records reviewed.  I spoke to the patient at length about her symptoms.  She is chest pain-free currently.  CT PE from [facility] notes mild coronary artery calcifications.  Patient I have participated in shared decision making and determined that an outpatient ischemic evaluation would be most appropriate given that her current symptoms seem to correlate with GI/noncardiac source of chest pain.  That being said she has a suggestion of Q waves in her inferior leads on outside hospital ECG, and with risk factors of hypertension, hyperlipidemia, prediabetes, prior obesity status post bariatric surgery, ischemic evaluation is warranted.  I will see the patient in follow-up and we will obtain an outpatient CT coronary angiogram.  I discussed this with the patient.  Parke Poisson, MD 11/15/19 9:45 AM

## 2019-11-15 NOTE — Progress Notes (Signed)
Pt completed both doses of Pfizer COVID vaccine

## 2019-11-15 NOTE — Discharge Instructions (Signed)

## 2019-11-15 NOTE — Progress Notes (Signed)
Provider on call notified EKG has been transmitted

## 2019-11-15 NOTE — H&P (Signed)
History and Physical    [name] [name] XTG:[id number] DOB: 1956/03/14 DOA: 11/15/2019  PCP: Hoyt Koch, MD  Patient coming from: Home.  Patient was transferred from [place].  Chief Complaint: Chest pain.  HPI: [name] [name] is a 64 y.o. female with history of previous bariatric surgery, anxiety, urge incontinence had presented to the ER at [facility] yesterday with complaints of having chest pain.  Patient states after having lunch yesterday around noontime patient started having multiple episodes of vomiting followed by chest pain radiating to the jaw.  Denies any blood in the vomitus denies any diarrhea or abdominal pain has had previous history of cholecystectomy.  Denies any shortness of breath or fever productive cough.  Chest pain was coming in episodes.  About 48 hours ago patient also had a tick bite on the left upper shoulder which patient states may have stayed around 24 hours.  ED Course: In the ER at [facility] patient's EKG showed nonspecific changes troponins were negative CT angiogram of the chest was negative for PE CT abdomen pelvis was unremarkable.  Patient's chest pain resolved with sublingual nitroglycerin but started recurring again with some nausea at the time patient was given Phenergan following which complete resolution of symptoms.  Given the concerns of reversible EKG changes on-call cardiologist to [facility] was contacted and recommended transfer to [facility].  At the time of my exam patient is chest pain-free.  Labs are showing mild leukocytosis and hypokalemia.  On my exam patient's abdomen appears benign.  Review of Systems: As per HPI, rest all negative.   Past Medical History:  Diagnosis Date  . Allergy    seasonal  . Anxiety   . Arthritis   . Asthma   . Complication of anesthesia    headache after neck surgery  . Ehlers-Danlos syndrome type III   . Fatty liver   . Fibromyalgia   . GERD (gastroesophageal reflux disease)    history of  . Heart murmur   . History of blood in urine   . History of bronchitis   . History of cholelithiasis   . History of kidney stones   . Hyperlipemia   . Hyperparathyroidism ([place])   . Hypertension    no medication needed since bariatric surgery  . Insomnia   . Leg pain   . Low back pain   . Obese    history of  . Pneumonia    history of   . Pre-diabetes    no since weight loss  . Sleep apnea    improved since weight loss    Past Surgical History:  Procedure Laterality Date  . achilles tendon tibial tendon fusion     4 surgeries  . CERVICAL DISCECTOMY     2010  . CESAREAN SECTION    . CHOLECYSTECTOMY    . COLONOSCOPY    . CYSTOSCOPY WITH RETROGRADE PYELOGRAM, URETEROSCOPY AND STENT PLACEMENT Right 08/01/2017   Procedure: CYSTOSCOPY WITH RETROGRADE PYELOGRAM, URETEROSCOPY AND STENT PLACEMENT;  Surgeon: Alexis Frock, MD;  Location: [facility];  Service: Urology;  Laterality: Right;  . HOLMIUM LASER APPLICATION Right 2/70/3500   Procedure: HOLMIUM LASER APPLICATION;  Surgeon: Alexis Frock, MD;  Location: [facility];  Service: Urology;  Laterality: Right;  . JOINT REPLACEMENT Right    partial joint replacement knee  . LAPAROSCOPIC GASTRIC RESTRICTIVE DUODENAL PROCEDURE (DUODENAL SWITCH)     2017  . lower back     2008  . spinal  injections    . SPINE SURGERY     L4-L5, C6-C7      reports that she has never smoked. She has never used smokeless tobacco. She reports current alcohol use. She reports that she does not use drugs.  No Known Allergies  Family History  Problem Relation Age of Onset  . Parkinson's disease Mother        Deceased, 34  . Heart disease Father        Deceased, 70  . Pancreatic cancer Sister        Survivor  . Healthy Daughter   . Colon cancer Neg Hx   . Colon polyps Neg Hx   . Esophageal cancer Neg Hx   . Rectal cancer Neg Hx   . Stomach cancer Neg Hx     Prior to Admission medications     Medication Sig Start Date End Date Taking? Authorizing Provider  albuterol (PROVENTIL HFA;VENTOLIN HFA) 108 (90 Base) MCG/ACT inhaler Inhale 2 puffs into the lungs every 6 (six) hours as needed for wheezing. 09/16/18   Myrlene Broker, MD  ALPRAZolam Prudy Feeler) 0.5 MG tablet Take 1 tablet (0.5 mg total) by mouth 2 (two) times daily as needed. for anxiety 11/08/19   Myrlene Broker, MD  fluticasone [facility]) 50 MCG/ACT nasal spray fluticasone propionate 50 mcg/actuation nasal spray,suspension    [provider]  Fluticasone-Salmeterol (ADVAIR) 100-50 MCG/DOSE AEPB Inhale 1 puff into the lungs 2 (two) times daily. 11/30/14   Myrlene Broker, MD  loratadine (CLARITIN) 10 MG tablet Take 10 mg by mouth daily.    [provider]  Multiple Vitamins-Minerals (BARIATRIC MULTIVITAMINS/IRON PO) Take by mouth.    [provider]  potassium chloride SA (KLOR-CON) 20 MEQ tablet Take 1 tablet (20 mEq total) by mouth daily. 08/25/19   Myrlene Broker, MD  solifenacin (VESICARE) 10 MG tablet Take 1 tablet (10 mg total) by mouth daily. 11/08/19   Myrlene Broker, MD  venlafaxine XR (EFFEXOR-XR) 150 MG 24 hr capsule TAKE 1 CAPSULE BY MOUTH DAILY. 06/07/19   Myrlene Broker, MD    Physical Exam: Constitutional: Moderately built and nourished. Vitals:   11/15/19 0314  BP: (!) 170/89  Pulse: 61  Temp: 98.9 F (37.2 C)  TempSrc: Oral  SpO2: 98%   Eyes: Anicteric no pallor. ENMT: No discharge from the ears eyes nose or mouth. Neck: No mass felt.  No neck rigidity. Respiratory: No rhonchi or crepitations. Cardiovascular: S1-S2 heard. Abdomen: Soft nontender bowel sounds present. Musculoskeletal: No edema. Skin: No rash. Neurologic: Alert awake oriented to time place and person.  Moves all extremities. Psychiatric: Appears normal.  Normal affect.   Labs on Admission: I have personally reviewed following labs and imaging studies  CBC: No results for  input(s): WBC, NEUTROABS, HGB, HCT, MCV, PLT in the last 168 hours. Basic Metabolic Panel: No results for input(s): NA, K, CL, CO2, GLUCOSE, BUN, CREATININE, CALCIUM, MG, PHOS in the last 168 hours. GFR: CrCl cannot be calculated (Patient's most recent lab result is older than the maximum 21 days allowed.). Liver Function Tests: No results for input(s): AST, ALT, ALKPHOS, BILITOT, PROT, ALBUMIN in the last 168 hours. No results for input(s): LIPASE, AMYLASE in the last 168 hours. No results for input(s): AMMONIA in the last 168 hours. Coagulation Profile: No results for input(s): INR, PROTIME in the last 168 hours. Cardiac Enzymes: No results for input(s): CKTOTAL, CKMB, CKMBINDEX, TROPONINI in the last 168 hours. BNP (last 3 results)  No results for input(s): PROBNP in the last 8760 hours. HbA1C: No results for input(s): HGBA1C in the last 72 hours. CBG: No results for input(s): GLUCAP in the last 168 hours. Lipid Profile: No results for input(s): CHOL, HDL, LDLCALC, TRIG, CHOLHDL, LDLDIRECT in the last 72 hours. Thyroid Function Tests: No results for input(s): TSH, T4TOTAL, FREET4, T3FREE, THYROIDAB in the last 72 hours. Anemia Panel: No results for input(s): VITAMINB12, FOLATE, FERRITIN, TIBC, IRON, RETICCTPCT in the last 72 hours. Urine analysis:    Component Value Date/Time   BILIRUBINUR 1+ 08/20/2019 1006   PROTEINUR Positive (A) 08/20/2019 1006   UROBILINOGEN negative (A) 08/20/2019 1006   NITRITE neg 08/20/2019 1006   LEUKOCYTESUR Negative 08/20/2019 1006   Sepsis Labs: @LABRCNTIP (procalcitonin:4,lacticidven:4) )No results found for this or any previous visit (from the past 240 hour(s)).   Radiological Exams on Admission: No results found.  EKG: Independently reviewed.  Normal sinus rhythm with poor R wave progression with nonspecific changes this was a EKG done at [place].  Assessment/Plan Principal Problem:   Chest pain    1. Chest pain -pain improved with  sublingual nitroglycerin and completely resolved with IV Phenergan.  Will cycle cardiac markers keep n.p.o. and consult cardiology.  CT angiogram of the chest was negative for PE and CT abdomen pelvis also negative for any acute. 2. Elevated blood pressure with previous history of hypertension presently not on any antihypertensives.  We will keep patient as needed IV hydralazine.  Follow blood pressure trends. 3. History of bariatric surgery presented with nausea vomiting CT abdomen pelvis did not show any acute good LFTs were normal. 4. Hypokalemia likely from vomiting replace recheck. 5. Recent tick bite.  No rash seen on the body no joint pains.  Tick bite was seen on the left upper back around the shoulder patient states may have stayed on for 24 hours.  Lyme titers were sent at [facility]. 6. History of anxiety on Xanax and Effexor. 7. History of urge incontinence.  Since patient had a constellation of symptoms concerning for unstable angina will need close monitoring and inpatient status.   Covid test is pending.   DVT prophylaxis: Lovenox. Code Status: Full code. Family Communication: Discussed with patient. Disposition Plan: Home. Consults called: Cardiology. Admission status: Observation.   [facility] MD [facility] Pager [phone].  If 7PM-7AM, please contact night-coverage [email or web address] Password [facility]  11/15/2019, 4:38 AM

## 2019-11-15 NOTE — Progress Notes (Signed)
Pt complaining of headache - BP elevated in 170s-180s despite new dose of norvasc earlier today. MD paged and made aware - order received for tylenol and given per order. No new orders for BP medications. Will continue to monitor closely.

## 2019-11-15 NOTE — Plan of Care (Signed)
  Problem: Activity: Goal: Risk for activity intolerance will decrease Outcome: Progressing   Problem: Pain Managment: Goal: General experience of comfort will improve Outcome: Progressing   Problem: Safety: Goal: Ability to remain free from injury will improve Outcome: Progressing   

## 2019-11-15 NOTE — Discharge Summary (Signed)
Physician Discharge Summary  [name] [name] IOX:[id number] DOB: [birth year] DOA: 11/15/2019  PCP: Myrlene Broker, MD  Admit date: 11/15/2019 Discharge date: 11/15/2019  Admitted From: Home Disposition: Home  Recommendations for Outpatient Follow-up:  1. Follow ups as below. 2. Please obtain CBC/BMP/Mag at follow up 3. Please follow up on the following pending results: None  Home Health: None Equipment/Devices: None  Discharge Condition: Stable CODE STATUS: Full code  Follow-up Information    Parke Poisson, MD Follow up.   Specialties: Cardiology, Radiology Why: our scheduler will contact you to arrange outpatient coronary CT and followup with Dr. Jacques Navy.  Contact information: [address] [place] [address] [phone]        Myrlene Broker, MD. Schedule an appointment as soon as possible for a visit in 1 week(s).   Specialty: Internal Medicine Contact information: [address] [place] [address] [phone]            Hospital Course: 64 year old female with history of bariatric surgery, anxiety and incontinence presented to [facility] after multiple emesis followed by episodic chest pain radiating to her jaws.  Reportedly had tick bite 48 hours prior to presentation.  She think that the current state on for about 24 hours but not engorged.  She has not had skin rash, fever or chills.  Per admitting provider, EKG with nonspecific changes.  Trops negative.  CTA chest/abdomen and pelvis without acute finding. Patient's chest pain resolved with sublingual nitroglycerin but started recurring again with some nausea at the time.  Patient was given Phenergan following which complete resolution of symptoms.  Given the concerns of reversible EKG changes on-call cardiologist to [facility] was contacted and recommended transfer to [facility].  On arrival to [facility], patient was chest pain-free and asymptomatic.  Blood  pressure elevated to 170s/80s.  K2.9.  Hgb 11.6.  Otherwise, CMP and CBC with differential without significant finding.  High-sensitivity troponin 6>7.  EKG sinus bradycardia without acute ischemic finding.   Patient has not had further cardiopulmonary or GI symptoms.  Hypokalemia resolved.  Evaluated by cardiology and cleared for discharge with a plan for outpatient coronary CT.   Of note, BP remained elevated in 170s/80s.  Started on low-dose amlodipine and discharged on amlodipine 10 mg daily.  She was given hydralazine 25 mg once prior to discharge.  See individual problem list below for more hospital course  Discharge Diagnoses:  Atypical chest pain-resolved.  Serial troponin and EKG negative.  CTA chest negative for PE or acute finding. -Cardiology to arrange outpatient follow-up -Started on amlodipine for elevated blood pressure  Emesis: Resolved.  Unclear etiology.  CT abdomen and pelvis negative for acute finding.  Hypokalemia: Resolved.  Likely due to emesis. -Renew home potassium supplements  Tick bite -Follow labs from wound of hospital  Normocytic anemia: H&H stable. -Outpatient follow-up  Anxiety and depression -Continue home meds  History of bariatric surgery -Outpatient follow-up  Elevated blood pressure without diagnosis of hypertension -Discharged on amlodipine 10 mg daily                Discharge Exam: Vitals:   11/15/19 1121 11/15/19 1122  BP: (!) 176/88 (!) 176/88  Pulse:    Resp:    Temp:    SpO2:      GENERAL: No apparent distress.  Nontoxic. HEENT: MMM.  Vision and hearing grossly intact.  NECK: Supple.  No apparent JVD.  RESP:  No IWOB.  Fair aeration bilaterally.  CVS:  RRR. Heart sounds normal.  ABD/GI/GU: Bowel sounds present. Soft. Non tender.  MSK/EXT:  Moves extremities. No apparent deformity. No edema.  SKIN: no apparent skin lesion or wound NEURO: Awake, alert and oriented appropriately.  No apparent focal neuro deficit.  PSYCH: Calm. Normal affect.   Discharge Instructions  Discharge Instructions    Call MD for:  difficulty breathing, headache or visual disturbances   Complete by: As directed    Call MD for:  extreme fatigue   Complete by: As directed    Call MD for:  persistant dizziness or light-headedness   Complete by: As directed    Call MD for:  persistant nausea and vomiting   Complete by: As directed    Call MD for:  severe uncontrolled pain   Complete by: As directed    Diet - low sodium heart healthy   Complete by: As directed    Discharge instructions   Complete by: As directed    It has been a pleasure taking care of you! You were hospitalized with vomiting and chest pain.  Still unclear why he has no symptoms but they have resolved.  We have not come across concerning findings other than low potassium based on the test is we have done so far.  We have replenished your potassium, and discharging you on oral potassium to take at home.  Cardiology will arrange outpatient follow-up to perform further testing and ensure this is not related to your heart. Carefully review your new medication list and the instructions on how to take them as there could be some changes during this hospitalization.  Contact your primary care doctor for refills on your medications.   Take care,   Increase activity slowly   Complete by: As directed      Allergies as of 11/15/2019   No Known Allergies     Medication List    TAKE these medications   albuterol 108 (90 Base) MCG/ACT inhaler Commonly known as: VENTOLIN HFA Inhale 2 puffs into the lungs every 6 (six) hours as needed for wheezing.   ALPRAZolam 0.5 MG tablet Commonly known as: XANAX Take 1 tablet (0.5 mg total) by mouth 2 (two) times daily as needed. for anxiety   amLODipine 5 MG tablet Commonly known as: NORVASC Take 1 tablet (5 mg total) by mouth daily.   BARIATRIC MULTIVITAMINS/IRON PO Take 3 tablets by mouth daily.   fexofenadine  180 MG tablet Commonly known as: ALLEGRA Take 180 mg by mouth daily.   fluticasone 50 MCG/ACT nasal spray Commonly known as: FLONASE Place 1 spray into both nostrils daily.   potassium chloride SA 20 MEQ tablet Commonly known as: KLOR-CON Take 1 tablet (20 mEq total) by mouth daily.   solifenacin 10 MG tablet Commonly known as: VESIcare Take 1 tablet (10 mg total) by mouth daily.   venlafaxine XR 150 MG 24 hr capsule Commonly known as: EFFEXOR-XR TAKE 1 CAPSULE BY MOUTH DAILY. What changed: when to take this       Consultations:  Cardiology  Procedures/Studies:    No results found.     The results of significant diagnostics from this hospitalization (including imaging, microbiology, ancillary and laboratory) are listed below for reference.     Microbiology: No results found for this or any previous visit (from the past 240 hour(s)).   Labs: BNP (last 3 results) No results for input(s): BNP in the last 8760 hours. Basic Metabolic Panel: Recent Labs  Lab 11/15/19 0510  NA  145  K 2.9*  CL 105  CO2 31  GLUCOSE 102*  BUN 9  CREATININE 0.77  CALCIUM 8.6*  MG 2.1   Liver Function Tests: Recent Labs  Lab 11/15/19 0510  AST 24  ALT 21  ALKPHOS 109  BILITOT 0.4  PROT 6.0*  ALBUMIN 3.4*   No results for input(s): LIPASE, AMYLASE in the last 168 hours. No results for input(s): AMMONIA in the last 168 hours. CBC: Recent Labs  Lab 11/15/19 0510  WBC 8.7  NEUTROABS 5.2  HGB 11.6*  HCT 35.0*  MCV 88.2  PLT 347   Cardiac Enzymes: No results for input(s): CKTOTAL, CKMB, CKMBINDEX, TROPONINI in the last 168 hours. BNP: Invalid input(s): POCBNP CBG: No results for input(s): GLUCAP in the last 168 hours. D-Dimer No results for input(s): DDIMER in the last 72 hours. Hgb A1c No results for input(s): HGBA1C in the last 72 hours. Lipid Profile No results for input(s): CHOL, HDL, LDLCALC, TRIG, CHOLHDL, LDLDIRECT in the last 72 hours. Thyroid  function studies No results for input(s): TSH, T4TOTAL, T3FREE, THYROIDAB in the last 72 hours.  Invalid input(s): FREET3 Anemia work up No results for input(s): VITAMINB12, FOLATE, FERRITIN, TIBC, IRON, RETICCTPCT in the last 72 hours. Urinalysis    Component Value Date/Time   BILIRUBINUR 1+ 08/20/2019 1006   PROTEINUR Positive (A) 08/20/2019 1006   UROBILINOGEN negative (A) 08/20/2019 1006   NITRITE neg 08/20/2019 1006   LEUKOCYTESUR Negative 08/20/2019 1006   Sepsis Labs Invalid input(s): PROCALCITONIN,  WBC,  LACTICIDVEN   Time coordinating discharge: 35 minutes  SIGNED:  Mercy Riding, MD  [facility] 11/15/2019, 12:01 PM  If 7PM-7AM, please contact night-coverage [email or web address] Password [email or web address]

## 2019-11-15 NOTE — Progress Notes (Addendum)
Placed orders for BMET to be drawn 1 week prior to CTA and Metoprolol Tartrate (Lopressor) 50 mg to take 2 hours orior to CTA

## 2019-11-16 ENCOUNTER — Telehealth: Payer: Self-pay

## 2019-11-16 NOTE — Telephone Encounter (Addendum)
Left a voice message for the patient to give me a call back at the office so that I can inform her about the Metoprolol Tartrate she is to take 2 hours prior to CTA. I will try calling the patient again.   ----- Message from Azalee Course, [place] sent at 11/15/2019  9:20 AM EDT ----- Regarding: Outpatient coronary CTA and followup with Dr. Jacques Navy Patient seen by Dr. Jacques Navy in the hospital who will become her new primary cardiologist. Plan arrange outpatient coronary CTA (Dr. Jacques Navy to read) and followup with Dr. Jacques Navy around June 24th. (hopefully we can get the coronary CTA done prior to that)  Gaspar Cola, can you call the patient and give her a dose of metoprolol tartrate 50mg  tablet prior to coronary CT.  Please call the patient to arrange.    Thank you  

## 2019-11-18 ENCOUNTER — Other Ambulatory Visit: Payer: Self-pay

## 2019-11-18 ENCOUNTER — Telehealth: Payer: Self-pay | Admitting: Internal Medicine

## 2019-11-18 ENCOUNTER — Encounter: Payer: Self-pay | Admitting: Internal Medicine

## 2019-11-18 ENCOUNTER — Ambulatory Visit (INDEPENDENT_AMBULATORY_CARE_PROVIDER_SITE_OTHER): Payer: BC Managed Care – PPO | Admitting: Internal Medicine

## 2019-11-18 VITALS — BP 150/90 | HR 76 | Temp 98.8°F | Ht 61.0 in | Wt 151.4 lb

## 2019-11-18 DIAGNOSIS — Z0000 Encounter for general adult medical examination without abnormal findings: Secondary | ICD-10-CM

## 2019-11-18 DIAGNOSIS — J4520 Mild intermittent asthma, uncomplicated: Secondary | ICD-10-CM

## 2019-11-18 DIAGNOSIS — I10 Essential (primary) hypertension: Secondary | ICD-10-CM

## 2019-11-18 DIAGNOSIS — Z8709 Personal history of other diseases of the respiratory system: Secondary | ICD-10-CM | POA: Diagnosis not present

## 2019-11-18 DIAGNOSIS — R079 Chest pain, unspecified: Secondary | ICD-10-CM | POA: Diagnosis not present

## 2019-11-18 DIAGNOSIS — E782 Mixed hyperlipidemia: Secondary | ICD-10-CM

## 2019-11-18 MED ORDER — ALBUTEROL SULFATE HFA 108 (90 BASE) MCG/ACT IN AERS: 2.0000 | INHALATION_SPRAY | Freq: Four times a day (QID) | RESPIRATORY_TRACT | 3 refills | Status: DC | PRN

## 2019-11-18 MED ORDER — PROMETHAZINE HCL 25 MG PO TABS
25.0000 mg | ORAL_TABLET | Freq: Three times a day (TID) | ORAL | 0 refills | Status: DC | PRN
Start: 2019-11-18 — End: 2021-09-13

## 2019-11-18 MED ORDER — AMLODIPINE BESYLATE 10 MG PO TABS: 10.0000 mg | ORAL_TABLET | Freq: Every day | ORAL | 3 refills | Status: DC

## 2019-11-18 NOTE — Progress Notes (Signed)
   Subjective:   Patient ID: [name] [name], female    DOB: [birth year], 64 y.o.   MRN: [id number]  HPI The patient is a 64 YO female coming in for physical. Recent overnight hospital stay and getting CT cardiac in 2 weeks for risk assessment.   PMH, [facility], social history reviewed and updated  Review of Systems  Constitutional: Negative.   HENT: Negative.   Eyes: Negative.   Respiratory: Negative for cough, chest tightness and shortness of breath.   Cardiovascular: Negative for chest pain, palpitations and leg swelling.  Gastrointestinal: Negative for abdominal distention, abdominal pain, constipation, diarrhea, nausea and vomiting.  Musculoskeletal: Negative.   Skin: Negative.   Neurological: Negative.   Psychiatric/Behavioral: Negative.     Objective:  Physical Exam Constitutional:      Appearance: She is well-developed.  HENT:     Head: Normocephalic and atraumatic.  Cardiovascular:     Rate and Rhythm: Normal rate and regular rhythm.  Pulmonary:     Effort: Pulmonary effort is normal. No respiratory distress.     Breath sounds: Normal breath sounds. No wheezing or rales.  Abdominal:     General: Bowel sounds are normal. There is no distension.     Palpations: Abdomen is soft.     Tenderness: There is no abdominal tenderness. There is no rebound.  Musculoskeletal:     Cervical back: Normal range of motion.  Skin:    General: Skin is warm and dry.  Neurological:     Mental Status: She is alert and oriented to person, place, and time.     Coordination: Coordination normal.     Vitals:   11/18/19 0914  BP: (!) 150/90  Pulse: 76  Temp: 98.8 F (37.1 C)  SpO2: 99%  Weight: 151 lb 6.4 oz (68.7 kg)  Height: 5\' 1"  (1.549 m)    This visit occurred during the SARS-CoV-2 public health emergency.  Safety protocols were in place, including screening questions prior to the visit, additional usage of staff PPE, and extensive cleaning of exam room while observing appropriate  contact time as indicated for disinfecting solutions.   Assessment & Plan:

## 2019-11-18 NOTE — Assessment & Plan Note (Signed)
Needs refill ventolin as hers is expired. No flare today and has not used albuterol inhaler recently.

## 2019-11-18 NOTE — Assessment & Plan Note (Addendum)
Checking lipid panel and adjust as needed.  

## 2019-11-18 NOTE — Assessment & Plan Note (Signed)
BP close to goal on amlodipine and will continue. She tried going off but BP did elevate.

## 2019-11-18 NOTE — Assessment & Plan Note (Signed)
Getting CT cardiac score in the next few weeks. We talked about how this is more likely esophageal spasm and this can be resolved with nitro as well. Rx phenergan in case she gets intractable vomiting again in the future.

## 2019-11-18 NOTE — Patient Instructions (Signed)
Health Maintenance, Female Adopting a healthy lifestyle and getting preventive care are important in promoting health and wellness. Ask your health care provider about:  The right schedule for you to have regular tests and exams.  Things you can do on your own to prevent diseases and keep yourself healthy. What should I know about diet, weight, and exercise? Eat a healthy diet   Eat a diet that includes plenty of vegetables, fruits, low-fat dairy products, and lean protein.  Do not eat a lot of foods that are high in solid fats, added sugars, or sodium. Maintain a healthy weight Body mass index (BMI) is used to identify weight problems. It estimates body fat based on height and weight. Your health care provider can help determine your BMI and help you achieve or maintain a healthy weight. Get regular exercise Get regular exercise. This is one of the most important things you can do for your health. Most adults should:  Exercise for at least 150 minutes each week. The exercise should increase your heart rate and make you sweat (moderate-intensity exercise).  Do strengthening exercises at least twice a week. This is in addition to the moderate-intensity exercise.  Spend less time sitting. Even light physical activity can be beneficial. Watch cholesterol and blood lipids Have your blood tested for lipids and cholesterol at 65 years of age, then have this test every 5 years. Have your cholesterol levels checked more often if:  Your lipid or cholesterol levels are high.  You are older than 65 years of age.  You are at high risk for heart disease. What should I know about cancer screening? Depending on your health history and family history, you may need to have cancer screening at various ages. This may include screening for:  Breast cancer.  Cervical cancer.  Colorectal cancer.  Skin cancer.  Lung cancer. What should I know about heart disease, diabetes, and high blood  pressure? Blood pressure and heart disease  High blood pressure causes heart disease and increases the risk of stroke. This is more likely to develop in people who have high blood pressure readings, are of African descent, or are overweight.  Have your blood pressure checked: ? Every 3-5 years if you are 18-39 years of age. ? Every year if you are 40 years old or older. Diabetes Have regular diabetes screenings. This checks your fasting blood sugar level. Have the screening done:  Once every three years after age 40 if you are at a normal weight and have a low risk for diabetes.  More often and at a younger age if you are overweight or have a high risk for diabetes. What should I know about preventing infection? Hepatitis B If you have a higher risk for hepatitis B, you should be screened for this virus. Talk with your health care provider to find out if you are at risk for hepatitis B infection. Hepatitis C Testing is recommended for:  Everyone born from 1945 through 1965.  Anyone with known risk factors for hepatitis C. Sexually transmitted infections (STIs)  Get screened for STIs, including gonorrhea and chlamydia, if: ? You are sexually active and are younger than 65 years of age. ? You are older than 65 years of age and your health care provider tells you that you are at risk for this type of infection. ? Your sexual activity has changed since you were last screened, and you are at increased risk for chlamydia or gonorrhea. Ask your health care provider if   you are at risk.  Ask your health care provider about whether you are at high risk for HIV. Your health care provider may recommend a prescription medicine to help prevent HIV infection. If you choose to take medicine to prevent HIV, you should first get tested for HIV. You should then be tested every 3 months for as long as you are taking the medicine. Pregnancy  If you are about to stop having your period (premenopausal) and  you may become pregnant, seek counseling before you get pregnant.  Take 400 to 800 micrograms (mcg) of folic acid every day if you become pregnant.  Ask for birth control (contraception) if you want to prevent pregnancy. Osteoporosis and menopause Osteoporosis is a disease in which the bones lose minerals and strength with aging. This can result in bone fractures. If you are 65 years old or older, or if you are at risk for osteoporosis and fractures, ask your health care provider if you should:  Be screened for bone loss.  Take a calcium or vitamin D supplement to lower your risk of fractures.  Be given hormone replacement therapy (HRT) to treat symptoms of menopause. Follow these instructions at home: Lifestyle  Do not use any products that contain nicotine or tobacco, such as cigarettes, e-cigarettes, and chewing tobacco. If you need help quitting, ask your health care provider.  Do not use street drugs.  Do not share needles.  Ask your health care provider for help if you need support or information about quitting drugs. Alcohol use  Do not drink alcohol if: ? Your health care provider tells you not to drink. ? You are pregnant, may be pregnant, or are planning to become pregnant.  If you drink alcohol: ? Limit how much you use to 0-1 drink a day. ? Limit intake if you are breastfeeding.  Be aware of how much alcohol is in your drink. In the [place] one drink equals one 12 oz bottle of beer (355 mL), one 5 oz glass of wine (148 mL), or one 1 oz glass of hard liquor (44 mL). General instructions  Schedule regular health, dental, and eye exams.  Stay current with your vaccines.  Tell your health care provider if: ? You often feel depressed. ? You have ever been abused or do not feel safe at home. Summary  Adopting a healthy lifestyle and getting preventive care are important in promoting health and wellness.  Follow your health care provider's instructions about healthy  diet, exercising, and getting tested or screened for diseases.  Follow your health care provider's instructions on monitoring your cholesterol and blood pressure. This information is not intended to replace advice given to you by your health care provider. Make sure you discuss any questions you have with your health care provider. Document Revised: 05/27/2018 Document Reviewed: 05/27/2018 Elsevier Patient Education  2020 [employer]  

## 2019-11-18 NOTE — Assessment & Plan Note (Signed)
Flu shot due next season. Covid-19 complete. Shingrix complete. Tetanus up to date. Colonoscopy up to date. Mammogram due, she will get done after daughter's wedding, pap smear due and will get with gyn after daughter's wedding. Counseled about sun safety and mole surveillance. Counseled about the dangers of distracted driving. Given 10 year screening recommendations.

## 2019-11-18 NOTE — Telephone Encounter (Signed)
   Went to chart to check who called pt. Transferred call to [place] 

## 2019-12-07 ENCOUNTER — Telehealth (HOSPITAL_COMMUNITY): Payer: Self-pay | Admitting: *Deleted

## 2019-12-07 DIAGNOSIS — R072 Precordial pain: Secondary | ICD-10-CM

## 2019-12-07 NOTE — Telephone Encounter (Signed)
Pt returning call regarding upcoming cardiac imaging study; pt verbalizes understanding of appt date/time, parking situation and where to check in, pre-test NPO status and medications ordered, and verified current allergies; name and call back number provided for further questions should they arise  Christeen Lai Tai RN Navigator Cardiac Imaging [facility] [phone] office [phone] cell  

## 2019-12-07 NOTE — Telephone Encounter (Signed)
Attempted to call patient regarding upcoming cardiac CT appointment. Left message on voicemail with name and callback number  Colter Magowan Tai RN Navigator Cardiac Imaging [facility] [phone] Office [phone] Cell  

## 2019-12-08 ENCOUNTER — Other Ambulatory Visit: Payer: Self-pay

## 2019-12-08 ENCOUNTER — Ambulatory Visit (HOSPITAL_COMMUNITY)
Admission: RE | Admit: 2019-12-08 | Discharge: 2019-12-08 | Disposition: A | Discharge status: Home / Self Care | Payer: BC Managed Care – PPO | Source: Ambulatory Visit | Attending: Physician Assistant | Admitting: Physician Assistant

## 2019-12-08 DIAGNOSIS — R072 Precordial pain: Secondary | ICD-10-CM | POA: Insufficient documentation

## 2019-12-08 IMAGING — CT CT HEART MORP W/ CTA COR W/ SCORE W/ CA W/CM &/OR W/O CM
4 of 7 series · 8 of 20 positions shown, 9 images · IV contrast (APPLIED)
Comparison: None.
COMPARISON: None.

Addendum:
EXAM:
OVER-READ INTERPRETATION  CT CHEST

The following report is an over-read performed by radiologist Dr.
Jhoinner Arabia [REDACTED] on 12/08/2019. This
over-read does not include interpretation of cardiac or coronary
anatomy or pathology. The coronary calcium score/coronary CTA
interpretation by the cardiologist is attached.
HISTORY: Chest pain, nonspecific
Cardiac/Coronary  CT
TECHNIQUE: The patient was scanned on a Siemens Force scanner.
PROTOCOL: A 120 kV prospective scan was triggered in the descending thoracic
aorta at 111 HU's. Axial non-contrast 3 mm slices were carried out
through the heart. The data set was analyzed on a dedicated work
station and scored using the Agatson method. Gantry rotation speed
was 250 msecs and collimation was .6 mm. Beta blockade and 0.8 mg of
sl NTG was given. The 3D data set was reconstructed in 5% intervals
of the 67-82 % of the R-R cycle. Diastolic phases were analyzed on a
dedicated work station using MPR, MIP and VRT modes. The patient
received 80mL OMNIPAQUE IOHEXOL 350 MG/ML SOLN of contrast.

[Series 6: best diast 75 % · axial · 0.34mm/px · z∈[-289,-243]mm · 2 of 346 slices shown, 3 images]
[im 116/346  vessel]
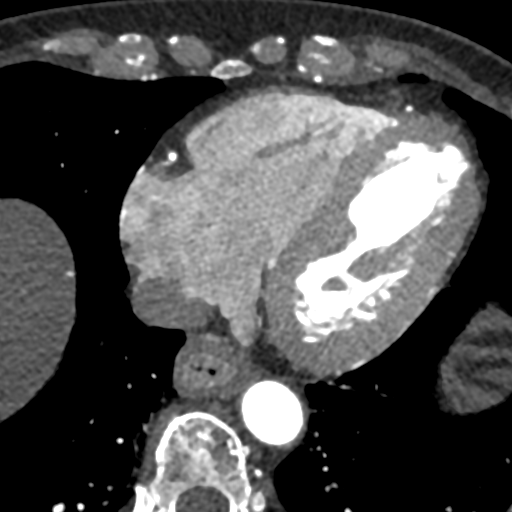
[im 116/346  lung]
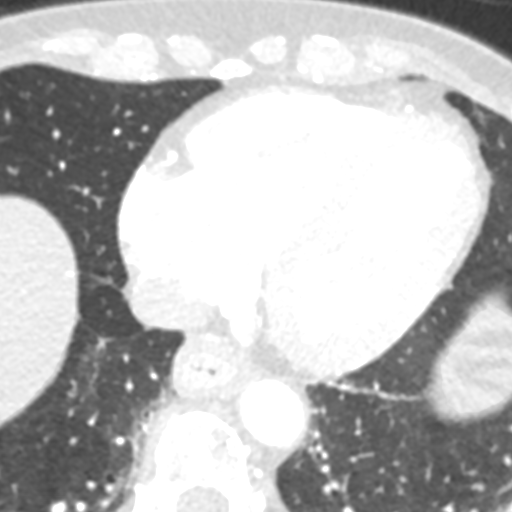
[im 231/346  vessel]
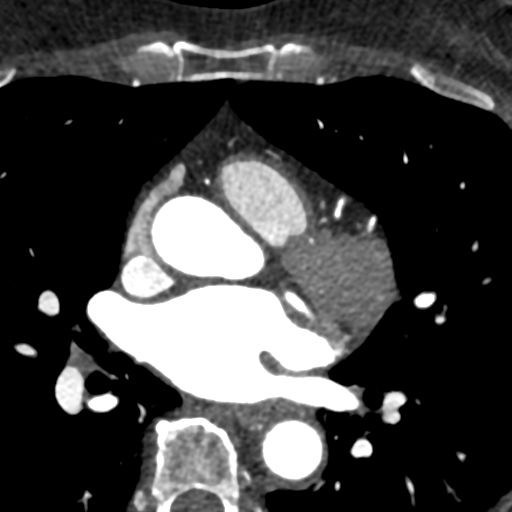

[Series 7: best syst 32 % · axial · 0.34mm/px · z∈[-289,-243]mm · 2 of 346 slices shown]
[im 116/346  vessel]
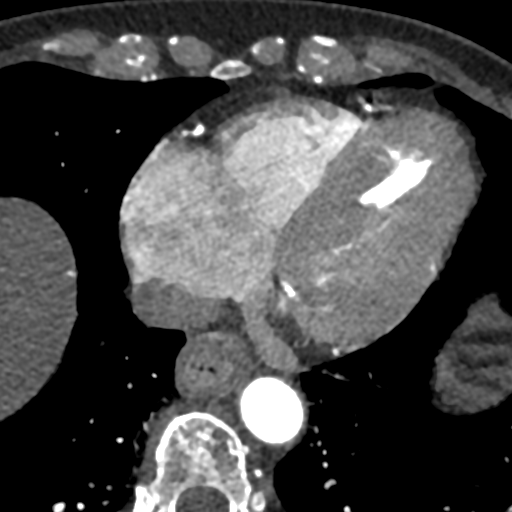
[im 231/346  vessel]
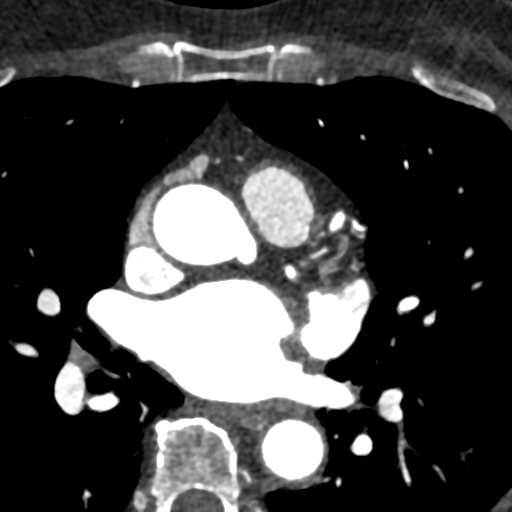

[Series 8: ts diast sharp 75 % · axial · 0.34mm/px · z∈[-289,-243]mm · 2 of 346 slices shown]
[im 116/346  lung]
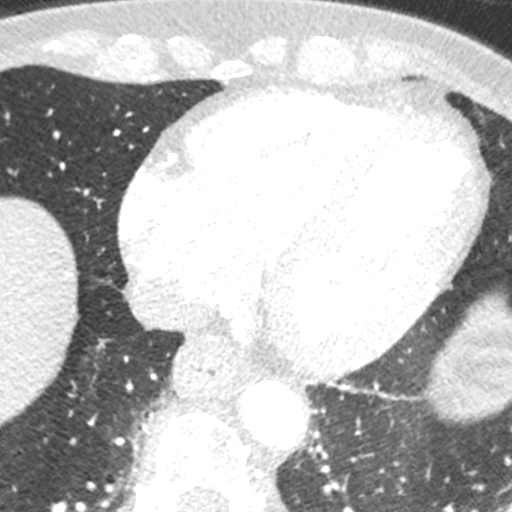
[im 231/346  lung]
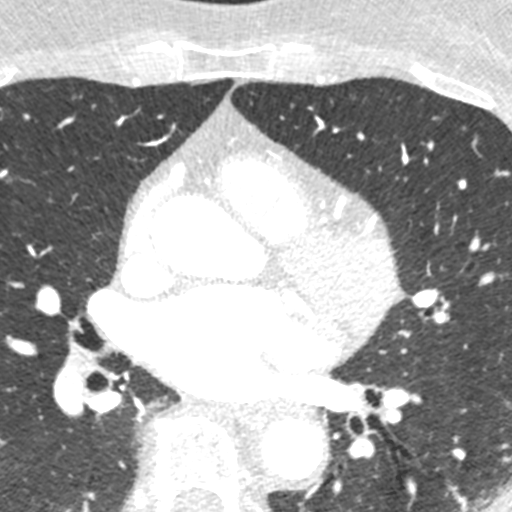

[Series 9: ts syst sharp 32 % · axial · 0.34mm/px · z∈[-289,-243]mm · 2 of 346 slices shown]
[im 116/346  lung]
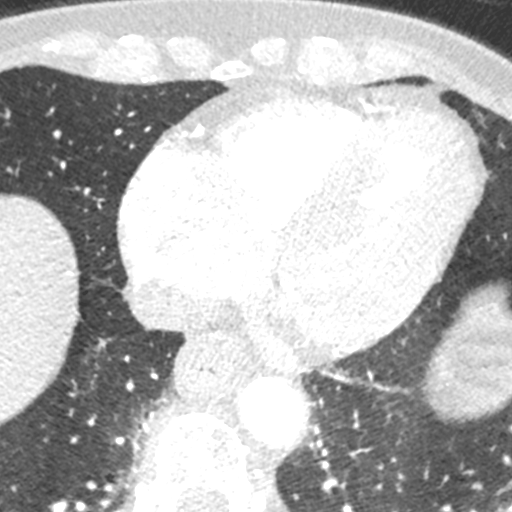
[im 231/346  lung]
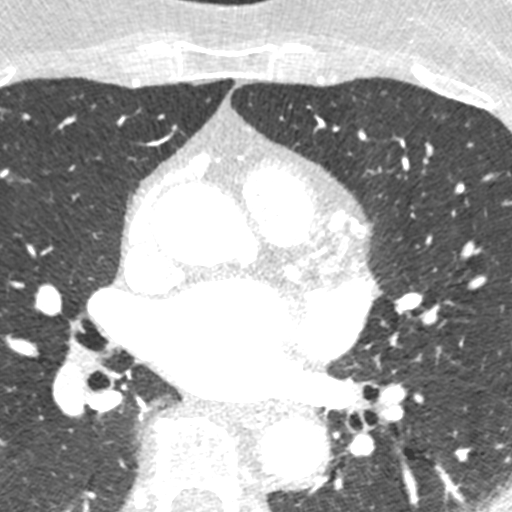

[8 of 20 positions shown; findings below may reference images not displayed]

FINDINGS: Aortic atherosclerosis. Small hiatal hernia. Multiple tiny 2-4 mm
pulmonary nodules are noted in the lungs bilaterally within the
visualized portions of the thorax there are no other larger more
suspicious appearing pulmonary nodules or masses, there is no acute
consolidative airspace disease, no pleural effusions, no
pneumothorax and no lymphadenopathy. Visualized portions of the
upper abdomen are unremarkable. There are no aggressive appearing
lytic or blastic lesions noted in the visualized portions of the
skeleton.
IMPRESSION: 1. Small pulmonary nodules are noted in the lungs bilaterally
measuring 2-4 mm in size, nonspecific, but statistically likely
benign. No follow-up needed if patient is low-risk (and has no known
or suspected primary neoplasm). Non-contrast chest CT can be
considered in 12 months if patient is high-risk. This recommendation
follows the consensus statement: Guidelines for Management of
Incidental Pulmonary Nodules Detected on CT Images: From the
2. Aortic Atherosclerosis (80BO5-06U.U).
3. Small hiatal hernia.
FINDINGS: Coronary calcium score is 62, which places the patient in the 78th
percentile for age and sex matched control.

Coronary arteries: Normal coronary origins.  Right dominance.

Right Coronary Artery: Tortuous mid-distal RCA at the takeoff of the
RV branch, with possible mild atherosclerotic plaque, 25-49%
stenosis, though luminal stenosis challenging to assess.

Left Main Coronary Artery: No detectable plaque or stenosis.

Left Anterior Descending Coronary Artery: Moderate mixed
atherosclerotic plaque in the proximal LAD, 50-69% stenosis.

Left Circumflex Artery: No detectable plaque or stenosis.

Aorta: Normal size, 27 mm at the mid ascending aorta (level of the
PA bifurcation) measured double oblique. No calcifications. No
dissection.

Aortic Valve: No calcifications.

Other findings:

Normal pulmonary vein drainage into the left atrium.

Normal left atrial appendage without a thrombus.

Normal size of the pulmonary artery.
IMPRESSION: 1. Moderate CAD in proximal LAD, CADRADS = 3. CT FFR will be
performed and reported separately.

2. Coronary calcium score is 62, which places the patient in the
78th percentile for age and sex matched control.

3. Normal coronary origin with right dominance.

*** End of Addendum ***
EXAM:
OVER-READ INTERPRETATION  CT CHEST

The following report is an over-read performed by radiologist Dr.
Jhoinner Arabia [REDACTED] on 12/08/2019. This
over-read does not include interpretation of cardiac or coronary
anatomy or pathology. The coronary calcium score/coronary CTA
interpretation by the cardiologist is attached.
FINDINGS: Aortic atherosclerosis. Small hiatal hernia. Multiple tiny 2-4 mm
pulmonary nodules are noted in the lungs bilaterally within the
visualized portions of the thorax there are no other larger more
suspicious appearing pulmonary nodules or masses, there is no acute
consolidative airspace disease, no pleural effusions, no
pneumothorax and no lymphadenopathy. Visualized portions of the
upper abdomen are unremarkable. There are no aggressive appearing
lytic or blastic lesions noted in the visualized portions of the
skeleton.
IMPRESSION: 1. Small pulmonary nodules are noted in the lungs bilaterally
measuring 2-4 mm in size, nonspecific, but statistically likely
benign. No follow-up needed if patient is low-risk (and has no known
or suspected primary neoplasm). Non-contrast chest CT can be
considered in 12 months if patient is high-risk. This recommendation
follows the consensus statement: Guidelines for Management of
Incidental Pulmonary Nodules Detected on CT Images: From the
2. Aortic Atherosclerosis (80BO5-06U.U).
3. Small hiatal hernia.

## 2019-12-08 MED ORDER — NITROGLYCERIN 0.4 MG SL SUBL
0.8000 mg | SUBLINGUAL_TABLET | Freq: Once | SUBLINGUAL | Status: AC
  Administered 2019-12-08: 0.8 mg via SUBLINGUAL

## 2019-12-08 MED ORDER — IOHEXOL 350 MG/ML SOLN
80.0000 mL | Freq: Once | INTRAVENOUS | Status: AC | PRN
  Administered 2019-12-08: 80 mL via INTRAVENOUS

## 2019-12-08 MED ORDER — NITROGLYCERIN 0.4 MG SL SUBL
SUBLINGUAL_TABLET | SUBLINGUAL | Status: AC
  Filled 2019-12-08: qty 2

## 2019-12-08 NOTE — Progress Notes (Signed)
CT scan completed. Tolerated well. D/C home ambulatory, awake and alert. In no distress. 

## 2019-12-09 ENCOUNTER — Ambulatory Visit (INDEPENDENT_AMBULATORY_CARE_PROVIDER_SITE_OTHER): Payer: BC Managed Care – PPO | Admitting: Internal Medicine

## 2019-12-09 ENCOUNTER — Other Ambulatory Visit: Payer: Self-pay | Admitting: Internal Medicine

## 2019-12-09 ENCOUNTER — Encounter: Payer: Self-pay | Admitting: Internal Medicine

## 2019-12-09 VITALS — BP 140/86 | HR 78 | Ht 61.0 in | Wt 144.4 lb

## 2019-12-09 DIAGNOSIS — E785 Hyperlipidemia, unspecified: Secondary | ICD-10-CM | POA: Diagnosis not present

## 2019-12-09 DIAGNOSIS — R072 Precordial pain: Secondary | ICD-10-CM | POA: Diagnosis not present

## 2019-12-09 DIAGNOSIS — J4520 Mild intermittent asthma, uncomplicated: Secondary | ICD-10-CM

## 2019-12-09 DIAGNOSIS — I2510 Atherosclerotic heart disease of native coronary artery without angina pectoris: Secondary | ICD-10-CM | POA: Diagnosis not present

## 2019-12-09 DIAGNOSIS — Q796 Ehlers-Danlos syndrome, unspecified: Secondary | ICD-10-CM

## 2019-12-09 DIAGNOSIS — Z79899 Other long term (current) drug therapy: Secondary | ICD-10-CM

## 2019-12-09 DIAGNOSIS — I10 Essential (primary) hypertension: Secondary | ICD-10-CM | POA: Diagnosis not present

## 2019-12-09 MED ORDER — ATORVASTATIN CALCIUM 40 MG PO TABS
40.0000 mg | ORAL_TABLET | Freq: Every day | ORAL | 3 refills | Status: DC
Start: 2019-12-09 — End: 2020-11-20

## 2019-12-09 MED ORDER — ASPIRIN EC 81 MG PO TBEC
81.0000 mg | DELAYED_RELEASE_TABLET | Freq: Every day | ORAL | 3 refills | Status: DC
Start: 2019-12-09 — End: 2022-10-20

## 2019-12-09 NOTE — Patient Instructions (Signed)
Medication Instructions:  Start taking Atorvastatin 40 mg one tablet at bedtime daily  Start taking Enteric coated aspirin 81 mg one tablet daily   *If you need a refill on your cardiac medications before your next appointment, please call your pharmacy*   Lab Work: cmp Lipid in 6 to 8 weeks - fasting  If you have labs (blood work) drawn today and your tests are completely normal, you will receive your results only by:  MyChart Message (if you have MyChart) OR  A paper copy in the mail If you have any lab test that is abnormal or we need to change your treatment, we will call you to review the results.   Testing/Procedures: Not needed   Follow-Up: At [facility], you and your health needs are our priority.  As part of our continuing mission to provide you with exceptional heart care, we have created designated Provider Care Teams.  These Care Teams include your primary Cardiologist (physician) and Advanced Practice Providers (APPs -  Physician Assistants and Nurse Practitioners) who all work together to provide you with the care you need, when you need it.  We recommend signing up for the patient portal called "MyChart".  Sign up information is provided on this After Visit Summary.  MyChart is used to connect with patients for Virtual Visits (Telemedicine).  Patients are able to view lab/test results, encounter notes, upcoming appointments, etc.  Non-urgent messages can be sent to your provider as well.   To learn more about what you can do with MyChart, go to [email or web address].    Your next appointment:   6 month(s)  The format for your next appointment:   In Person  Provider:   Weston Brass, MD   Other Instructions

## 2019-12-09 NOTE — Progress Notes (Signed)
Please discuss during today's visit with Dr. Jacques Navy at 11:00AM, moderate disease noted, FFR negative.

## 2019-12-09 NOTE — Progress Notes (Signed)
Cardiology Office Note:    Date:  12/09/2019   ID:  [name], DOB [birth year], MRN [id number]  PCP:  Hoyt Koch, MD  Cardiologist:  Elouise Munroe, MD  Electrophysiologist:  None   Referring MD: Hoyt Koch, *   Chief Complaint: hospital follow up  History of Present Illness:    [name] [name] is a 64 y.o. female with a history of Ehlers-Danlos syndrome type III, fatty liver, fibromyalgia, hypertension (resolved upon weight loss), hyperlipidemia, prediabetes and history of obesity s/p bariatric surgery in 2017.   Symptoms on presentation to hospital seemed most consistent with esophageal spasm after food was stuck briefly in her esophagus by subjective report. Due to CAC on CTPE, Q waves on ECG and risk factors, we determined in shared decision making to perform CCTA as an outpatient. We reviewed these results today showing moderate, nonobstructive CAD. We discussed medical management of CAD.   No recurrence of chest pain. No SOB or palpitations.   Past Medical History:  Diagnosis Date  . Allergy    seasonal  . Anxiety   . Arthritis   . Asthma   . Complication of anesthesia    headache after neck surgery  . Ehlers-Danlos syndrome type III   . Fatty liver   . Fibromyalgia   . GERD (gastroesophageal reflux disease)    history of  . Heart murmur   . History of blood in urine   . History of bronchitis   . History of cholelithiasis   . History of kidney stones   . Hyperlipemia   . Hyperparathyroidism ([place])   . Hypertension    no medication needed since bariatric surgery  . Insomnia   . Leg pain   . Low back pain   . Obese    history of  . Pneumonia    history of   . Pre-diabetes    no since weight loss  . Sleep apnea    improved since weight loss    Past Surgical History:  Procedure Laterality Date  . achilles tendon tibial tendon fusion     4 surgeries  . CERVICAL DISCECTOMY     2010  . CESAREAN SECTION    . CHOLECYSTECTOMY     . COLONOSCOPY    . CYSTOSCOPY WITH RETROGRADE PYELOGRAM, URETEROSCOPY AND STENT PLACEMENT Right 08/01/2017   Procedure: CYSTOSCOPY WITH RETROGRADE PYELOGRAM, URETEROSCOPY AND STENT PLACEMENT;  Surgeon: Alexis Frock, MD;  Location: [facility];  Service: Urology;  Laterality: Right;  . HOLMIUM LASER APPLICATION Right 1/95/0932   Procedure: HOLMIUM LASER APPLICATION;  Surgeon: Alexis Frock, MD;  Location: [facility];  Service: Urology;  Laterality: Right;  . JOINT REPLACEMENT Right    partial joint replacement knee  . LAPAROSCOPIC GASTRIC RESTRICTIVE DUODENAL PROCEDURE (DUODENAL SWITCH)     2017  . lower back     2008  . spinal injections    . SPINE SURGERY     L4-L5, C6-C7     Current Medications: Current Meds  Medication Sig  . albuterol (VENTOLIN HFA) 108 (90 Base) MCG/ACT inhaler Inhale 2 puffs into the lungs every 6 (six) hours as needed for wheezing.  Marland Kitchen ALPRAZolam (XANAX) 0.5 MG tablet Take 1 tablet (0.5 mg total) by mouth 2 (two) times daily as needed. for anxiety  . amLODipine (NORVASC) 10 MG tablet Take 1 tablet (10 mg total) by mouth daily.  . fexofenadine (ALLEGRA) 180 MG tablet Take 180 mg by mouth daily.  Marland Kitchen  fluticasone (FLONASE) 50 MCG/ACT nasal spray Place 1 spray into both nostrils daily.   . metoprolol tartrate (LOPRESSOR) 50 MG tablet Take 2 hours prior to procedure  . Multiple Vitamins-Minerals (BARIATRIC MULTIVITAMINS/IRON PO) Take 3 tablets by mouth daily.   . potassium chloride SA (KLOR-CON) 20 MEQ tablet Take 1 tablet (20 mEq total) by mouth daily.  . promethazine (PHENERGAN) 25 MG tablet Take 1 tablet (25 mg total) by mouth every 8 (eight) hours as needed for nausea or vomiting.  . solifenacin (VESICARE) 10 MG tablet Take 1 tablet (10 mg total) by mouth daily.  Marland Kitchen venlafaxine XR (EFFEXOR-XR) 150 MG 24 hr capsule TAKE 1 CAPSULE BY MOUTH DAILY.  . [DISCONTINUED] DOXYCYCLINE CALCIUM PO Take by mouth.      Allergies:   Patient has  no known allergies.   Social History   Tobacco Use  . Smoking status: Never Smoker  . Smokeless tobacco: Never Used  Vaping Use  . Vaping Use: Never used  Substance Use Topics  . Alcohol use: Yes    Alcohol/week: 0.0 standard drinks    Comment: rare  . Drug use: No     Family History: The patient's family history includes Healthy in her daughter; Heart disease in her father; Pancreatic cancer in her sister; Parkinson's disease in her mother. There is no history of Colon cancer, Colon polyps, Esophageal cancer, Rectal cancer, or Stomach cancer.  ROS:   Please see the history of present illness.    All other systems reviewed and are negative.  EKGs/Labs/Other Studies Reviewed:    The following studies were reviewed today:  EKG:  NSR, anterior and inferior infarct  Recent Labs: 11/15/2019: ALT 21; BUN 9; Creatinine, Ser 0.63; Hemoglobin 11.6; Magnesium 2.1; Platelets 347; Potassium 3.6; Sodium 145  Recent Lipid Panel    Component Value Date/Time   CHOL 174 11/17/2018 1053   TRIG 105.0 11/17/2018 1053   HDL 64.90 11/17/2018 1053   CHOLHDL 3 11/17/2018 1053   VLDL 21.0 11/17/2018 1053   LDLCALC 88 11/17/2018 1053    Physical Exam:    VS:  BP 140/86   Pulse 78   Ht 5\' 1"  (1.549 m)   Wt 144 lb 6.4 oz (65.5 kg)   SpO2 98%   BMI 27.28 kg/m     Wt Readings from Last 5 Encounters:  12/09/19 144 lb 6.4 oz (65.5 kg)  11/18/19 151 lb 6.4 oz (68.7 kg)  11/15/19 146 lb 12.8 oz (66.6 kg)  11/08/19 147 lb 6.4 oz (66.9 kg)  08/20/19 149 lb (67.6 kg)    Constitutional: No acute distress Eyes: sclera non-icteric, normal conjunctiva and lids ENMT: normal dentition, moist mucous membranes Cardiovascular: regular rhythm, normal rate, no murmurs. S1 and S2 normal. Radial pulses normal bilaterally. No jugular venous distention.  Respiratory: clear to auscultation bilaterally GI : normal bowel sounds, soft and nontender. No distention.   MSK: extremities warm, well perfused. No  edema.  NEURO: grossly nonfocal exam, moves all extremities. PSYCH: alert and oriented x 3, normal mood and affect.   ASSESSMENT:    1. Coronary artery disease involving native coronary artery of native heart without angina pectoris   2. Precordial pain   3. Hyperlipidemia LDL goal <70   4. Essential hypertension   5. Mild intermittent chronic asthma without complication   6. Ehlers-Danlos syndrome   7. Medication management    PLAN:    Chest pain Coronary artery disease involving native coronary artery of native heart without angina pectoris -  Plan: EKG 12-Lead, Lipid panel, Comprehensive metabolic panel, Comprehensive metabolic panel, Lipid panel - Will start ASA 81 mg daily and statin therapy.   Hyperlipidemia LDL goal <70 - Plan: EKG 12-Lead, Lipid panel, Comprehensive metabolic panel, Comprehensive metabolic panel, Lipid panel - atorvastatin 40 mg daily  Essential hypertension - continue amlodipine 10 mg daily  Mild intermittent chronic asthma without complication  Ehlers-Danlos syndrome - no vascular ectasias noted in visualized portions of CCTA. Will follow.  Total time of encounter: 30 minutes total time of encounter, including 23 minutes spent in face-to-face patient care on the date of this encounter. This time includes coordination of care and counseling regarding above mentioned problem list. Remainder of non-face-to-face time involved reviewing chart documents/testing relevant to the patient encounter and documentation in the medical record. I have independently reviewed documentation from referring provider.   Weston Brass, MD [place]  [facility]    Medication Adjustments/Labs and Tests Ordered: Current medicines are reviewed at length with the patient today.  Concerns regarding medicines are outlined above.  Orders Placed This Encounter  Procedures  . Lipid panel  . Comprehensive metabolic panel  . EKG 12-Lead   Meds ordered this encounter   Medications  . aspirin EC 81 MG tablet    Sig: Take 1 tablet (81 mg total) by mouth daily. Swallow whole.    Dispense:  90 tablet    Refill:  3  . atorvastatin (LIPITOR) 40 MG tablet    Sig: Take 1 tablet (40 mg total) by mouth daily.    Dispense:  90 tablet    Refill:  3    Patient Instructions  Medication Instructions:  Start taking Atorvastatin 40 mg one tablet at bedtime daily  Start taking Enteric coated aspirin 81 mg one tablet daily   *If you need a refill on your cardiac medications before your next appointment, please call your pharmacy*   Lab Work: cmp Lipid in 6 to 8 weeks - fasting  If you have labs (blood work) drawn today and your tests are completely normal, you will receive your results only by: Marland Kitchen MyChart Message (if you have MyChart) OR . A paper copy in the mail If you have any lab test that is abnormal or we need to change your treatment, we will call you to review the results.   Testing/Procedures: Not needed   Follow-Up: At [facility], you and your health needs are our priority.  As part of our continuing mission to provide you with exceptional heart care, we have created designated Provider Care Teams.  These Care Teams include your primary Cardiologist (physician) and Advanced Practice Providers (APPs -  Physician Assistants and Nurse Practitioners) who all work together to provide you with the care you need, when you need it.  We recommend signing up for the patient portal called "MyChart".  Sign up information is provided on this After Visit Summary.  MyChart is used to connect with patients for Virtual Visits (Telemedicine).  Patients are able to view lab/test results, encounter notes, upcoming appointments, etc.  Non-urgent messages can be sent to your provider as well.   To learn more about what you can do with MyChart, go to [email or web address].    Your next appointment:   6 month(s)  The format for your next appointment:   In  Person  Provider:   Weston Brass, MD   Other Instructions

## 2020-01-12 ENCOUNTER — Ambulatory Visit
Admission: EM | Admit: 2020-01-12 | Discharge: 2020-01-12 | Disposition: A | Discharge status: Home / Self Care | Payer: Medicare PPO | Attending: Physician Assistant | Admitting: Physician Assistant

## 2020-01-12 ENCOUNTER — Other Ambulatory Visit: Payer: Self-pay

## 2020-01-12 DIAGNOSIS — R21 Rash and other nonspecific skin eruption: Secondary | ICD-10-CM

## 2020-01-12 MED ORDER — TRIAMCINOLONE ACETONIDE 0.025 % EX OINT
1.0000 | TOPICAL_OINTMENT | Freq: Every day | CUTANEOUS | 0 refills | Status: DC
Start: 2020-01-12 — End: 2020-11-20

## 2020-01-12 MED ORDER — PREDNISONE 50 MG PO TABS
50.0000 mg | ORAL_TABLET | Freq: Every day | ORAL | 0 refills | Status: DC
Start: 2020-01-12 — End: 2020-11-20

## 2020-01-12 NOTE — ED Triage Notes (Signed)
Pt c/o itchy rash all over since Tuesday. Denies any new products. States taking benadryl with no relief.

## 2020-01-12 NOTE — Discharge Instructions (Signed)
Prednisone as directed. Triamcinolone ointment as needed. Continue allergy medicine for itching. Monitor for any new exposures. Follow up with PCP if symptoms not improving.

## 2020-01-12 NOTE — ED Provider Notes (Signed)
[facility]    CSN: [id number] Arrival date & time: 01/12/20  0944      History   Chief Complaint Chief Complaint  Patient presents with  . Rash    HPI [name] [name] is a 64 y.o. female.   64 year old female comes in for 1 day history of rash to the whole body. States itching in nature, and can get painful at times after itching. No changes in products/food/medicines. Had tick bite few days ago, attached <1hr. Denies fever, headaches, nausea/vomiting. Benadryl without relief.      Past Medical History:  Diagnosis Date  . Allergy    seasonal  . Anxiety   . Arthritis   . Asthma   . Complication of anesthesia    headache after neck surgery  . Ehlers-Danlos syndrome type III   . Fatty liver   . Fibromyalgia   . GERD (gastroesophageal reflux disease)    history of  . Heart murmur   . History of blood in urine   . History of bronchitis   . History of cholelithiasis   . History of kidney stones   . Hyperlipemia   . Hyperparathyroidism ([facility])   . Hypertension    no medication needed since bariatric surgery  . Insomnia   . Leg pain   . Low back pain   . Obese    history of  . Pneumonia    history of   . Pre-diabetes    no since weight loss  . Sleep apnea    improved since weight loss    Patient Active Problem List   Diagnosis Date Noted  . Chest pain 11/15/2019  . Urge incontinence 08/20/2019  . Kidney stone 08/20/2019  . Insomnia 03/19/2017  . Hyperlipidemia 08/30/2016  . Hyperparathyroidism ([facility]) 08/31/2015  . Asthma, chronic 11/30/2014  . Arthritis 06/01/2014  . Routine general medical examination at a health care facility 06/01/2014  . Ehlers-Danlos syndrome 05/31/2014  . Essential hypertension 02/18/2009    Past Surgical History:  Procedure Laterality Date  . achilles tendon tibial tendon fusion     4 surgeries  . CERVICAL DISCECTOMY     2010  . CESAREAN SECTION    . CHOLECYSTECTOMY    . COLONOSCOPY    . CYSTOSCOPY WITH  RETROGRADE PYELOGRAM, URETEROSCOPY AND STENT PLACEMENT Right 08/01/2017   Procedure: CYSTOSCOPY WITH RETROGRADE PYELOGRAM, URETEROSCOPY AND STENT PLACEMENT;  Surgeon: Sebastian Ache, MD;  Location: [facility];  Service: Urology;  Laterality: Right;  . HOLMIUM LASER APPLICATION Right 08/01/2017   Procedure: HOLMIUM LASER APPLICATION;  Surgeon: Sebastian Ache, MD;  Location[facility];  Service: Urology;  Laterality: Right;  . JOINT REPLACEMENT Right    partial joint replacement knee  . LAPAROSCOPIC GASTRIC RESTRICTIVE DUODENAL PROCEDURE (DUODENAL SWITCH)     2017  . lower back     2008  . spinal injections    . SPINE SURGERY     L4-L5, C6-C7     OB History   No obstetric history on file.      Home Medications    Prior to Admission medications   Medication Sig Start Date End Date Taking? Authorizing Provider  albuterol (VENTOLIN HFA) 108 (90 Base) MCG/ACT inhaler Inhale 2 puffs into the lungs every 6 (six) hours as needed for wheezing. 11/18/19   Myrlene Broker, MD  ALPRAZolam Prudy Feeler) 0.5 MG tablet Take 1 tablet (0.5 mg total) by mouth 2 (two) times daily as needed. for  anxiety 11/08/19   Myrlene Broker, MD  amLODipine (NORVASC) 10 MG tablet Take 1 tablet (10 mg total) by mouth daily. 11/18/19   Myrlene Broker, MD  aspirin EC 81 MG tablet Take 1 tablet (81 mg total) by mouth daily. Swallow whole. 12/09/19   Parke Poisson, MD  atorvastatin (LIPITOR) 40 MG tablet Take 1 tablet (40 mg total) by mouth daily. 12/09/19 03/08/20  Parke Poisson, MD  fexofenadine (ALLEGRA) 180 MG tablet Take 180 mg by mouth daily.    [provider]  fluticasone (FLONASE) 50 MCG/ACT nasal spray Place 1 spray into both nostrils daily.     [provider]  metoprolol tartrate (LOPRESSOR) 50 MG tablet Take 2 hours prior to procedure 11/15/19   Azalee Course, PA  Multiple Vitamins-Minerals (BARIATRIC MULTIVITAMINS/IRON PO) Take 3 tablets by mouth  daily.     [provider]  potassium chloride SA (KLOR-CON) 20 MEQ tablet Take 1 tablet (20 mEq total) by mouth daily. 11/15/19   Almon Hercules, MD  predniSONE (DELTASONE) 50 MG tablet Take 1 tablet (50 mg total) by mouth daily with breakfast. 01/12/20   Cathie Hoops, Ilayda Toda V, PA-C  promethazine (PHENERGAN) 25 MG tablet Take 1 tablet (25 mg total) by mouth every 8 (eight) hours as needed for nausea or vomiting. 11/18/19   Myrlene Broker, MD  solifenacin (VESICARE) 10 MG tablet Take 1 tablet (10 mg total) by mouth daily. 11/08/19   Myrlene Broker, MD  triamcinolone (KENALOG) 0.025 % ointment Apply 1 application topically at bedtime. 01/12/20   Cathie Hoops, Quinita Kostelecky V, PA-C  venlafaxine XR (EFFEXOR-XR) 150 MG 24 hr capsule TAKE 1 CAPSULE BY MOUTH DAILY. 12/09/19   Myrlene Broker, MD    Family History Family History  Problem Relation Age of Onset  . Parkinson's disease Mother        Deceased, 1  . Heart disease Father        Deceased, 35  . Pancreatic cancer Sister        Survivor  . Healthy Daughter   . Colon cancer Neg Hx   . Colon polyps Neg Hx   . Esophageal cancer Neg Hx   . Rectal cancer Neg Hx   . Stomach cancer Neg Hx     Social History Social History   Tobacco Use  . Smoking status: Never Smoker  . Smokeless tobacco: Never Used  Vaping Use  . Vaping Use: Never used  Substance Use Topics  . Alcohol use: Yes    Alcohol/week: 0.0 standard drinks    Comment: rare  . Drug use: No     Allergies   Patient has no known allergies.   Review of Systems Review of Systems  Reason unable to perform ROS: See HPI as above.     Physical Exam Triage Vital Signs ED Triage Vitals [01/12/20 0956]  Enc Vitals Group     BP (!) 158/99     Pulse Rate (!) 106     Resp 18     Temp 98.3 F (36.8 C)     Temp Source Oral     SpO2 98 %     Weight      Height      Head Circumference      Peak Flow      Pain Score 3     Pain Loc      Pain Edu?      Excl. in GC?    No data  found.  Updated Vital Signs BP (!) 158/99 (BP Location: Left Arm)   Pulse (!) 106   Temp 98.3 F (36.8 C) (Oral)   Resp 18   SpO2 98%   Physical Exam Constitutional:      General: She is not in acute distress.    Appearance: Normal appearance. She is well-developed. She is not toxic-appearing or diaphoretic.  HENT:     Head: Normocephalic and atraumatic.  Eyes:     Conjunctiva/sclera: Conjunctivae normal.     Pupils: Pupils are equal, round, and reactive to light.  Pulmonary:     Effort: Pulmonary effort is normal. No respiratory distress.  Musculoskeletal:     Cervical back: Normal range of motion and neck supple.  Skin:    General: Skin is warm and dry.     Comments: Vesicular rash to BUE/BLE with few to the trunk. Rash worse to flexor surfaces. No erythema, warmth.  Unable to visualize tick bite to the back. No rashes to region of tick bite.   Neurological:     Mental Status: She is alert and oriented to person, place, and time.      UC Treatments / Results  Labs (all labs ordered are listed, but only abnormal results are displayed) Labs Reviewed - No data to display  EKG   Radiology No results found.  Procedures Procedures (including critical care time)  Medications Ordered in UC Medications - No data to display  Initial Impression / Assessment and Plan / UC Course  I have reviewed the triage vital signs and the nursing notes.  Pertinent labs & imaging results that were available during my care of the patient were reviewed by me and considered in my medical decision making (see chart for details).     Low suspicion for tick borne disease, shingles. ?irritant dermatitis though no obvious exposure. Will treat symptomatically with prednisone, triamcinolone ointment. Return precautions given.  Final Clinical Impressions(s) / UC Diagnoses   Final diagnoses:  Rash   ED Prescriptions    Medication Sig Dispense Auth. Provider   predniSONE (DELTASONE) 50  MG tablet Take 1 tablet (50 mg total) by mouth daily with breakfast. 5 tablet Rhylynn Perdomo V, PA-C   triamcinolone (KENALOG) 0.025 % ointment Apply 1 application topically at bedtime. 30 g Belinda Fisher, PA-C     PDMP not reviewed this encounter.   Belinda Fisher, PA-C 01/12/20 1018

## 2020-03-11 ENCOUNTER — Other Ambulatory Visit: Payer: Self-pay | Admitting: Internal Medicine

## 2020-05-31 ENCOUNTER — Encounter: Payer: Self-pay | Admitting: Internal Medicine

## 2020-05-31 ENCOUNTER — Ambulatory Visit (INDEPENDENT_AMBULATORY_CARE_PROVIDER_SITE_OTHER): Payer: Medicare PPO | Admitting: Internal Medicine

## 2020-05-31 ENCOUNTER — Other Ambulatory Visit: Payer: Self-pay

## 2020-05-31 VITALS — BP 135/74 | HR 88 | Ht 61.0 in | Wt 155.4 lb

## 2020-05-31 DIAGNOSIS — E785 Hyperlipidemia, unspecified: Secondary | ICD-10-CM | POA: Diagnosis not present

## 2020-05-31 DIAGNOSIS — I10 Essential (primary) hypertension: Secondary | ICD-10-CM | POA: Diagnosis not present

## 2020-05-31 DIAGNOSIS — I2510 Atherosclerotic heart disease of native coronary artery without angina pectoris: Secondary | ICD-10-CM

## 2020-05-31 DIAGNOSIS — Q796 Ehlers-Danlos syndrome, unspecified: Secondary | ICD-10-CM | POA: Diagnosis not present

## 2020-05-31 MED ORDER — NITROGLYCERIN 0.4 MG SL SUBL
0.4000 mg | SUBLINGUAL_TABLET | SUBLINGUAL | 3 refills | Status: DC | PRN
Start: 2020-05-31 — End: 2021-02-23

## 2020-05-31 NOTE — Progress Notes (Signed)
Cardiology Office Note:    Date:  05/31/2020   ID:  [name] [name], DOB [birth year], MRN [id number]  PCP:  Myrlene Broker, MD  Cardiologist:  Parke Poisson, MD  Electrophysiologist:  None   Referring MD: Myrlene Broker, *   Chief Complaint/Reason for Referral: Chest pain  History of Present Illness:    [name] [name] is a 64 y.o. female with a history of Ehlers-Danlos syndrome type III, fatty liver, fibromyalgia, hypertension (resolved upon weight loss), hyperlipidemia, prediabetes and history of obesity s/p bariatric surgery in 2017. Symptoms seem most consistent with esophageal spasm after food was stuck briefly in her esophagus by subjective report on hospital presentation. Due to CAC on CTPE, Q waves on ECG and risk factors, we determined in shared decision making to perform CCTA as an outpatient, showing moderate, nonobstructive CAD. We discussed medical management of CAD again today.   She has continued to have episodes of squeezing in her chest, and food will get stuck. We discussed this seems most consistent with esophageal spasm, but cannot exclude microvascular dysfunction or coronary spasm.  The patient denies dyspnea at rest or with exertion, palpitations, PND, orthopnea, or leg swelling. Denies cough, fever, chills. Denies nausea, vomiting. Denies syncope or presyncope. Denies dizziness or lightheadedness.   Past Medical History:  Diagnosis Date  . Allergy    seasonal  . Anxiety   . Arthritis   . Asthma   . Complication of anesthesia    headache after neck surgery  . Ehlers-Danlos syndrome type III   . Fatty liver   . Fibromyalgia   . GERD (gastroesophageal reflux disease)    history of  . Heart murmur   . History of blood in urine   . History of bronchitis   . History of cholelithiasis   . History of kidney stones   . Hyperlipemia   . Hyperparathyroidism (HCC)   . Hypertension    no medication needed since bariatric surgery  . Insomnia    . Leg pain   . Low back pain   . Obese    history of  . Pneumonia    history of   . Pre-diabetes    no since weight loss  . Sleep apnea    improved since weight loss    Past Surgical History:  Procedure Laterality Date  . achilles tendon tibial tendon fusion     4 surgeries  . CERVICAL DISCECTOMY     2010  . CESAREAN SECTION    . CHOLECYSTECTOMY    . COLONOSCOPY    . CYSTOSCOPY WITH RETROGRADE PYELOGRAM, URETEROSCOPY AND STENT PLACEMENT Right 08/01/2017   Procedure: CYSTOSCOPY WITH RETROGRADE PYELOGRAM, URETEROSCOPY AND STENT PLACEMENT;  Surgeon: Sebastian Ache, MD;  Location: [facility];  Service: Urology;  Laterality: Right;  . HOLMIUM LASER APPLICATION Right 08/01/2017   Procedure: HOLMIUM LASER APPLICATION;  Surgeon: Sebastian Ache, MD;  Location: [facility];  Service: Urology;  Laterality: Right;  . JOINT REPLACEMENT Right    partial joint replacement knee  . LAPAROSCOPIC GASTRIC RESTRICTIVE DUODENAL PROCEDURE (DUODENAL SWITCH)     2017  . lower back     2008  . spinal injections    . SPINE SURGERY     L4-L5, C6-C7     Current Medications: Current Meds  Medication Sig  . albuterol (VENTOLIN HFA) 108 (90 Base) MCG/ACT inhaler Inhale 2 puffs into the lungs every 6 (six) hours as needed for wheezing.  Marland Kitchen ALPRAZolam (  XANAX) 0.5 MG tablet TAKE 1 TABLET BY MOUTH 2 TIMES DAILY AS NEEDED FOR ANXIETY  . amLODipine (NORVASC) 10 MG tablet Take 1 tablet (10 mg total) by mouth daily.  Marland Kitchen. aspirin EC 81 MG tablet Take 1 tablet (81 mg total) by mouth daily. Swallow whole.  Marland Kitchen. atorvastatin (LIPITOR) 40 MG tablet Take 1 tablet (40 mg total) by mouth daily.  . fexofenadine (ALLEGRA) 180 MG tablet Take 180 mg by mouth daily.  . fluticasone (FLONASE) 50 MCG/ACT nasal spray Place 1 spray into both nostrils daily.   . metoprolol tartrate (LOPRESSOR) 50 MG tablet Take 2 hours prior to procedure  . Multiple Vitamins-Minerals (BARIATRIC MULTIVITAMINS/IRON PO)  Take 3 tablets by mouth daily.   . potassium chloride SA (KLOR-CON) 20 MEQ tablet Take 1 tablet (20 mEq total) by mouth daily.  . predniSONE (DELTASONE) 50 MG tablet Take 1 tablet (50 mg total) by mouth daily with breakfast.  . promethazine (PHENERGAN) 25 MG tablet Take 1 tablet (25 mg total) by mouth every 8 (eight) hours as needed for nausea or vomiting.  . solifenacin (VESICARE) 10 MG tablet Take 1 tablet (10 mg total) by mouth daily.  Marland Kitchen. triamcinolone (KENALOG) 0.025 % ointment Apply 1 application topically at bedtime.  Marland Kitchen. venlafaxine XR (EFFEXOR-XR) 150 MG 24 hr capsule TAKE 1 CAPSULE BY MOUTH DAILY.     Allergies:   Patient has no known allergies.   Social History   Tobacco Use  . Smoking status: Never Smoker  . Smokeless tobacco: Never Used  Vaping Use  . Vaping Use: Never used  Substance Use Topics  . Alcohol use: Yes    Alcohol/week: 0.0 standard drinks    Comment: rare  . Drug use: No     Family History: The patient's family history includes Healthy in her daughter; Heart disease in her father; Pancreatic cancer in her sister; Parkinson's disease in her mother. There is no history of Colon cancer, Colon polyps, Esophageal cancer, Rectal cancer, or Stomach cancer.  ROS:   Please see the history of present illness.    All other systems reviewed and are negative.  EKGs/Labs/Other Studies Reviewed:    The following studies were reviewed today:  EKG:  NSR, inferior and anterior infarct pattern.   Recent Labs: 11/15/2019: ALT 21; BUN 9; Creatinine, Ser 0.63; Hemoglobin 11.6; Magnesium 2.1; Platelets 347; Potassium 3.6; Sodium 145  Recent Lipid Panel    Component Value Date/Time   CHOL 174 11/17/2018 1053   TRIG 105.0 11/17/2018 1053   HDL 64.90 11/17/2018 1053   CHOLHDL 3 11/17/2018 1053   VLDL 21.0 11/17/2018 1053   LDLCALC 88 11/17/2018 1053    Physical Exam:    VS:  BP 135/74   Pulse 88   Ht 5\' 1"  (1.549 m)   Wt 155 lb 6.4 oz (70.5 kg)   SpO2 100%   BMI  29.36 kg/m     Wt Readings from Last 5 Encounters:  05/31/20 155 lb 6.4 oz (70.5 kg)  12/09/19 144 lb 6.4 oz (65.5 kg)  11/18/19 151 lb 6.4 oz (68.7 kg)  11/15/19 146 lb 12.8 oz (66.6 kg)  11/08/19 147 lb 6.4 oz (66.9 kg)    Constitutional: No acute distress Eyes: sclera non-icteric, normal conjunctiva and lids ENMT: normal dentition, moist mucous membranes Cardiovascular: regular rhythm, normal rate, no murmurs. S1 and S2 normal. Radial pulses normal bilaterally. No jugular venous distention.  Respiratory: clear to auscultation bilaterally GI : normal bowel sounds, soft and nontender. No  distention.   MSK: extremities warm, well perfused. No edema.  NEURO: grossly nonfocal exam, moves all extremities. PSYCH: alert and oriented x 3, normal mood and affect.   ASSESSMENT:    1. Coronary artery disease involving native coronary artery of native heart without angina pectoris   2. Essential hypertension   3. Hyperlipidemia, unspecified hyperlipidemia type   4. Ehlers-Danlos syndrome    PLAN:    Coronary artery disease involving native coronary artery of native heart without angina pectoris - Plan: EKG 12-Lead - continue ASA 81 mg daily and statin - will trial sublingual nitro for chest pain, likely esophageal spasm.  Essential hypertension - Plan: EKG 12-Lead -BP stable, continue amlodipine 10 mg daily  HLD - continue atorvastatin 40 mg daily. Overdue for labs, will obtain this week.   Ehlers-Danlos syndrome - no vascular ectasias noted in visualized portions of CCTA. Will follow.    Total time of encounter: 30 minutes total time of encounter, including 20 minutes spent in face-to-face patient care on the date of this encounter. This time includes coordination of care and counseling regarding above mentioned problem list. Remainder of non-face-to-face time involved reviewing chart documents/testing relevant to the patient encounter and documentation in the medical record. I have  independently reviewed documentation from referring provider.   Weston Brass, MD [place]  [facility]    Medication Adjustments/Labs and Tests Ordered: Current medicines are reviewed at length with the patient today.  Concerns regarding medicines are outlined above.   Orders Placed This Encounter  Procedures  . EKG 12-Lead    Meds ordered this encounter  Medications  . nitroGLYCERIN (NITROSTAT) 0.4 MG SL tablet    Sig: Place 1 tablet (0.4 mg total) under the tongue every 5 (five) minutes as needed for chest pain.    Dispense:  25 tablet    Refill:  3    Patient Instructions  Medication Instructions:  NITROGLYCERIN- 0.4mg  Sublingual- please see information sheet at the bottom of this page.  *If you need a refill on your cardiac medications before your next appointment, please call your pharmacy*  Lab Work: CMP AND LIPIDS- PLEASE RETURN TO HAVE THIS DONE. YOU WILL NEED TO BE FASTING.  If you have labs (blood work) drawn today and your tests are completely normal, you will receive your results only by: Marland Kitchen MyChart Message (if you have MyChart) OR . A paper copy in the mail If you have any lab test that is abnormal or we need to change your treatment, we will call you to review the results.  Follow-Up: At [facility], you and your health needs are our priority.  As part of our continuing mission to provide you with exceptional heart care, we have created designated Provider Care Teams.  These Care Teams include your primary Cardiologist (physician) and Advanced Practice Providers (APPs -  Physician Assistants and Nurse Practitioners) who all work together to provide you with the care you need, when you need it.  We recommend signing up for the patient portal called "MyChart".  Sign up information is provided on this After Visit Summary.  MyChart is used to connect with patients for Virtual Visits (Telemedicine).  Patients are able to view lab/test results, encounter  notes, upcoming appointments, etc.  Non-urgent messages can be sent to your provider as well.   To learn more about what you can do with MyChart, go to [email or web address].    Your next appointment:   6 month(s)  The format for your next  appointment:   In Person  Provider:   Weston Brass, MD  Other Instructions Nitroglycerin sublingual tablets What is this medicine? NITROGLYCERIN (nye troe GLI ser in) is a type of vasodilator. It relaxes blood vessels, increasing the blood and oxygen supply to your heart. This medicine is used to relieve chest pain caused by angina. It is also used to prevent chest pain before activities like climbing stairs, going outdoors in cold weather, or sexual activity. This medicine may be used for other purposes; ask your health care provider or pharmacist if you have questions. COMMON BRAND NAME(S): Nitroquick, Nitrostat, Nitrotab What should I tell my health care provider before I take this medicine? They need to know if you have any of these conditions:  anemia  head injury, recent stroke, or bleeding in the brain  liver disease  previous heart attack  an unusual or allergic reaction to nitroglycerin, other medicines, foods, dyes, or preservatives  pregnant or trying to get pregnant  breast-feeding How should I use this medicine? Take this medicine by mouth as needed. At the first sign of an angina attack (chest pain or tightness) place one tablet under your tongue. You can also take this medicine 5 to 10 minutes before an event likely to produce chest pain. Follow the directions on the prescription label. Let the tablet dissolve under the tongue. Do not swallow whole. Replace the dose if you accidentally swallow it. It will help if your mouth is not dry. Saliva around the tablet will help it to dissolve more quickly. Do not eat or drink, smoke or chew tobacco while a tablet is dissolving. If you are not better within 5 minutes after taking ONE  dose of nitroglycerin, call 9-1-1 immediately to seek emergency medical care. Do not take more than 3 nitroglycerin tablets over 15 minutes. If you take this medicine often to relieve symptoms of angina, your doctor or health care professional may provide you with different instructions to manage your symptoms. If symptoms do not go away after following these instructions, it is important to call 9-1-1 immediately. Do not take more than 3 nitroglycerin tablets over 15 minutes. Talk to your pediatrician regarding the use of this medicine in children. Special care may be needed. Overdosage: If you think you have taken too much of this medicine contact a poison control center or emergency room at once. NOTE: This medicine is only for you. Do not share this medicine with others. What if I miss a dose? This does not apply. This medicine is only used as needed. What may interact with this medicine? Do not take this medicine with any of the following medications:  certain migraine medicines like ergotamine and dihydroergotamine (DHE)  medicines used to treat erectile dysfunction like sildenafil, tadalafil, and vardenafil  riociguat This medicine may also interact with the following medications:  alteplase  aspirin  heparin  medicines for high blood pressure  medicines for mental depression  other medicines used to treat angina  phenothiazines like chlorpromazine, mesoridazine, prochlorperazine, thioridazine This list may not describe all possible interactions. Give your health care provider a list of all the medicines, herbs, non-prescription drugs, or dietary supplements you use. Also tell them if you smoke, drink alcohol, or use illegal drugs. Some items may interact with your medicine. What should I watch for while using this medicine? Tell your doctor or health care professional if you feel your medicine is no longer working. Keep this medicine with you at all times. Sit or lie down  when you take your medicine to prevent falling if you feel dizzy or faint after using it. Try to remain calm. This will help you to feel better faster. If you feel dizzy, take several deep breaths and lie down with your feet propped up, or bend forward with your head resting between your knees. You may get drowsy or dizzy. Do not drive, use machinery, or do anything that needs mental alertness until you know how this drug affects you. Do not stand or sit up quickly, especially if you are an older patient. This reduces the risk of dizzy or fainting spells. Alcohol can make you more drowsy and dizzy. Avoid alcoholic drinks. Do not treat yourself for coughs, colds, or pain while you are taking this medicine without asking your doctor or health care professional for advice. Some ingredients may increase your blood pressure. What side effects may I notice from receiving this medicine? Side effects that you should report to your doctor or health care professional as soon as possible:  blurred vision  dry mouth  skin rash  sweating  the feeling of extreme pressure in the head  unusually weak or tired Side effects that usually do not require medical attention (report to your doctor or health care professional if they continue or are bothersome):  flushing of the face or neck  headache  irregular heartbeat, palpitations  nausea, vomiting This list may not describe all possible side effects. Call your doctor for medical advice about side effects. You may report side effects to FDA at 1-800-FDA-1088. Where should I keep my medicine? Keep out of the reach of children. Store at room temperature between 20 and 25 degrees C (68 and 77 degrees F). Store in Retail buyer. Protect from light and moisture. Keep tightly closed. Throw away any unused medicine after the expiration date. NOTE: This sheet is a summary. It may not cover all possible information. If you have questions about this medicine,  talk to your doctor, pharmacist, or health care provider.  2020 Elsevier/Gold Standard (2013-04-01 17:57:36)

## 2020-05-31 NOTE — Patient Instructions (Signed)
Medication Instructions:  NITROGLYCERIN- 0.4mg  Sublingual- please see information sheet at the bottom of this page.  *If you need a refill on your cardiac medications before your next appointment, please call your pharmacy*  Lab Work: CMP AND LIPIDS- PLEASE RETURN TO HAVE THIS DONE. YOU WILL NEED TO BE FASTING.  If you have labs (blood work) drawn today and your tests are completely normal, you will receive your results only by: Marland Kitchen MyChart Message (if you have MyChart) OR . A paper copy in the mail If you have any lab test that is abnormal or we need to change your treatment, we will call you to review the results.  Follow-Up: At [facility], you and your health needs are our priority.  As part of our continuing mission to provide you with exceptional heart care, we have created designated Provider Care Teams.  These Care Teams include your primary Cardiologist (physician) and Advanced Practice Providers (APPs -  Physician Assistants and Nurse Practitioners) who all work together to provide you with the care you need, when you need it.  We recommend signing up for the patient portal called "MyChart".  Sign up information is provided on this After Visit Summary.  MyChart is used to connect with patients for Virtual Visits (Telemedicine).  Patients are able to view lab/test results, encounter notes, upcoming appointments, etc.  Non-urgent messages can be sent to your provider as well.   To learn more about what you can do with MyChart, go to [email or web address].    Your next appointment:   6 month(s)  The format for your next appointment:   In Person  Provider:   Weston Brass, MD  Other Instructions Nitroglycerin sublingual tablets What is this medicine? NITROGLYCERIN (nye troe GLI ser in) is a type of vasodilator. It relaxes blood vessels, increasing the blood and oxygen supply to your heart. This medicine is used to relieve chest pain caused by angina. It is also used to prevent  chest pain before activities like climbing stairs, going outdoors in cold weather, or sexual activity. This medicine may be used for other purposes; ask your health care provider or pharmacist if you have questions. COMMON BRAND NAME(S): Nitroquick, Nitrostat, Nitrotab What should I tell my health care provider before I take this medicine? They need to know if you have any of these conditions:  anemia  head injury, recent stroke, or bleeding in the brain  liver disease  previous heart attack  an unusual or allergic reaction to nitroglycerin, other medicines, foods, dyes, or preservatives  pregnant or trying to get pregnant  breast-feeding How should I use this medicine? Take this medicine by mouth as needed. At the first sign of an angina attack (chest pain or tightness) place one tablet under your tongue. You can also take this medicine 5 to 10 minutes before an event likely to produce chest pain. Follow the directions on the prescription label. Let the tablet dissolve under the tongue. Do not swallow whole. Replace the dose if you accidentally swallow it. It will help if your mouth is not dry. Saliva around the tablet will help it to dissolve more quickly. Do not eat or drink, smoke or chew tobacco while a tablet is dissolving. If you are not better within 5 minutes after taking ONE dose of nitroglycerin, call 9-1-1 immediately to seek emergency medical care. Do not take more than 3 nitroglycerin tablets over 15 minutes. If you take this medicine often to relieve symptoms of angina, your doctor or health  care professional may provide you with different instructions to manage your symptoms. If symptoms do not go away after following these instructions, it is important to call 9-1-1 immediately. Do not take more than 3 nitroglycerin tablets over 15 minutes. Talk to your pediatrician regarding the use of this medicine in children. Special care may be needed. Overdosage: If you think you have  taken too much of this medicine contact a poison control center or emergency room at once. NOTE: This medicine is only for you. Do not share this medicine with others. What if I miss a dose? This does not apply. This medicine is only used as needed. What may interact with this medicine? Do not take this medicine with any of the following medications:  certain migraine medicines like ergotamine and dihydroergotamine (DHE)  medicines used to treat erectile dysfunction like sildenafil, tadalafil, and vardenafil  riociguat This medicine may also interact with the following medications:  alteplase  aspirin  heparin  medicines for high blood pressure  medicines for mental depression  other medicines used to treat angina  phenothiazines like chlorpromazine, mesoridazine, prochlorperazine, thioridazine This list may not describe all possible interactions. Give your health care provider a list of all the medicines, herbs, non-prescription drugs, or dietary supplements you use. Also tell them if you smoke, drink alcohol, or use illegal drugs. Some items may interact with your medicine. What should I watch for while using this medicine? Tell your doctor or health care professional if you feel your medicine is no longer working. Keep this medicine with you at all times. Sit or lie down when you take your medicine to prevent falling if you feel dizzy or faint after using it. Try to remain calm. This will help you to feel better faster. If you feel dizzy, take several deep breaths and lie down with your feet propped up, or bend forward with your head resting between your knees. You may get drowsy or dizzy. Do not drive, use machinery, or do anything that needs mental alertness until you know how this drug affects you. Do not stand or sit up quickly, especially if you are an older patient. This reduces the risk of dizzy or fainting spells. Alcohol can make you more drowsy and dizzy. Avoid alcoholic  drinks. Do not treat yourself for coughs, colds, or pain while you are taking this medicine without asking your doctor or health care professional for advice. Some ingredients may increase your blood pressure. What side effects may I notice from receiving this medicine? Side effects that you should report to your doctor or health care professional as soon as possible:  blurred vision  dry mouth  skin rash  sweating  the feeling of extreme pressure in the head  unusually weak or tired Side effects that usually do not require medical attention (report to your doctor or health care professional if they continue or are bothersome):  flushing of the face or neck  headache  irregular heartbeat, palpitations  nausea, vomiting This list may not describe all possible side effects. Call your doctor for medical advice about side effects. You may report side effects to FDA at 1-800-FDA-1088. Where should I keep my medicine? Keep out of the reach of children. Store at room temperature between 20 and 25 degrees C (68 and 77 degrees F). Store in Retail buyer. Protect from light and moisture. Keep tightly closed. Throw away any unused medicine after the expiration date. NOTE: This sheet is a summary. It may not cover all  possible information. If you have questions about this medicine, talk to your doctor, pharmacist, or health care provider.  2020 Elsevier/Gold Standard (2013-04-01 17:57:36)

## 2020-07-04 ENCOUNTER — Other Ambulatory Visit: Payer: Self-pay | Admitting: Podiatrist

## 2020-07-04 ENCOUNTER — Other Ambulatory Visit: Payer: Self-pay | Admitting: Obstetrics and Gynecology

## 2020-07-04 DIAGNOSIS — Z1231 Encounter for screening mammogram for malignant neoplasm of breast: Secondary | ICD-10-CM

## 2020-07-05 ENCOUNTER — Ambulatory Visit
Admission: RE | Admit: 2020-07-05 | Discharge: 2020-07-05 | Disposition: A | Discharge status: Home / Self Care | Payer: Medicare PPO | Source: Ambulatory Visit | Attending: Obstetrics and Gynecology | Admitting: Obstetrics and Gynecology

## 2020-07-05 ENCOUNTER — Other Ambulatory Visit: Payer: Self-pay

## 2020-07-05 DIAGNOSIS — Z1231 Encounter for screening mammogram for malignant neoplasm of breast: Secondary | ICD-10-CM

## 2020-08-12 ENCOUNTER — Other Ambulatory Visit: Payer: Self-pay | Admitting: Internal Medicine

## 2020-11-01 ENCOUNTER — Other Ambulatory Visit: Payer: Self-pay

## 2020-11-01 ENCOUNTER — Telehealth (INDEPENDENT_AMBULATORY_CARE_PROVIDER_SITE_OTHER): Payer: Medicare PPO | Admitting: Internal Medicine

## 2020-11-01 ENCOUNTER — Encounter: Payer: Self-pay | Admitting: Internal Medicine

## 2020-11-01 DIAGNOSIS — J4520 Mild intermittent asthma, uncomplicated: Secondary | ICD-10-CM | POA: Diagnosis not present

## 2020-11-01 DIAGNOSIS — U071 COVID-19: Secondary | ICD-10-CM | POA: Diagnosis not present

## 2020-11-01 MED ORDER — PAXLOVID 20 X 150 MG & 10 X 100MG PO TBPK: 3.0000 | ORAL_TABLET | Freq: Two times a day (BID) | ORAL | 0 refills | Status: DC

## 2020-11-01 MED ORDER — GUAIFENESIN-CODEINE 100-10 MG/5ML PO SOLN
5.0000 mL | Freq: Three times a day (TID) | ORAL | 0 refills | Status: DC | PRN
Start: 2020-11-01 — End: 2021-09-13

## 2020-11-01 NOTE — Progress Notes (Signed)
Virtual Visit via Telephone Note  I connected with [name] [name] on 11/01/20 at  3:00 PM EDT by telephone and verified that I am speaking with the correct person using two identifiers.  Location: Patient: Home Provider: [facility]   I discussed the limitations, risks, security and privacy concerns of performing an evaluation and management service by telephone and the availability of in person appointments. I also discussed with the patient that there may be a patient responsible charge related to this service. The patient expressed understanding and agreed to proceed.   History of Present Illness:   Patient is complaining of cold-like symptoms since Monday this week.  She tested positive for COVID on 518.  She is complaining of severe headache, congestion, dry cough.  She has asthma.  She used her albuterol.  Her temperature is 100 today Observations/Objective:  The patient sounds normal on the phone.  Not dyspneic Assessment and Plan:  See plan Follow Up Instructions:    I discussed the assessment and treatment plan with the patient. The patient was provided an opportunity to ask questions and all were answered. The patient agreed with the plan and demonstrated an understanding of the instructions.   The patient was advised to call back or seek an in-person evaluation if the symptoms worsen or if the condition fails to improve as anticipated.  I provided 21 minutes of non-face-to-face time during this encounter.   Sonda Primes, MD

## 2020-11-01 NOTE — Assessment & Plan Note (Signed)
Use Albuterol qid

## 2020-11-01 NOTE — Assessment & Plan Note (Addendum)
[employer] Emailed a prescription for [employer] cough syr as needed severe cough Symptomatic therapy discussed, MyChart message sent.

## 2020-11-02 ENCOUNTER — Telehealth: Payer: Medicare PPO | Admitting: Family Medicine

## 2020-11-20 ENCOUNTER — Ambulatory Visit (INDEPENDENT_AMBULATORY_CARE_PROVIDER_SITE_OTHER): Payer: Medicare PPO | Admitting: Internal Medicine

## 2020-11-20 ENCOUNTER — Encounter: Payer: Self-pay | Admitting: Internal Medicine

## 2020-11-20 ENCOUNTER — Other Ambulatory Visit: Payer: Self-pay

## 2020-11-20 VITALS — BP 136/78 | HR 86 | Temp 98.5°F | Resp 18 | Ht 61.0 in | Wt 165.0 lb

## 2020-11-20 DIAGNOSIS — I10 Essential (primary) hypertension: Secondary | ICD-10-CM

## 2020-11-20 DIAGNOSIS — E782 Mixed hyperlipidemia: Secondary | ICD-10-CM

## 2020-11-20 DIAGNOSIS — Z0000 Encounter for general adult medical examination without abnormal findings: Secondary | ICD-10-CM

## 2020-11-20 DIAGNOSIS — G4700 Insomnia, unspecified: Secondary | ICD-10-CM

## 2020-11-20 DIAGNOSIS — Z23 Encounter for immunization: Secondary | ICD-10-CM | POA: Diagnosis not present

## 2020-11-20 DIAGNOSIS — E213 Hyperparathyroidism, unspecified: Secondary | ICD-10-CM

## 2020-11-20 DIAGNOSIS — M1990 Unspecified osteoarthritis, unspecified site: Secondary | ICD-10-CM

## 2020-11-20 DIAGNOSIS — N3941 Urge incontinence: Secondary | ICD-10-CM

## 2020-11-20 DIAGNOSIS — Q796 Ehlers-Danlos syndrome, unspecified: Secondary | ICD-10-CM

## 2020-11-20 DIAGNOSIS — J4520 Mild intermittent asthma, uncomplicated: Secondary | ICD-10-CM | POA: Diagnosis not present

## 2020-11-20 DIAGNOSIS — E2839 Other primary ovarian failure: Secondary | ICD-10-CM

## 2020-11-20 DIAGNOSIS — Z8709 Personal history of other diseases of the respiratory system: Secondary | ICD-10-CM

## 2020-11-20 LAB — CBC
HCT: 36.3 % (ref 36.0–46.0)
Hemoglobin: 12.4 g/dL (ref 12.0–15.0)
MCHC: 34.1 g/dL (ref 30.0–36.0)
MCV: 86.6 fl (ref 78.0–100.0)
Platelets: 414 10*3/uL — ABNORMAL HIGH (ref 150.0–400.0)
RBC: 4.2 Mil/uL (ref 3.87–5.11)
RDW: 13.5 % (ref 11.5–15.5)
WBC: 5.5 10*3/uL (ref 4.0–10.5)

## 2020-11-20 LAB — HEMOGLOBIN A1C: Hgb A1c MFr Bld: 5.5 % (ref 4.6–6.5)

## 2020-11-20 MED ORDER — ATORVASTATIN CALCIUM 40 MG PO TABS: 40.0000 mg | ORAL_TABLET | Freq: Every day | ORAL | 3 refills | Status: DC

## 2020-11-20 MED ORDER — AMLODIPINE BESYLATE 10 MG PO TABS: 10.0000 mg | ORAL_TABLET | Freq: Every day | ORAL | 3 refills | Status: DC

## 2020-11-20 MED ORDER — ALBUTEROL SULFATE HFA 108 (90 BASE) MCG/ACT IN AERS: 2.0000 | INHALATION_SPRAY | Freq: Four times a day (QID) | RESPIRATORY_TRACT | 3 refills | Status: DC | PRN

## 2020-11-20 MED ORDER — VENLAFAXINE HCL ER 150 MG PO CP24: 150.0000 mg | ORAL_CAPSULE | Freq: Every day | ORAL | 3 refills | Status: DC

## 2020-11-20 MED ORDER — SOLIFENACIN SUCCINATE 10 MG PO TABS: 10.0000 mg | ORAL_TABLET | Freq: Every day | ORAL | 3 refills | Status: DC

## 2020-11-20 NOTE — Patient Instructions (Addendum)
We will get the labs and bone density.   Health Maintenance, Female Adopting a healthy lifestyle and getting preventive care are important in promoting health and wellness. Ask your health care provider about:  The right schedule for you to have regular tests and exams.  Things you can do on your own to prevent diseases and keep yourself healthy. What should I know about diet, weight, and exercise? Eat a healthy diet  Eat a diet that includes plenty of vegetables, fruits, low-fat dairy products, and lean protein.  Do not eat a lot of foods that are high in solid fats, added sugars, or sodium.   Maintain a healthy weight Body mass index (BMI) is used to identify weight problems. It estimates body fat based on height and weight. Your health care provider can help determine your BMI and help you achieve or maintain a healthy weight. Get regular exercise Get regular exercise. This is one of the most important things you can do for your health. Most adults should:  Exercise for at least 150 minutes each week. The exercise should increase your heart rate and make you sweat (moderate-intensity exercise).  Do strengthening exercises at least twice a week. This is in addition to the moderate-intensity exercise.  Spend less time sitting. Even light physical activity can be beneficial. Watch cholesterol and blood lipids Have your blood tested for lipids and cholesterol at 66 years of age, then have this test every 5 years. Have your cholesterol levels checked more often if:  Your lipid or cholesterol levels are high.  You are older than 66 years of age.  You are at high risk for heart disease. What should I know about cancer screening? Depending on your health history and family history, you may need to have cancer screening at various ages. This may include screening for:  Breast cancer.  Cervical cancer.  Colorectal cancer.  Skin cancer.  Lung cancer. What should I know about  heart disease, diabetes, and high blood pressure? Blood pressure and heart disease  High blood pressure causes heart disease and increases the risk of stroke. This is more likely to develop in people who have high blood pressure readings, are of African descent, or are overweight.  Have your blood pressure checked: ? Every 3-5 years if you are 37-76 years of age. ? Every year if you are 36 years old or older. Diabetes Have regular diabetes screenings. This checks your fasting blood sugar level. Have the screening done:  Once every three years after age 41 if you are at a normal weight and have a low risk for diabetes.  More often and at a younger age if you are overweight or have a high risk for diabetes. What should I know about preventing infection? Hepatitis B If you have a higher risk for hepatitis B, you should be screened for this virus. Talk with your health care provider to find out if you are at risk for hepatitis B infection. Hepatitis C Testing is recommended for:  Everyone born from [birth year] through 1966.  Anyone with known risk factors for hepatitis C. Sexually transmitted infections (STIs)  Get screened for STIs, including gonorrhea and chlamydia, if: ? You are sexually active and are younger than 66 years of age. ? You are older than 66 years of age and your health care provider tells you that you are at risk for this type of infection. ? Your sexual activity has changed since you were last screened, and you are at increased  risk for chlamydia or gonorrhea. Ask your health care provider if you are at risk.  Ask your health care provider about whether you are at high risk for HIV. Your health care provider may recommend a prescription medicine to help prevent HIV infection. If you choose to take medicine to prevent HIV, you should first get tested for HIV. You should then be tested every 3 months for as long as you are taking the medicine. Pregnancy  If you are about to  stop having your period (premenopausal) and you may become pregnant, seek counseling before you get pregnant.  Take 400 to 800 micrograms (mcg) of folic acid every day if you become pregnant.  Ask for birth control (contraception) if you want to prevent pregnancy. Osteoporosis and menopause Osteoporosis is a disease in which the bones lose minerals and strength with aging. This can result in bone fractures. If you are 76 years old or older, or if you are at risk for osteoporosis and fractures, ask your health care provider if you should:  Be screened for bone loss.  Take a calcium or vitamin D supplement to lower your risk of fractures.  Be given hormone replacement therapy (HRT) to treat symptoms of menopause. Follow these instructions at home: Lifestyle  Do not use any products that contain nicotine or tobacco, such as cigarettes, e-cigarettes, and chewing tobacco. If you need help quitting, ask your health care provider.  Do not use street drugs.  Do not share needles.  Ask your health care provider for help if you need support or information about quitting drugs. Alcohol use  Do not drink alcohol if: ? Your health care provider tells you not to drink. ? You are pregnant, may be pregnant, or are planning to become pregnant.  If you drink alcohol: ? Limit how much you use to 0-1 drink a day. ? Limit intake if you are breastfeeding.  Be aware of how much alcohol is in your drink. In the [place] one drink equals one 12 oz bottle of beer (355 mL), one 5 oz glass of wine (148 mL), or one 1 oz glass of hard liquor (44 mL). General instructions  Schedule regular health, dental, and eye exams.  Stay current with your vaccines.  Tell your health care provider if: ? You often feel depressed. ? You have ever been abused or do not feel safe at home. Summary  Adopting a healthy lifestyle and getting preventive care are important in promoting health and wellness.  Follow your  health care provider's instructions about healthy diet, exercising, and getting tested or screened for diseases.  Follow your health care provider's instructions on monitoring your cholesterol and blood pressure. This information is not intended to replace advice given to you by your health care provider. Make sure you discuss any questions you have with your health care provider. Document Revised: 05/27/2018 Document Reviewed: 05/27/2018 Elsevier Patient Education  2021 [employer].

## 2020-11-20 NOTE — Progress Notes (Signed)
Subjective:   Patient ID: [name] [name], female    DOB: Oct 08, 1955, 65 y.o.   MRN: [id number]  HPI Here for new to medicare wellness and physical, no new complaints. Please see A/P for status and treatment of chronic medical problems.   Plan to remove potassium if normal on labs.  Diet: heart healthy Physical activity: sedentary Depression/mood screen: negative Hearing: intact to whispered voice, no loss detected Visual acuity: grossly normal with lens, performs annual eye exam  ADLs: capable Fall risk: none Home safety: good Cognitive evaluation: intact to orientation, naming, recall and repetition EOL planning: adv directives discussed, in place  [employer] Visit from 11/20/2020 in [facility] at [employer]  PHQ-2 Total Score 0       Hearing Screening   125Hz  250Hz  500Hz  1000Hz  2000Hz  3000Hz  4000Hz  6000Hz  8000Hz   Right ear:           Left ear:             Visual Acuity Screening   Right eye Left eye Both eyes  Without correction:     With correction: 20/15 20/15 20/20     I have personally reviewed and have noted 1. The patient's medical and social history - reviewed today no changes 2. Their use of alcohol, tobacco or illicit drugs 3. Their current medications and supplements 4. The patient's functional ability including ADL's, fall risks, home safety risks and hearing or visual impairment. 5. Diet and physical activities 6. Evidence for depression or mood disorders 7. Care team reviewed and updated 8.  The patient is not on an opioid pain medication.  Patient Care Team: , MD as PCP - General (Internal Medicine) , MD as PCP - Cardiology (Cardiology) Past Medical History:  Diagnosis Date  . Allergy    seasonal  . Anxiety   . Arthritis   . Asthma   . Complication of anesthesia    headache after neck surgery  . Ehlers-Danlos syndrome type III   . Fatty liver   . Fibromyalgia   . GERD  (gastroesophageal reflux disease)    history of  . Heart murmur   . History of blood in urine   . History of bronchitis   . History of cholelithiasis   . History of kidney stones   . Hyperlipemia   . Hyperparathyroidism (HCC)   . Hypertension    no medication needed since bariatric surgery  . Insomnia   . Leg pain   . Low back pain   . Obese    history of  . Pneumonia    history of   . Pre-diabetes    no since weight loss  . Sleep apnea    improved since weight loss   Past Surgical History:  Procedure Laterality Date  . achilles tendon tibial tendon fusion     4 surgeries  . BREAST BIOPSY Right   . BREAST BIOPSY Left   . BREAST BIOPSY Left   . CERVICAL DISCECTOMY     2010  . CESAREAN SECTION    . CHOLECYSTECTOMY    . COLONOSCOPY    . CYSTOSCOPY WITH RETROGRADE PYELOGRAM, URETEROSCOPY AND STENT PLACEMENT Right 08/01/2017   Procedure: CYSTOSCOPY WITH RETROGRADE PYELOGRAM, URETEROSCOPY AND STENT PLACEMENT;  Surgeon: , MD;  Location[facility];  Service: Urology;  Laterality: Right;  . HOLMIUM LASER APPLICATION Right 08/01/2017   Procedure: HOLMIUM LASER APPLICATION;  Surgeon: , MD;  Location: [facility];  Service: Urology;  Laterality: Right;  . JOINT REPLACEMENT Right    partial joint replacement knee  . LAPAROSCOPIC GASTRIC RESTRICTIVE DUODENAL PROCEDURE (DUODENAL SWITCH)     2017  . lower back     2008  . spinal injections    . SPINE SURGERY     L4-L5, C6-C7    Family History  Problem Relation Age of Onset  . Parkinson's disease Mother        Deceased, 24  . Heart disease Father        Deceased, 48  . Pancreatic cancer Sister        Survivor  . Healthy Daughter   . Colon cancer Neg Hx   . Colon polyps Neg Hx   . Esophageal cancer Neg Hx   . Rectal cancer Neg Hx   . Stomach cancer Neg Hx    Review of Systems  Constitutional: Negative.   HENT: Negative.   Eyes: Negative.   Respiratory:  Negative for cough, chest tightness and shortness of breath.   Cardiovascular: Negative for chest pain, palpitations and leg swelling.  Gastrointestinal: Negative for abdominal distention, abdominal pain, constipation, diarrhea, nausea and vomiting.  Musculoskeletal: Positive for arthralgias.  Skin: Negative.   Neurological: Negative.   Psychiatric/Behavioral: Negative.     Objective:  Physical Exam Constitutional:      Appearance: She is well-developed.  HENT:     Head: Normocephalic and atraumatic.  Cardiovascular:     Rate and Rhythm: Normal rate and regular rhythm.  Pulmonary:     Effort: Pulmonary effort is normal. No respiratory distress.     Breath sounds: Normal breath sounds. No wheezing or rales.  Abdominal:     General: Bowel sounds are normal. There is no distension.     Palpations: Abdomen is soft.     Tenderness: There is no abdominal tenderness. There is no rebound.  Musculoskeletal:     Cervical back: Normal range of motion.  Skin:    General: Skin is warm and dry.  Neurological:     Mental Status: She is alert and oriented to person, place, and time.     Coordination: Coordination normal.     Vitals:   11/20/20 0958  BP: 136/78  Pulse: 86  Resp: 18  Temp: 98.5 F (36.9 C)  TempSrc: Oral  SpO2: 98%  Weight: 165 lb (74.8 kg)  Height: 5\' 1"  (1.549 m)   This visit occurred during the SARS-CoV-2 public health emergency.  Safety protocols were in place, including screening questions prior to the visit, additional usage of staff PPE, and extensive cleaning of exam room while observing appropriate contact time as indicated for disinfecting solutions.   Assessment & Plan:  Pneumonia 23 given at visit

## 2020-11-21 ENCOUNTER — Ambulatory Visit: Payer: Medicare PPO | Admitting: Internal Medicine

## 2020-11-21 ENCOUNTER — Encounter: Payer: Self-pay | Admitting: Internal Medicine

## 2020-11-21 LAB — COMPREHENSIVE METABOLIC PANEL
ALT: 28 U/L (ref 0–35)
AST: 29 U/L (ref 0–37)
Albumin: 4.1 g/dL (ref 3.5–5.2)
Alkaline Phosphatase: 129 U/L — ABNORMAL HIGH (ref 39–117)
BUN: 14 mg/dL (ref 6–23)
CO2: 30 mEq/L (ref 19–32)
Calcium: 8.4 mg/dL (ref 8.4–10.5)
Chloride: 108 mEq/L (ref 96–112)
Creatinine, Ser: 0.78 mg/dL (ref 0.40–1.20)
GFR: 79.46 mL/min (ref >60.00)
Glucose, Bld: 61 mg/dL — ABNORMAL LOW (ref 70–99)
Potassium: 3.6 mEq/L (ref 3.5–5.1)
Sodium: 145 mEq/L (ref 135–145)
Total Bilirubin: 0.4 mg/dL (ref 0.2–1.2)
Total Protein: 6.9 g/dL (ref 6.0–8.3)

## 2020-11-21 LAB — LIPID PANEL
Cholesterol: 142 mg/dL (ref 0–200)
HDL: 65.3 mg/dL (ref >39.00)
LDL Cholesterol: 62 mg/dL (ref 0–99)
NonHDL: 76.97
Total CHOL/HDL Ratio: 2
Triglycerides: 74 mg/dL (ref 0.0–149.0)
VLDL: 14.8 mg/dL (ref 0.0–40.0)

## 2020-11-21 NOTE — Assessment & Plan Note (Signed)
Taking vesicare and overall satisfied with control.

## 2020-11-21 NOTE — Assessment & Plan Note (Signed)
Effexor helping some with pain.

## 2020-11-21 NOTE — Assessment & Plan Note (Addendum)
Stable and no concerns today. Taking effexor for some joint pain which seems to help.

## 2020-11-21 NOTE — Assessment & Plan Note (Signed)
Checking lipid panel. Taking lipitor 40 mg daily. Adjust as needed.

## 2020-11-21 NOTE — Assessment & Plan Note (Signed)
BP at goal on amlodipine 10 mg daily. Previously had been on other medication and needed potassium. She is still taking this. If potassium normal on labs today will remove and advise her to stop taking this. Checking CMP today.

## 2020-11-21 NOTE — Assessment & Plan Note (Signed)
Uses alprazolam for sleeping prn and no concerns. Can refill as needed.

## 2020-11-21 NOTE — Assessment & Plan Note (Signed)
Flu shot yearly. Covid-19 2 vaccines and up to date on booster although we do not have record of those. Pneumonia 23 given today. Shingrix complete. Tetanus due declines. Colonoscopy due 2030. Mammogram due 2024, pap smear aged out further and dexa ordered today. Counseled about sun safety and mole surveillance. Counseled about the dangers of distracted driving. Given 10 year screening recommendations.

## 2020-11-21 NOTE — Assessment & Plan Note (Signed)
Albuterol prn and allergy medication. No flare today.

## 2020-11-21 NOTE — Assessment & Plan Note (Signed)
Ordered follow up DEXA today although she has not had any elevated calcium levels. She did have an elevated PTH years ago.

## 2020-12-08 ENCOUNTER — Other Ambulatory Visit: Payer: Self-pay | Admitting: Internal Medicine

## 2020-12-17 NOTE — Progress Notes (Deleted)
Cardiology Office Note:    Date:  12/17/2020   ID:  [name] [name], DOB 09-02-1955, MRN [id number]  PCP:  Myrlene Broker, MD  Cardiologist:  Parke Poisson, MD  Electrophysiologist:  None   Referring MD: Myrlene Broker, *   Chief Complaint: follow-up of non-obstructive CAD  History of Present Illness:    [name] [name] is a 65 y.o. female with a history of moderate non-obstructive CAD noted on coronary CTA in 11/2019, hypertension, hyperlipidemia, prediabetes, hyperparathyroidism, Ehlers-Danlos syndrome type III, fatty liver, fibromyalgia, obesity s/p bariatric surgery in 2017 who is followed by Dr. Jacques Navy and presents today for routine follow-up.   Patient was seen remotely by Cardiology for evaluation of palpitations and reportedly had a Echo and stress test that were normal at that time. This was over 15 years ago. She then had another stress test at [facility] in 2017 prior to bariatric surgery which was again reportedly normal (unable to personally see report of this). Following bariatric surgery, patient lost a lot of weight and was able to come off medications for hypertension and hyperlpidemia. Patient was first seen by Dr. Jacques Navy during an admission in 10/2019 for evaluation of chest pain radiating to jaw following multiple episodes of emesis. Troponin was negative. D-dimer was elevated. Chest CTA was negative for PE but did show some mild coronary calcifications. She also had some inferior Q waves noted on EKG. Therefore, outpatient coronary CTA was recommended. Coronary CTA in 11/2019 showed coronary calcium score of 62 with moderate CAD in the proximal LAD. FFR was negative with no significant stenosis. Patient was last seen by Dr. Jacques Navy in 05/2020 at which time she continued to report some episodes of "squeezing" in her chest and stated "food will get stuck." Symptoms were felt to likely be due to esophageal spasms. However, she was prescribed a trial of sublingual  Nitro to see if this would help.   Patient presents today for follow-up. ***  Nonobstructive CAD - Moderated non-obstructive CAD involving proximal LAD noted on coronary CTA in 11/2019. FFR was negative.  - No angina. *** - Continue aspirin and high-intensity statin.  Hypertension - *** - Continue Amlodipine 10mg  daily.  Hyperlipidemia - Recent lipid panel on 11/20/2020: Total Cholesterol 142, Triglycerides 74, HDL 65.3, LDL 62.  - At LDL goal of <70 given CAD. - Continue Lipitor 40mg  daily. - Labs followed by PCP.  Prediabetes  - Recent hemoglobin A1c on 11/20/2020 was 5.5.  - Followed by PCP.  Ehlers-Danlos Syndrome (Type III) - No vascular ectasias noted on coronary CTA in 11/2019.  - Followed by PCP.  Past Medical History:  Diagnosis Date   Allergy    seasonal   Anxiety    Arthritis    Asthma    Complication of anesthesia    headache after neck surgery   Ehlers-Danlos syndrome type III    Fatty liver    Fibromyalgia    GERD (gastroesophageal reflux disease)    history of   Heart murmur    History of blood in urine    History of bronchitis    History of cholelithiasis    History of kidney stones    Hyperlipemia    Hyperparathyroidism (HCC)    Hypertension    no medication needed since bariatric surgery   Insomnia    Leg pain    Low back pain    Obese    history of   Pneumonia    history of  Pre-diabetes    no since weight loss   Sleep apnea    improved since weight loss    Past Surgical History:  Procedure Laterality Date   achilles tendon tibial tendon fusion     4 surgeries   BREAST BIOPSY Right    BREAST BIOPSY Left    BREAST BIOPSY Left    CERVICAL DISCECTOMY     2010   CESAREAN SECTION     CHOLECYSTECTOMY     COLONOSCOPY     CYSTOSCOPY WITH RETROGRADE PYELOGRAM, URETEROSCOPY AND STENT PLACEMENT Right 08/01/2017   Procedure: CYSTOSCOPY WITH RETROGRADE PYELOGRAM, URETEROSCOPY AND STENT PLACEMENT;  Surgeon: Sebastian Ache, MD;  Location:  [facility];  Service: Urology;  Laterality: Right;   HOLMIUM LASER APPLICATION Right 08/01/2017   Procedure: HOLMIUM LASER APPLICATION;  Surgeon: Sebastian Ache, MD;  Location: [facility];  Service: Urology;  Laterality: Right;   JOINT REPLACEMENT Right    partial joint replacement knee   LAPAROSCOPIC GASTRIC RESTRICTIVE DUODENAL PROCEDURE (DUODENAL SWITCH)     2017   lower back     2008   spinal injections     SPINE SURGERY     L4-L5, C6-C7     Current Medications: No outpatient medications have been marked as taking for the 12/25/20 encounter (Appointment) with Corrin Parker, PA-C.     Allergies:   Patient has no known allergies.   Social History   Socioeconomic History   Marital status: Married    Spouse name: Not on file   Number of children: Not on file   Years of education: Not on file   Highest education level: Not on file  Occupational History   Not on file  Tobacco Use   Smoking status: Never   Smokeless tobacco: Never  Vaping Use   Vaping Use: Never used  Substance and Sexual Activity   Alcohol use: Yes    Alcohol/week: 0.0 standard drinks    Comment: rare   Drug use: No   Sexual activity: Not on file  Other Topics Concern   Not on file  Social History Narrative   Lives with husband in a 2 story home.  Has 1 daughter.     On disability in 2009.  Used to work as an Magazine features editor at [employer]      Social Determinants of Health   Financial Resource Strain: Not on file  Food Insecurity: Not on file  Transportation Needs: Not on file  Physical Activity: Not on file  Stress: Not on file  Social Connections: Not on file     Family History: The patient's family history includes Healthy in her daughter; Heart disease in her father; Pancreatic cancer in her sister; Parkinson's disease in her mother. There is no history of Colon cancer, Colon polyps, Esophageal cancer, Rectal cancer, or Stomach cancer.  ROS:   Please see  the history of present illness.     EKGs/Labs/Other Studies Reviewed:    The following studies were reviewed today:  Coronary CTA 12/08/2019: Impressions: 1. Moderate CAD in proximal LAD, CADRADS = 3. CT FFR will be performed and reported separately. 2. Coronary calcium score is 62, which places the patient in the 78th percentile for age and sex matched control. 3. Normal coronary origin with right dominance.  CT FFR analysis showed no significant stenosis.  Recommendations: Goal directed medical therapy and aggressive risk factor modification for secondary prevention of coronary artery disease.  EKG:  EKG ordered today. EKG personally  reviewed and demonstrates ***.  Recent Labs: 11/20/2020: ALT 28; BUN 14; Creatinine, Ser 0.78; Hemoglobin 12.4; Platelets 414.0; Potassium 3.6; Sodium 145  Recent Lipid Panel    Component Value Date/Time   CHOL 142 11/20/2020 1041   TRIG 74.0 11/20/2020 1041   HDL 65.30 11/20/2020 1041   CHOLHDL 2 11/20/2020 1041   VLDL 14.8 11/20/2020 1041   LDLCALC 62 11/20/2020 1041    Physical Exam:    Vital Signs: There were no vitals taken for this visit.    Wt Readings from Last 3 Encounters:  11/20/20 165 lb (74.8 kg)  05/31/20 155 lb 6.4 oz (70.5 kg)  12/09/19 144 lb 6.4 oz (65.5 kg)     General: 66 y.o. female in no acute distress. HEENT: Normocephalic and atraumatic. Sclera clear. EOMs intact. Neck: Supple. No carotid bruits. No JVD. Heart: *** RRR. Distinct S1 and S2. No murmurs, gallops, or rubs. Radial and distal pedal pulses 2+ and equal bilaterally. Lungs: No increased work of breathing. Clear to ausculation bilaterally. No wheezes, rhonchi, or rales.  Abdomen: Soft, non-distended, and non-tender to palpation. Bowel sounds present in all 4 quadrants.  MSK: Normal strength and tone for age. *** Extremities: No lower extremity edema.    Skin: Warm and dry. Neuro: Alert and oriented x3. No focal deficits. Psych: Normal affect. Responds  appropriately.   Assessment:    No diagnosis found.  Plan:     Disposition: Follow up in ***   Medication Adjustments/Labs and Tests Ordered: Current medicines are reviewed at length with the patient today.  Concerns regarding medicines are outlined above.  No orders of the defined types were placed in this encounter.  No orders of the defined types were placed in this encounter.   There are no Patient Instructions on file for this visit.   Signed, Corrin Parker, PA-C  12/17/2020 12:49 PM    [facility]

## 2020-12-25 ENCOUNTER — Ambulatory Visit: Payer: Medicare PPO | Admitting: Student

## 2020-12-25 DIAGNOSIS — S82831D Other fracture of upper and lower end of right fibula, subsequent encounter for closed fracture with routine healing: Secondary | ICD-10-CM | POA: Diagnosis not present

## 2020-12-25 DIAGNOSIS — S8292XD Unspecified fracture of left lower leg, subsequent encounter for closed fracture with routine healing: Secondary | ICD-10-CM | POA: Diagnosis not present

## 2021-02-08 DIAGNOSIS — F900 Attention-deficit hyperactivity disorder, predominantly inattentive type: Secondary | ICD-10-CM | POA: Diagnosis not present

## 2021-02-13 DIAGNOSIS — F900 Attention-deficit hyperactivity disorder, predominantly inattentive type: Secondary | ICD-10-CM | POA: Diagnosis not present

## 2021-02-17 NOTE — Progress Notes (Signed)
Cardiology Office Note:    Date:  02/23/2021   ID:  [name] [name], DOB 09-Apr-1956, MRN [id number]  PCP:  [name] Broker, MD  Cardiologist:  [name] Poisson, MD  Electrophysiologist:  None   Referring MD: [name] [name], *   Chief Complaint: follow-up of CAD  History of Present Illness:    [name] [name] is a 65 y.o. female with a history of  moderate non-obstructive CAD noted on coronary CTA in 11/2019, hypertension, hyperlipidemia, prediabetes, hyperparathyroidism, Ehlers-Danlos syndrome type III, fatty liver, fibromyalgia, obesity s/p bariatric surgery in 2017 who is followed by Dr. Jacques [name] and presents today for routine follow-up.   Patient was seen remotely by Cardiology for evaluation of palpitations and reportedly had a Echo and stress test that were normal at that time. This was over 15 years ago. She then had another stress test at [facility] in 2017 prior to bariatric surgery which was again reportedly normal (unable to personally see report of this). Following bariatric surgery, patient lost a lot of weight and was able to come off medications for hypertension and hyperlpidemia. Patient was first seen by Dr. Jacques [name] during an admission in 10/2019 for evaluation of chest pain radiating to jaw following multiple episodes of emesis. Troponin was negative. D-dimer was elevated. Chest CTA was negative for PE but did show some mild coronary calcifications. She also had some inferior Q waves noted on EKG. Therefore, outpatient coronary CTA was recommended. Coronary CTA in 11/2019 showed coronary calcium score of 62 with moderate CAD in the proximal LAD. FFR was negative with no significant stenosis. Patient was last seen by Dr. Jacques [name] in 05/2020 at which time she continued to report some episodes of "squeezing" in her chest and stated "food will get stuck." Symptoms were felt to likely be due to esophageal spasms. However, she was prescribed a trial of sublingual Nitro to see if  this would help.   Patient presents today for follow-up. Here alone.  Patient overall doing well since last visit.  She did have a break her right ankle in April 2022 and was in a wheelchair for about 3 months.  However from a cardiac standpoint she is doing well.  She continues to note he showed normal.  Atypical chest pain that she describes a "zing" or "zap" across different areas of her chest as well as occasional very brief pain in her left arm.  This will last for couple seconds at a time and ankle is warm.  No chest pain concerning for angina.  She continues to have problems with her esophageal spasms and thinks is becoming worse.  She thinks she may need another esophageal dilation.  She denies any shortness of breath, orthopnea, PND.  She has chronic lower extremity edema bilaterally (right now great than left since ankle injury) but states this is stable.  Her legs are very tender to the touch she is unable to wear compression stockings but elevate lower leg helps.  He states she has a long history of tachycardia and intermittent feels her heart racing but this is stable. She states she previously has worn Holter Monitor several years ago which was unremarkable.  No lightheadedness, dizziness, syncope.  Past Medical History:  Diagnosis Date   Allergy    seasonal   Anxiety    Arthritis    Asthma    Complication of anesthesia    headache after neck surgery   Ehlers-Danlos syndrome type III    Fatty liver  Fibromyalgia    GERD (gastroesophageal reflux disease)    history of   Heart murmur    History of blood in urine    History of bronchitis    History of cholelithiasis    History of kidney stones    Hyperlipemia    Hyperparathyroidism (HCC)    Hypertension    no medication needed since bariatric surgery   Insomnia    Leg pain    Low back pain    Obese    history of   Pneumonia    history of    Pre-diabetes    no since weight loss   Sleep apnea    improved since weight  loss    Past Surgical History:  Procedure Laterality Date   achilles tendon tibial tendon fusion     4 surgeries   BREAST BIOPSY Right    BREAST BIOPSY Left    BREAST BIOPSY Left    CERVICAL DISCECTOMY     2010   CESAREAN SECTION     CHOLECYSTECTOMY     COLONOSCOPY     CYSTOSCOPY WITH RETROGRADE PYELOGRAM, URETEROSCOPY AND STENT PLACEMENT Right 08/01/2017   Procedure: CYSTOSCOPY WITH RETROGRADE PYELOGRAM, URETEROSCOPY AND STENT PLACEMENT;  Surgeon: [name] [name], Theodore, MD;  [name];  Service: Urology;  Laterality: Right;   HOLMIUM LASER APPLICATION Right 08/01/2017   Procedure: HOLMIUM LASER APPLICATION;  Surgeon: [name] [name], Theodore, MD;  [name];  Service: Urology;  Laterality: Right;   JOINT REPLACEMENT Right    partial joint replacement knee   LAPAROSCOPIC GASTRIC RESTRICTIVE DUODENAL PROCEDURE (DUODENAL SWITCH)     2017   lower back     2008   spinal injections     SPINE SURGERY     L4-L5, C6-C7     Current Medications: Current Meds  Medication Sig   albuterol (VENTOLIN HFA) 108 (90 Base) MCG/ACT inhaler Inhale 2 puffs into the lungs every 6 (six) hours as needed for wheezing.   ALPRAZolam (XANAX) 0.5 MG tablet TAKE 1 TABLET BY MOUTH 2 TIMES DAILY AS NEEDED FOR ANXIETY   amLODipine (NORVASC) 10 MG tablet Take 1 tablet (10 mg total) by mouth daily.   atorvastatin (LIPITOR) 40 MG tablet Take 1 tablet (40 mg total) by mouth daily.   fexofenadine (ALLEGRA) 180 MG tablet Take 180 mg by mouth daily.   fluticasone (FLONASE) 50 MCG/ACT nasal spray Place 1 spray into both nostrils daily.    Multiple Vitamins-Minerals (BARIATRIC MULTIVITAMINS/IRON PO) Take 3 tablets by mouth daily.    promethazine (PHENERGAN) 25 MG tablet Take 1 tablet (25 mg total) by mouth every 8 (eight) hours as needed for nausea or vomiting.   solifenacin (VESICARE) 10 MG tablet Take 1 tablet (10 mg total) by mouth daily.   venlafaxine XR (EFFEXOR-XR) 150 MG 24  hr capsule Take 1 capsule (150 mg total) by mouth daily.     Allergies:   Patient has no known allergies.   Social History   Socioeconomic History   Marital status: Married    Spouse name: Not on file   Number of children: Not on file   Years of education: Not on file   Highest education level: Not on file  Occupational History   Not on file  Tobacco Use   Smoking status: Never   Smokeless tobacco: Never  Vaping Use   Vaping Use: Never used  Substance and Sexual Activity   Alcohol use: Yes    Alcohol/week: 0.0 standard  drinks    Comment: rare   Drug use: No   Sexual activity: Not on file  Other Topics Concern   Not on file  Social History Narrative   Lives with husband in a 2 story home.  Has 1 daughter.     On disability in 2009.  Used to work as an Magazine features editor at [employer]      Social Determinants of Health   Financial Resource Strain: Not on file  Food Insecurity: Not on file  Transportation Needs: Not on file  Physical Activity: Not on file  Stress: Not on file  Social Connections: Not on file     Family History: The patient's family history includes Healthy in her daughter; Heart disease in her father; Pancreatic cancer in her sister; Parkinson's disease in her mother. There is no history of Colon cancer, Colon polyps, Esophageal cancer, Rectal cancer, or Stomach cancer.  ROS:   Please see the history of present illness.     EKGs/Labs/Other Studies Reviewed:    The following studies were reviewed today:  Coronary CTA in 12/08/2019: Impression: 1. Moderate CAD in proximal LAD, CADRADS = 3. CT FFR will be performed and reported separately. 2. Coronary calcium score is 62, which places the patient in the 78th percentile for age and sex matched control. 3. Normal coronary origin with right dominance.  FFR Findings: 1. Left Main: FFR = 0.98 2. LAD: Proximal FFR = 0.93, Mid FFR = 0.90, Distal FFR = 0.83 3. LCX: Proximal FFR = 0.97, Distal FFR = 0.94 OM  FFR = 0.87 4. RCA: Proximal FFR = 0.95, Mid FFR =0.90, Distal FFR = 0.89  FFR Impression: 1.  CT FFR analysis showed no significant stenosis.  EKG:  EKG not ordered today.   Recent Labs: 11/20/2020: ALT 28; BUN 14; Creatinine, Ser 0.78; Hemoglobin 12.4; Platelets 414.0; Potassium 3.6; Sodium 145  Recent Lipid Panel    Component Value Date/Time   CHOL 142 11/20/2020 1041   TRIG 74.0 11/20/2020 1041   HDL 65.30 11/20/2020 1041   CHOLHDL 2 11/20/2020 1041   VLDL 14.8 11/20/2020 1041   LDLCALC 62 11/20/2020 1041    Physical Exam:    Vital Signs: BP 118/80 (BP Location: Left Arm, Patient Position: Sitting, Cuff Size: Large)   Pulse 92   Ht 5\' 1"  (1.549 m)   Wt 174 lb 6.4 oz (79.1 kg)   SpO2 97%   BMI 32.95 kg/m     Wt Readings from Last 3 Encounters:  02/23/21 174 lb 6.4 oz (79.1 kg)  11/20/20 165 lb (74.8 kg)  05/31/20 155 lb 6.4 oz (70.5 kg)     General: 65 y.o. Caucasian female in no acute distress. HEENT: Normocephalic and atraumatic. Sclera clear. EOMs intact. Neck: Supple. No JVD. Heart: RRR. Distinct S1 and S2 with possible click. No murmurs, gallops, or rubs. Radial pulses 2+ and equal bilaterally. Lungs: No increased work of breathing. Clear to ausculation bilaterally. No wheezes, rhonchi, or rales.  Abdomen: Soft, non-distended, and non-tender to palpation.  Extremities: Mild non-pitting lower extremity edema bilaterally.  Skin: Warm and dry. Neuro: Alert and oriented x3. No focal deficits. Psych: Normal affect. Responds appropriately.  Assessment:    1. Coronary artery disease involving native coronary artery of native heart without angina pectoris   2. Possible mitral valve prolapse   3. Primary hypertension   4. Hyperlipidemia, unspecified hyperlipidemia type   5. Ehlers-Danlos syndrome   6. Obesity (BMI 30.0-34.9)     Plan:  Nonobstructive CAD - Moderated non-obstructive CAD involving proximal LAD noted on coronary CTA in 11/2019. FFR was negative.   - She describes very atypical chest pain last for second time but no angina. Suspect some of pain is due to esophageal spasms. - Continue aspirin and high-intensity statin. - Will refill sublingual Nitro.  Possible Mitral Valve Prolapse - Sounded like she had a mitral valve click on exam.  - She has not had an Echo. Will order one, especially given history of Ehler's Danlos Syndrome.  Hypertension - BP well controlled at 118/80.  However, she states BP often in the 130s/90s at home. - Continue Amlodipine  daily. - Have asked patient to monitor BP at home and let us know if BP consistently >130s/80s. If additional BP medications are needed, can consider adding beta-blocker given history of tachycardia and high normal rates today. Could also consider adding HCTZ given chronic lower extremity edema (although I am wondering if some of this is due to Amlodipine).  Hyperlipidemia - Recent lipid panel on 11/20/2020: Total Cholesterol 142, Triglycerides 74, HDL 65.3, LDL 62.  - At LDL goal of <70 given CAD. - Continue Lipitor  daily. - Labs followed by PCP.  Ehlers-Danlos Syndrome (Type III) - No vascular ectasias noted on coronary CTA in 11/2019.  - Will discuss with MD how often repeat imaging is recommended.   Obesity - BMI 32.95. Patient has gained about 20 lbs since 05/2020 after breaking her ankle and being in a wheelchair for 3 months.  - Encouraged increased physical activity and diet changes.   Disposition: Follow up in 6 months.   Medication Adjustments/Labs and Tests Ordered: Current medicines are reviewed at length with the patient today.  Concerns regarding medicines are outlined above.  Orders Placed This Encounter  Procedures   ECHOCARDIOGRAM COMPLETE   Meds ordered this encounter  Medications   nitroGLYCERIN (NITROSTAT) 0.4 MG SL tablet    Sig: Place 1 tablet (0.4 mg total) under the tongue every 5 (five) minutes as needed for chest pain.    Dispense:  25 tablet     Refill:  2    Order Specific Question:   Supervising Provider    Answer:   Chilton Si [[id number]]    Patient Instructions  Medication Instructions:  Your physician recommends that you continue on your current medications as directed. Please refer to the Current Medication list given to you today.  *If you need a refill on your cardiac medications before your next appointment, please call your pharmacy*  Lab Work: NONE ordered at this time of appointment   If you have labs (blood work) drawn today and your tests are completely normal, you will receive your results only by: MyChart Message (if you have MyChart) OR A paper copy in the mail If you have any lab test that is abnormal or we need to change your treatment, we will call you to review the results.  Testing/Procedures: Your physician has requested that you have an echocardiogram. Echocardiography is a painless test that uses sound waves to create images of your heart. It provides your doctor with information about the size and shape of your heart and how well your heart's chambers and valves are working. This procedure takes approximately one hour. There are no restrictions for this procedure.  Please scheduled for 4-5 weeks at [facility]   Follow-Up: At [facility], you and your health needs are our priority.  As part of our continuing mission to provide you with  exceptional heart care, we have created designated Provider Care Teams.  These Care Teams include your primary Cardiologist (physician) and Advanced Practice Providers (APPs -  Physician Assistants and Nurse Practitioners) who all work together to provide you with the care you need, when you need it.  Your next appointment:   6 month(s)  The format for your next appointment:   In Person  Provider:   Weston Brass, MD  Other Instructions    Signed, Corrin Parker, PA-C  02/23/2021 1:06 PM    [facility]

## 2021-02-20 DIAGNOSIS — F900 Attention-deficit hyperactivity disorder, predominantly inattentive type: Secondary | ICD-10-CM | POA: Diagnosis not present

## 2021-02-23 ENCOUNTER — Ambulatory Visit: Payer: Medicare PPO | Admitting: Student

## 2021-02-23 ENCOUNTER — Other Ambulatory Visit: Payer: Self-pay

## 2021-02-23 ENCOUNTER — Encounter: Payer: Self-pay | Admitting: Student

## 2021-02-23 VITALS — BP 118/80 | HR 92 | Ht 61.0 in | Wt 174.4 lb

## 2021-02-23 DIAGNOSIS — E785 Hyperlipidemia, unspecified: Secondary | ICD-10-CM | POA: Diagnosis not present

## 2021-02-23 DIAGNOSIS — E669 Obesity, unspecified: Secondary | ICD-10-CM

## 2021-02-23 DIAGNOSIS — I2510 Atherosclerotic heart disease of native coronary artery without angina pectoris: Secondary | ICD-10-CM

## 2021-02-23 DIAGNOSIS — I341 Nonrheumatic mitral (valve) prolapse: Secondary | ICD-10-CM

## 2021-02-23 DIAGNOSIS — Q796 Ehlers-Danlos syndrome, unspecified: Secondary | ICD-10-CM | POA: Diagnosis not present

## 2021-02-23 DIAGNOSIS — I10 Essential (primary) hypertension: Secondary | ICD-10-CM | POA: Diagnosis not present

## 2021-02-23 MED ORDER — NITROGLYCERIN 0.4 MG SL SUBL: 0.4000 mg | SUBLINGUAL_TABLET | SUBLINGUAL | 2 refills | Status: DC | PRN

## 2021-02-23 NOTE — Patient Instructions (Signed)
Medication Instructions:  Your physician recommends that you continue on your current medications as directed. Please refer to the Current Medication list given to you today.  *If you need a refill on your cardiac medications before your next appointment, please call your pharmacy*  Lab Work: NONE ordered at this time of appointment   If you have labs (blood work) drawn today and your tests are completely normal, you will receive your results only by: MyChart Message (if you have MyChart) OR A paper copy in the mail If you have any lab test that is abnormal or we need to change your treatment, we will call you to review the results.  Testing/Procedures: Your physician has requested that you have an echocardiogram. Echocardiography is a painless test that uses sound waves to create images of your heart. It provides your doctor with information about the size and shape of your heart and how well your heart's chambers and valves are working. This procedure takes approximately one hour. There are no restrictions for this procedure.  Please scheduled for 4-5 weeks at [facility]   Follow-Up: At [facility], you and your health needs are our priority.  As part of our continuing mission to provide you with exceptional heart care, we have created designated Provider Care Teams.  These Care Teams include your primary Cardiologist (physician) and Advanced Practice Providers (APPs -  Physician Assistants and Nurse Practitioners) who all work together to provide you with the care you need, when you need it.  Your next appointment:   6 month(s)  The format for your next appointment:   In Person  Provider:   Weston Brass, MD  Other Instructions

## 2021-02-27 DIAGNOSIS — F900 Attention-deficit hyperactivity disorder, predominantly inattentive type: Secondary | ICD-10-CM | POA: Diagnosis not present

## 2021-02-28 DIAGNOSIS — M25571 Pain in right ankle and joints of right foot: Secondary | ICD-10-CM | POA: Diagnosis not present

## 2021-02-28 DIAGNOSIS — M25562 Pain in left knee: Secondary | ICD-10-CM | POA: Diagnosis not present

## 2021-02-28 DIAGNOSIS — M13862 Other specified arthritis, left knee: Secondary | ICD-10-CM | POA: Diagnosis not present

## 2021-03-08 DIAGNOSIS — F900 Attention-deficit hyperactivity disorder, predominantly inattentive type: Secondary | ICD-10-CM | POA: Diagnosis not present

## 2021-03-22 ENCOUNTER — Telehealth: Payer: Self-pay

## 2021-03-22 DIAGNOSIS — F900 Attention-deficit hyperactivity disorder, predominantly inattentive type: Secondary | ICD-10-CM | POA: Diagnosis not present

## 2021-03-22 NOTE — Telephone Encounter (Signed)
Have attempted to contact patient x3 to get scheduled to see Dr. Jacques Navy after her Echocardiogram. Unable to reach patient, left message for patient to return call to office to get scheduled.

## 2021-03-30 ENCOUNTER — Other Ambulatory Visit: Payer: Self-pay

## 2021-03-30 ENCOUNTER — Ambulatory Visit (HOSPITAL_COMMUNITY): Payer: Medicare PPO | Attending: Internal Medicine

## 2021-03-30 DIAGNOSIS — I341 Nonrheumatic mitral (valve) prolapse: Secondary | ICD-10-CM | POA: Diagnosis not present

## 2021-03-30 LAB — ECHOCARDIOGRAM COMPLETE
Area-P 1/2: 3.98 cm2
S' Lateral: 2.3 cm

## 2021-04-04 DIAGNOSIS — K219 Gastro-esophageal reflux disease without esophagitis: Secondary | ICD-10-CM | POA: Diagnosis not present

## 2021-04-04 DIAGNOSIS — F419 Anxiety disorder, unspecified: Secondary | ICD-10-CM | POA: Diagnosis not present

## 2021-04-04 DIAGNOSIS — I10 Essential (primary) hypertension: Secondary | ICD-10-CM | POA: Diagnosis not present

## 2021-04-04 DIAGNOSIS — G8929 Other chronic pain: Secondary | ICD-10-CM | POA: Diagnosis not present

## 2021-04-04 DIAGNOSIS — E785 Hyperlipidemia, unspecified: Secondary | ICD-10-CM | POA: Diagnosis not present

## 2021-04-04 DIAGNOSIS — J45909 Unspecified asthma, uncomplicated: Secondary | ICD-10-CM | POA: Diagnosis not present

## 2021-04-04 DIAGNOSIS — E663 Overweight: Secondary | ICD-10-CM | POA: Diagnosis not present

## 2021-04-04 DIAGNOSIS — M1990 Unspecified osteoarthritis, unspecified site: Secondary | ICD-10-CM | POA: Diagnosis not present

## 2021-04-04 DIAGNOSIS — I2510 Atherosclerotic heart disease of native coronary artery without angina pectoris: Secondary | ICD-10-CM | POA: Diagnosis not present

## 2021-04-05 DIAGNOSIS — F900 Attention-deficit hyperactivity disorder, predominantly inattentive type: Secondary | ICD-10-CM | POA: Diagnosis not present

## 2021-04-12 DIAGNOSIS — F900 Attention-deficit hyperactivity disorder, predominantly inattentive type: Secondary | ICD-10-CM | POA: Diagnosis not present

## 2021-04-19 DIAGNOSIS — F900 Attention-deficit hyperactivity disorder, predominantly inattentive type: Secondary | ICD-10-CM | POA: Diagnosis not present

## 2021-04-23 ENCOUNTER — Other Ambulatory Visit: Payer: Self-pay | Admitting: Internal Medicine

## 2021-04-26 DIAGNOSIS — F900 Attention-deficit hyperactivity disorder, predominantly inattentive type: Secondary | ICD-10-CM | POA: Diagnosis not present

## 2021-05-03 DIAGNOSIS — F900 Attention-deficit hyperactivity disorder, predominantly inattentive type: Secondary | ICD-10-CM | POA: Diagnosis not present

## 2021-05-17 DIAGNOSIS — F900 Attention-deficit hyperactivity disorder, predominantly inattentive type: Secondary | ICD-10-CM | POA: Diagnosis not present

## 2021-05-24 DIAGNOSIS — F900 Attention-deficit hyperactivity disorder, predominantly inattentive type: Secondary | ICD-10-CM | POA: Diagnosis not present

## 2021-05-31 DIAGNOSIS — F900 Attention-deficit hyperactivity disorder, predominantly inattentive type: Secondary | ICD-10-CM | POA: Diagnosis not present

## 2021-06-07 DIAGNOSIS — F900 Attention-deficit hyperactivity disorder, predominantly inattentive type: Secondary | ICD-10-CM | POA: Diagnosis not present

## 2021-06-28 DIAGNOSIS — F900 Attention-deficit hyperactivity disorder, predominantly inattentive type: Secondary | ICD-10-CM | POA: Diagnosis not present

## 2021-07-05 DIAGNOSIS — F900 Attention-deficit hyperactivity disorder, predominantly inattentive type: Secondary | ICD-10-CM | POA: Diagnosis not present

## 2021-07-19 DIAGNOSIS — F900 Attention-deficit hyperactivity disorder, predominantly inattentive type: Secondary | ICD-10-CM | POA: Diagnosis not present

## 2021-07-24 ENCOUNTER — Other Ambulatory Visit: Payer: Self-pay | Admitting: Internal Medicine

## 2021-07-26 DIAGNOSIS — F900 Attention-deficit hyperactivity disorder, predominantly inattentive type: Secondary | ICD-10-CM | POA: Diagnosis not present

## 2021-08-02 DIAGNOSIS — F900 Attention-deficit hyperactivity disorder, predominantly inattentive type: Secondary | ICD-10-CM | POA: Diagnosis not present

## 2021-08-09 DIAGNOSIS — F900 Attention-deficit hyperactivity disorder, predominantly inattentive type: Secondary | ICD-10-CM | POA: Diagnosis not present

## 2021-08-16 DIAGNOSIS — F900 Attention-deficit hyperactivity disorder, predominantly inattentive type: Secondary | ICD-10-CM | POA: Diagnosis not present

## 2021-08-23 DIAGNOSIS — F900 Attention-deficit hyperactivity disorder, predominantly inattentive type: Secondary | ICD-10-CM | POA: Diagnosis not present

## 2021-08-27 ENCOUNTER — Other Ambulatory Visit: Payer: Self-pay | Admitting: Obstetrics and Gynecology

## 2021-08-27 DIAGNOSIS — Z1231 Encounter for screening mammogram for malignant neoplasm of breast: Secondary | ICD-10-CM

## 2021-08-28 ENCOUNTER — Ambulatory Visit
Admission: RE | Admit: 2021-08-28 | Discharge: 2021-08-28 | Disposition: A | Discharge status: Home / Self Care | Payer: Medicare PPO | Source: Ambulatory Visit | Attending: Obstetrics and Gynecology | Admitting: Obstetrics and Gynecology

## 2021-08-28 DIAGNOSIS — Z1231 Encounter for screening mammogram for malignant neoplasm of breast: Secondary | ICD-10-CM

## 2021-08-30 DIAGNOSIS — F900 Attention-deficit hyperactivity disorder, predominantly inattentive type: Secondary | ICD-10-CM | POA: Diagnosis not present

## 2021-09-03 NOTE — Progress Notes (Signed)
?Cardiology Office Note:   ? ?Date:  09/13/2021  ? ?ID:  [name], DOB Mar 03, 1956, MRN [id number] ? ?PCP:  Hoyt Koch, MD  ?Cardiologist:  Elouise Munroe, MD  ?Electrophysiologist:  None  ? ?Referring MD: Hoyt Koch, *  ? ?Chief Complaint/Reason for Referral: ?Chest pain ? ?History of Present Illness:   ? ?[name] [name] is a 66 y.o. female with a history of Ehlers-Danlos syndrome type III, fatty liver, fibromyalgia, hypertension (resolved upon weight loss), hyperlipidemia, prediabetes and history of obesity s/p bariatric surgery in 2017. Symptoms seem most consistent with esophageal spasm after food was stuck briefly in her esophagus by subjective report on hospital presentation. Due to CAC on CTPE, Q waves on ECG and risk factors, we determined in shared decision making to perform CCTA as an outpatient, showing moderate, nonobstructive CAD. We discussed medical management of CAD. ? ?Doing well overall. Some dyspnea on exertion which she feels is related to deconditioning, we discussed exercise recommendations. Reviewed echo and CCTA results today.  ? ?The patient denies chest pain, chest pressure, dyspnea at rest or with exertion, palpitations, PND, orthopnea, or leg swelling. Denies cough, fever, chills. Denies nausea, vomiting. Denies syncope or presyncope. Denies dizziness or lightheadedness. ? ?Past Medical History:  ?Diagnosis Date  ? Allergy   ? seasonal  ? Anxiety   ? Arthritis   ? Asthma   ? Complication of anesthesia   ? headache after neck surgery  ? Ehlers-Danlos syndrome type III   ? Fatty liver   ? Fibromyalgia   ? GERD (gastroesophageal reflux disease)   ? history of  ? Heart murmur   ? History of blood in urine   ? History of bronchitis   ? History of cholelithiasis   ? History of kidney stones   ? Hyperlipemia   ? Hyperparathyroidism ([place])   ? Hypertension   ? no medication needed since bariatric surgery  ? Insomnia   ? Leg pain   ? Low back pain   ? Obese   ? history  of  ? Pneumonia   ? history of   ? Pre-diabetes   ? no since weight loss  ? Sleep apnea   ? improved since weight loss  ? ? ?Past Surgical History:  ?Procedure Laterality Date  ? achilles tendon tibial tendon fusion    ? 4 surgeries  ? BREAST BIOPSY Right   ? BREAST BIOPSY Left   ? BREAST BIOPSY Left   ? CERVICAL DISCECTOMY    ? 2010  ? CESAREAN SECTION    ? CHOLECYSTECTOMY    ? COLONOSCOPY    ? CYSTOSCOPY WITH RETROGRADE PYELOGRAM, URETEROSCOPY AND STENT PLACEMENT Right 08/01/2017  ? Procedure: CYSTOSCOPY WITH RETROGRADE PYELOGRAM, URETEROSCOPY AND STENT PLACEMENT;  Surgeon: Alexis Frock, MD;  Location: [facility];  Service: Urology;  Laterality: Right;  ? HOLMIUM LASER APPLICATION Right [id number]  ? Procedure: HOLMIUM LASER APPLICATION;  Surgeon: Alexis Frock, MD;  Location: [facility];  Service: Urology;  Laterality: Right;  ? JOINT REPLACEMENT Right   ? partial joint replacement knee  ? LAPAROSCOPIC GASTRIC RESTRICTIVE DUODENAL PROCEDURE (DUODENAL SWITCH)    ? 2017  ? lower back    ? 2008  ? spinal injections    ? SPINE SURGERY    ? L4-L5, C6-C7   ? ? ?Current Medications: ?Current Meds  ?Medication Sig  ? albuterol (VENTOLIN HFA) 108 (90 Base) MCG/ACT inhaler Inhale 2 puffs into the lungs every  6 (six) hours as needed for wheezing.  ? ALPRAZolam (XANAX) 0.5 MG tablet TAKE 1 TABLET BY MOUTH 2 TIMES DAILY AS NEEDED FOR ANXIETY  ? amLODipine (NORVASC) 10 MG tablet Take 1 tablet (10 mg total) by mouth daily.  ? aspirin EC 81 MG tablet Take 1 tablet (81 mg total) by mouth daily. Swallow whole.  ? atorvastatin (LIPITOR) 40 MG tablet Take 1 tablet (40 mg total) by mouth daily.  ? fexofenadine (ALLEGRA) 180 MG tablet Take 180 mg by mouth daily.  ? Multiple Vitamins-Minerals (BARIATRIC MULTIVITAMINS/IRON PO) Take 3 tablets by mouth daily.   ? potassium chloride SA (KLOR-CON) 20 MEQ tablet Take 1 tablet (20 mEq total) by mouth daily.  ? solifenacin (VESICARE) 10 MG tablet Take 1  tablet (10 mg total) by mouth daily.  ? venlafaxine XR (EFFEXOR-XR) 150 MG 24 hr capsule Take 1 capsule (150 mg total) by mouth daily.  ?  ? ?Allergies:   Patient has no known allergies.  ? ?Social History  ? ?Tobacco Use  ? Smoking status: Never  ? Smokeless tobacco: Never  ?Vaping Use  ? Vaping Use: Never used  ?Substance Use Topics  ? Alcohol use: Yes  ?  Alcohol/week: 0.0 standard drinks  ?  Comment: rare  ? Drug use: No  ?  ? ?Family History: ?The patient's family history includes Healthy in her daughter; Heart disease in her father; Pancreatic cancer in her sister; Parkinson's disease in her mother. There is no history of Colon cancer, Colon polyps, Esophageal cancer, Rectal cancer, Stomach cancer, or Breast cancer. ? ?ROS:   ?Please see the history of present illness.    ?All other systems reviewed and are negative. ? ?EKGs/Labs/Other Studies Reviewed:   ? ?The following studies were reviewed today: ? ?EKG:  NSR, inf and ant infarct pattern ? ?Recent Labs: ?11/20/2020: ALT 28; BUN 14; Creatinine, Ser 0.78; Hemoglobin 12.4; Platelets 414.0; Potassium 3.6; Sodium 145  ?Recent Lipid Panel ?   ?Component Value Date/Time  ? CHOL 142 11/20/2020 1041  ? TRIG 74.0 11/20/2020 1041  ? HDL 65.30 11/20/2020 1041  ? CHOLHDL 2 11/20/2020 1041  ? VLDL 14.8 11/20/2020 1041  ? Mason 62 11/20/2020 1041  ? ? ?Physical Exam:   ? ?VS:  BP 132/84   Pulse 80   Ht 5\' 1"  (1.549 m)   Wt 187 lb 9.6 oz (85.1 kg)   SpO2 98%   BMI 35.45 kg/m?    ? ?Wt Readings from Last 5 Encounters:  ?09/13/21 187 lb 9.6 oz (85.1 kg)  ?02/23/21 174 lb 6.4 oz (79.1 kg)  ?11/20/20 165 lb (74.8 kg)  ?05/31/20 155 lb 6.4 oz (70.5 kg)  ?12/09/19 144 lb 6.4 oz (65.5 kg)  ?  ?Constitutional: No acute distress ?Eyes: sclera non-icteric, normal conjunctiva and lids ?ENMT: normal dentition, moist mucous membranes ?Cardiovascular: regular rhythm, normal rate, no murmurs. S1 and S2 normal. Radial pulses normal bilaterally. No jugular venous distention.   ?Respiratory: clear to auscultation bilaterally ?GI : normal bowel sounds, soft and nontender. No distention.   ?MSK: extremities warm, well perfused. No edema.  ?NEURO: grossly nonfocal exam, moves all extremities. ?PSYCH: alert and oriented x 3, normal mood and affect.  ? ?ASSESSMENT:   ? ?1. Coronary artery disease involving native coronary artery of native heart without angina pectoris   ?2. Primary hypertension   ?3. Hyperlipidemia, unspecified hyperlipidemia type   ?4. Ehlers-Danlos syndrome   ?5. Obesity (BMI 35.0-39.9 without comorbidity)   ?6. Dyspnea on  exertion   ? ? ?PLAN:   ? ?Coronary artery disease involving native coronary artery of native heart without angina pectoris - Plan: EKG 12-Lead ?- continue ASA 81 mg daily and statin ?- sublingual nitro for chest pain, likely esophageal spasm. ? ?Essential hypertension - Plan: EKG 12-Lead ?-BP stable, continue amlodipine 10 mg daily ? ?HLD - continue atorvastatin 40 mg daily. Last labs 6/22 - LDL 62, Trig 74, HDL 65 ? ?Ehlers-Danlos syndrome - no vascular ectasias noted in visualized portions of CCTA. No mitral valve prolapse, mild MR. ? ?DOE ?Obesity  ?- likely doe from deconditioning. Exercise recommendations discussed today and noted below. ? ?Exercise recommendations: ?Goal of exercising for at least 30 minutes a day, at least 5 times per week.  Please exercise to a moderate exertion.  This means that while exercising it is difficult to speak in full sentences, however you are not so short of breath that you feel you must stop, and not so comfortable that you can carry on a full conversation.  Exertion level should be approximately a 5/10, if 10 is the most exertion you can perform. ? ?Diet recommendations: ?Recommend a heart healthy diet such as the Mediterranean diet.  This diet consists of plant based foods, healthy fats, lean meats, olive oil.  It suggests limiting the intake of simple carbohydrates such as white breads, pastries, and pastas.  It  also limits the amount of red meat, wine, and dairy products such as cheese that one should consume on a daily basis. ? ? ?Total time of encounter: ?30 minutes total time of encounter, including 20 minutes spent in face-

## 2021-09-13 ENCOUNTER — Encounter: Payer: Self-pay | Admitting: Internal Medicine

## 2021-09-13 ENCOUNTER — Ambulatory Visit: Payer: Medicare PPO | Admitting: Internal Medicine

## 2021-09-13 VITALS — BP 132/84 | HR 80 | Ht 61.0 in | Wt 187.6 lb

## 2021-09-13 DIAGNOSIS — Q796 Ehlers-Danlos syndrome, unspecified: Secondary | ICD-10-CM

## 2021-09-13 DIAGNOSIS — I10 Essential (primary) hypertension: Secondary | ICD-10-CM

## 2021-09-13 DIAGNOSIS — E785 Hyperlipidemia, unspecified: Secondary | ICD-10-CM

## 2021-09-13 DIAGNOSIS — R0609 Other forms of dyspnea: Secondary | ICD-10-CM

## 2021-09-13 DIAGNOSIS — F900 Attention-deficit hyperactivity disorder, predominantly inattentive type: Secondary | ICD-10-CM | POA: Diagnosis not present

## 2021-09-13 DIAGNOSIS — I2510 Atherosclerotic heart disease of native coronary artery without angina pectoris: Secondary | ICD-10-CM | POA: Diagnosis not present

## 2021-09-13 DIAGNOSIS — E669 Obesity, unspecified: Secondary | ICD-10-CM | POA: Diagnosis not present

## 2021-09-13 NOTE — Patient Instructions (Signed)
Medication Instructions:  ?No Changes In Medications at this time.  ?*If you need a refill on your cardiac medications before your next appointment, please call your pharmacy* ? ?Follow-Up: ?At [facility], you and your health needs are our priority.  As part of our continuing mission to provide you with exceptional heart care, we have created designated Provider Care Teams.  These Care Teams include your primary Cardiologist (physician) and Advanced Practice Providers (APPs -  Physician Assistants and Nurse Practitioners) who all work together to provide you with the care you need, when you need it. ? ?Your next appointment:   ?6 month(s) ? ?The format for your next appointment:   ?In Person ? ?Provider:   ?Gayatri A Acharya, MD   ? ?  ?

## 2021-09-20 DIAGNOSIS — F900 Attention-deficit hyperactivity disorder, predominantly inattentive type: Secondary | ICD-10-CM | POA: Diagnosis not present

## 2021-09-27 DIAGNOSIS — F900 Attention-deficit hyperactivity disorder, predominantly inattentive type: Secondary | ICD-10-CM | POA: Diagnosis not present

## 2021-10-04 DIAGNOSIS — F900 Attention-deficit hyperactivity disorder, predominantly inattentive type: Secondary | ICD-10-CM | POA: Diagnosis not present

## 2021-10-05 DIAGNOSIS — H353134 Nonexudative age-related macular degeneration, bilateral, advanced atrophic with subfoveal involvement: Secondary | ICD-10-CM | POA: Diagnosis not present

## 2021-10-11 DIAGNOSIS — F900 Attention-deficit hyperactivity disorder, predominantly inattentive type: Secondary | ICD-10-CM | POA: Diagnosis not present

## 2021-10-18 DIAGNOSIS — F900 Attention-deficit hyperactivity disorder, predominantly inattentive type: Secondary | ICD-10-CM | POA: Diagnosis not present

## 2021-10-25 DIAGNOSIS — F900 Attention-deficit hyperactivity disorder, predominantly inattentive type: Secondary | ICD-10-CM | POA: Diagnosis not present

## 2021-10-26 DIAGNOSIS — H353132 Nonexudative age-related macular degeneration, bilateral, intermediate dry stage: Secondary | ICD-10-CM | POA: Diagnosis not present

## 2021-10-26 DIAGNOSIS — H2512 Age-related nuclear cataract, left eye: Secondary | ICD-10-CM | POA: Diagnosis not present

## 2021-10-26 DIAGNOSIS — M1712 Unilateral primary osteoarthritis, left knee: Secondary | ICD-10-CM | POA: Diagnosis not present

## 2021-10-26 DIAGNOSIS — S90911D Unspecified superficial injury of right ankle, subsequent encounter: Secondary | ICD-10-CM | POA: Diagnosis not present

## 2021-10-30 DIAGNOSIS — Z7689 Persons encountering health services in other specified circumstances: Secondary | ICD-10-CM | POA: Diagnosis not present

## 2021-10-30 DIAGNOSIS — K909 Intestinal malabsorption, unspecified: Secondary | ICD-10-CM | POA: Diagnosis not present

## 2021-10-30 DIAGNOSIS — Z9884 Bariatric surgery status: Secondary | ICD-10-CM | POA: Diagnosis not present

## 2021-10-30 DIAGNOSIS — H353132 Nonexudative age-related macular degeneration, bilateral, intermediate dry stage: Secondary | ICD-10-CM | POA: Diagnosis not present

## 2021-10-30 DIAGNOSIS — H2513 Age-related nuclear cataract, bilateral: Secondary | ICD-10-CM | POA: Diagnosis not present

## 2021-10-30 DIAGNOSIS — Q796 Ehlers-Danlos syndrome, unspecified: Secondary | ICD-10-CM | POA: Diagnosis not present

## 2021-10-30 DIAGNOSIS — Z6834 Body mass index (BMI) 34.0-34.9, adult: Secondary | ICD-10-CM | POA: Diagnosis not present

## 2021-10-30 DIAGNOSIS — R635 Abnormal weight gain: Secondary | ICD-10-CM | POA: Diagnosis not present

## 2021-11-04 DIAGNOSIS — H2513 Age-related nuclear cataract, bilateral: Secondary | ICD-10-CM | POA: Diagnosis not present

## 2021-11-05 DIAGNOSIS — H52222 Regular astigmatism, left eye: Secondary | ICD-10-CM | POA: Diagnosis not present

## 2021-11-05 DIAGNOSIS — H2512 Age-related nuclear cataract, left eye: Secondary | ICD-10-CM | POA: Diagnosis not present

## 2021-11-08 DIAGNOSIS — F900 Attention-deficit hyperactivity disorder, predominantly inattentive type: Secondary | ICD-10-CM | POA: Diagnosis not present

## 2021-11-15 ENCOUNTER — Telehealth: Payer: Self-pay | Admitting: Internal Medicine

## 2021-11-15 NOTE — Telephone Encounter (Signed)
N/A unable to leave a message for patient to call back to schedule Medicare Annual Wellness Visit   Last AWV  11/20/20  Please schedule at anytime with [facility] [place] if patient calls the office back.    Any questions, please call me at [phone]

## 2021-11-18 DIAGNOSIS — H2513 Age-related nuclear cataract, bilateral: Secondary | ICD-10-CM | POA: Diagnosis not present

## 2021-11-19 DIAGNOSIS — H2511 Age-related nuclear cataract, right eye: Secondary | ICD-10-CM | POA: Diagnosis not present

## 2021-11-19 DIAGNOSIS — H269 Unspecified cataract: Secondary | ICD-10-CM | POA: Diagnosis not present

## 2021-11-21 DIAGNOSIS — Z6836 Body mass index (BMI) 36.0-36.9, adult: Secondary | ICD-10-CM | POA: Diagnosis not present

## 2021-11-21 DIAGNOSIS — Z1151 Encounter for screening for human papillomavirus (HPV): Secondary | ICD-10-CM | POA: Diagnosis not present

## 2021-11-21 DIAGNOSIS — N952 Postmenopausal atrophic vaginitis: Secondary | ICD-10-CM | POA: Diagnosis not present

## 2021-11-21 DIAGNOSIS — Z124 Encounter for screening for malignant neoplasm of cervix: Secondary | ICD-10-CM | POA: Diagnosis not present

## 2021-11-21 DIAGNOSIS — R29890 Loss of height: Secondary | ICD-10-CM | POA: Diagnosis not present

## 2021-11-21 DIAGNOSIS — M8588 Other specified disorders of bone density and structure, other site: Secondary | ICD-10-CM | POA: Diagnosis not present

## 2021-11-21 DIAGNOSIS — N958 Other specified menopausal and perimenopausal disorders: Secondary | ICD-10-CM | POA: Diagnosis not present

## 2021-11-21 DIAGNOSIS — N941 Unspecified dyspareunia: Secondary | ICD-10-CM | POA: Diagnosis not present

## 2021-11-22 DIAGNOSIS — F900 Attention-deficit hyperactivity disorder, predominantly inattentive type: Secondary | ICD-10-CM | POA: Diagnosis not present

## 2021-11-27 ENCOUNTER — Ambulatory Visit (INDEPENDENT_AMBULATORY_CARE_PROVIDER_SITE_OTHER): Payer: Medicare PPO

## 2021-11-27 VITALS — BP 140/80 | HR 86 | Temp 97.6°F | Ht 61.0 in | Wt 186.2 lb

## 2021-11-27 DIAGNOSIS — Z0000 Encounter for general adult medical examination without abnormal findings: Secondary | ICD-10-CM

## 2021-11-27 NOTE — Progress Notes (Signed)
Subjective:   [name] [name] is a 66 y.o. female who presents for Medicare Annual (Subsequent) preventive examination.  Review of Systems     Cardiac Risk Factors include: advanced age (>62men, >92 women);dyslipidemia;hypertension;obesity (BMI >30kg/m2)     Objective:    Today's Vitals   11/27/21 1451 11/27/21 1523  BP: 140/80   Pulse: 86   Temp: 97.6 F (36.4 C)   SpO2: 97%   Weight: 186 lb 3.2 oz (84.5 kg)   Height: 5\' 1"  (1.549 m)   PainSc: 4  6   PainLoc: Knee    Body mass index is 35.18 kg/m.     11/27/2021    3:27 PM 11/15/2019    3:20 AM 08/01/2017   11:05 AM  Advanced Directives  Does Patient Have a Medical Advance Directive? Yes Yes Yes  Type of Advance Directive Living will;Healthcare Power of [place];Living will [place];Living will  Does patient want to make changes to medical advance directive? No - Patient declined No - Patient declined   Copy of [place] in Chart? No - copy requested No - copy requested No - copy requested    Current Medications (verified) Outpatient Encounter Medications as of 11/27/2021  Medication Sig   albuterol (VENTOLIN HFA) 108 (90 Base) MCG/ACT inhaler Inhale 2 puffs into the lungs every 6 (six) hours as needed for wheezing.   ALPRAZolam (XANAX) 0.5 MG tablet TAKE 1 TABLET BY MOUTH 2 TIMES DAILY AS NEEDED FOR ANXIETY   amLODipine (NORVASC) 10 MG tablet Take 1 tablet (10 mg total) by mouth daily.   aspirin EC 81 MG tablet Take 1 tablet (81 mg total) by mouth daily. Swallow whole.   atorvastatin (LIPITOR) 40 MG tablet Take 1 tablet (40 mg total) by mouth daily.   fexofenadine (ALLEGRA) 180 MG tablet Take 180 mg by mouth daily.   Multiple Vitamins-Minerals (BARIATRIC MULTIVITAMINS/IRON PO) Take 3 tablets by mouth daily.    potassium chloride SA (KLOR-CON) 20 MEQ tablet Take 1 tablet (20 mEq total) by mouth daily.   solifenacin (VESICARE) 10 MG tablet Take 1 tablet  (10 mg total) by mouth daily.   venlafaxine XR (EFFEXOR-XR) 150 MG 24 hr capsule Take 1 capsule (150 mg total) by mouth daily.   nitroGLYCERIN (NITROSTAT) 0.4 MG SL tablet Place 1 tablet (0.4 mg total) under the tongue every 5 (five) minutes as needed for chest pain.   No facility-administered encounter medications on file as of 11/27/2021.    Allergies (verified) Patient has no known allergies.   History: Past Medical History:  Diagnosis Date   Allergy    seasonal   Anxiety    Arthritis    Asthma    Complication of anesthesia    headache after neck surgery   Ehlers-Danlos syndrome type III    Fatty liver    Fibromyalgia    GERD (gastroesophageal reflux disease)    history of   Heart murmur    History of blood in urine    History of bronchitis    History of cholelithiasis    History of kidney stones    Hyperlipemia    Hyperparathyroidism ([place])    Hypertension    no medication needed since bariatric surgery   Insomnia    Leg pain    Low back pain    Obese    history of   Pneumonia    history of    Pre-diabetes    no since  weight loss   Sleep apnea    improved since weight loss   Past Surgical History:  Procedure Laterality Date   achilles tendon tibial tendon fusion     4 surgeries   BREAST BIOPSY Right    BREAST BIOPSY Left    BREAST BIOPSY Left    CERVICAL DISCECTOMY     2010   CESAREAN SECTION     CHOLECYSTECTOMY     COLONOSCOPY     CYSTOSCOPY WITH RETROGRADE PYELOGRAM, URETEROSCOPY AND STENT PLACEMENT Right 08/01/2017   Procedure: CYSTOSCOPY WITH RETROGRADE PYELOGRAM, URETEROSCOPY AND STENT PLACEMENT;  Surgeon: Alexis Frock, MD;  Location[facility];  Service: Urology;  Laterality: Right;   HOLMIUM LASER APPLICATION Right [id number]   Procedure: HOLMIUM LASER APPLICATION;  Surgeon: Alexis Frock, MD;  Location[facility];  Service: Urology;  Laterality: Right;   JOINT REPLACEMENT Right    partial joint replacement  knee   LAPAROSCOPIC GASTRIC RESTRICTIVE DUODENAL PROCEDURE (DUODENAL SWITCH)     2017   lower back     2008   spinal injections     SPINE SURGERY     L4-L5, C6-C7    Family History  Problem Relation Age of Onset   Parkinson's disease Mother        Deceased, 11   Heart disease Father        Deceased, 23   Pancreatic cancer Sister        Survivor   Healthy Daughter    Colon cancer Neg Hx    Colon polyps Neg Hx    Esophageal cancer Neg Hx    Rectal cancer Neg Hx    Stomach cancer Neg Hx    Breast cancer Neg Hx    Social History   Socioeconomic History   Marital status: Married    Spouse name: Not on file   Number of children: Not on file   Years of education: Not on file   Highest education level: Not on file  Occupational History   Not on file  Tobacco Use   Smoking status: Never   Smokeless tobacco: Never  Vaping Use   Vaping Use: Never used  Substance and Sexual Activity   Alcohol use: Yes    Alcohol/week: 0.0 standard drinks of alcohol    Comment: rare   Drug use: No   Sexual activity: Not on file  Other Topics Concern   Not on file  Social History Narrative   Lives with husband in a 2 story home.  Has 1 daughter.     On disability in 2009.  Used to work as an Insurance underwriter at [employer].      Social Determinants of Health   Financial Resource Strain: Low Risk  (11/27/2021)   Overall Financial Resource Strain (CARDIA)    Difficulty of Paying Living Expenses: Not hard at all  Food Insecurity: No Food Insecurity (11/27/2021)   Hunger Vital Sign    Worried About Running Out of Food in the Last Year: Never true    Ran Out of Food in the Last Year: Never true  Transportation Needs: No Transportation Needs (11/27/2021)   PRAPARE - Hydrologist (Medical): No    Lack of Transportation (Non-Medical): No  Physical Activity: Sufficiently Active (11/27/2021)   Exercise Vital Sign    Days of Exercise per Week: 5 days    Minutes of  Exercise per Session: 30 min  Stress: No Stress Concern Present (11/27/2021)  [employer] - Occupational Stress Questionnaire    Feeling of Stress : Not at all  Social Connections: Moderately Isolated (11/27/2021)   Social Connection and Isolation Panel [NHANES]    Frequency of Communication with Friends and Family: More than three times a week    Frequency of Social Gatherings with Friends and Family: More than three times a week    Attends Religious Services: Never    Marine scientist or Organizations: No    Attends Music therapist: Never    Marital Status: Married    Tobacco Counseling Counseling given: Not Answered   Clinical Intake:  Pre-visit preparation completed: Yes  Pain : 0-10 Pain Score: 6  Pain Type: Chronic pain Pain Location: Knee Pain Orientation: Right Pain Descriptors / Indicators: Aching, Discomfort, Dull Pain Onset: More than a month ago Pain Frequency: Intermittent Pain Relieving Factors: Topical Creams, Naproxen Effect of Pain on Daily Activities: Pain can diminish job performance, lower motivation to exercise, and prevent you from completing daily tasks. Pain produces disability and affects the quality of life.  Pain Relieving Factors: Topical Creams, Naproxen  BMI - recorded: 35.18 Nutritional Risks: None Diabetes: No  How often do you need to have someone help you when you read instructions, pamphlets, or other written materials from your doctor or pharmacy?: 1 - Never What is the last grade level you completed in school?: Master's Degree  Diabetic? no  Interpreter Needed?: No  Information entered by :: Emelio Schneller N. Franke Menter, LPN.   Activities of Daily Living    11/27/2021    3:29 PM  In your present state of health, do you have any difficulty performing the following activities:  Hearing? 0  Vision? 0  Difficulty concentrating or making decisions? 0  Walking or climbing stairs? 0  Dressing  or bathing? 0  Doing errands, shopping? 0  Preparing Food and eating ? N  Using the Toilet? N  In the past six months, have you accidently leaked urine? N  Do you have problems with loss of bowel control? N  Managing your Medications? N  Managing your Finances? N  Housekeeping or managing your Housekeeping? N    Patient Care Team: Hoyt Koch, MD as PCP - General (Internal Medicine) Elouise Munroe, MD as PCP - Cardiology (Cardiology) Danice Goltz, MD as Consulting Physician (Ophthalmology)  Indicate any recent Medical Services you may have received from other than [facility] providers in the past year (date may be approximate).     Assessment:   This is a routine wellness examination for Daarina.  Hearing/Vision screen Hearing Screening - Comments:: No hearing aids. Vision Screening - Comments:: Patient had cataracts removed. No eyeglasses/contact lenses. Eye exam done by: Tama High, MD.  Dietary issues and exercise activities discussed: Current Exercise Habits: Structured exercise class, Type of exercise: walking;treadmill;stretching;strength training/weights, Time (Minutes): 30, Frequency (Times/Week): 5, Weekly Exercise (Minutes/Week): 150, Intensity: Moderate, Exercise limited by: orthopedic condition(s);respiratory conditions(s)   Goals Addressed             This Visit's Progress    My goal is to lose weight and continue with the Bariatric Wellness Program.       Also continue working out at [employer].      Depression Screen    11/27/2021    3:26 PM 11/20/2020   10:05 AM 11/17/2018   10:21 AM 03/17/2017   11:10 AM  PHQ 2/9 Scores  PHQ - 2 Score 0  0 0 0    Fall Risk    11/27/2021    3:29 PM 11/20/2020   10:04 AM 11/17/2018   10:20 AM 04/24/2015    9:45 AM  Fall Risk   Falls in the past year? 0 1 1 Yes  Number falls in past yr: 0 0 0 1  Injury with Fall? 0 1 0 No  Risk for fall due to : No Fall Risks   Impaired  balance/gait  Follow up Falls evaluation completed   Falls evaluation completed;Falls prevention discussed    FALL RISK PREVENTION PERTAINING TO THE HOME:  Any stairs in or around the home? No  If so, are there any without handrails? No  Home free of loose throw rugs in walkways, pet beds, electrical cords, etc? Yes  Adequate lighting in your home to reduce risk of falls? Yes   ASSISTIVE DEVICES UTILIZED TO PREVENT FALLS:  Life alert? No  Use of a cane, walker or w/c? Yes  Grab bars in the bathroom? No  Shower chair or bench in shower? No  Elevated toilet seat or a handicapped toilet? No   TIMED UP AND GO:  Was the test performed? No .  Length of time to ambulate 10 feet: 8 sec.   Gait steady and fast with assistive device  Cognitive Function:        11/27/2021    3:30 PM  6CIT Screen  What Year? 0 points  What month? 0 points  What time? 0 points  Count back from 20 0 points  Months in reverse 0 points  Repeat phrase 0 points  Total Score 0 points    Immunizations Immunization History  Administered Date(s) Administered   Influenza, High Dose Seasonal PF 03/01/2016, 03/07/2021   Influenza,inj,Quad PF,6+ Mos 05/31/2014, 03/02/2015, 03/17/2017   Influenza-Unspecified 02/26/2018, 02/16/2019   PFIZER(Purple Top)SARS-COV-2 Vaccination 09/27/2019, 10/12/2019   Pfizer Covid-19 Vaccine Bivalent Booster 7yrs & up 03/07/2021   Pneumococcal Polysaccharide-23 11/20/2020   Tdap 06/17/2010   Zoster Recombinat (Shingrix) 11/17/2018, 01/19/2019   Zoster, Live 08/30/2015    TDAP status: Due, Education has been provided regarding the importance of this vaccine. Advised may receive this vaccine at local pharmacy or Health Dept. Aware to provide a copy of the vaccination record if obtained from local pharmacy or Health Dept. Verbalized acceptance and understanding.  Flu Vaccine status: Up to date  Pneumococcal vaccine status: Up to date  Covid-19 vaccine status: Completed  vaccines  Qualifies for Shingles Vaccine? Yes   Zostavax completed Yes   Shingrix Completed?: Yes  Screening Tests Health Maintenance  Topic Date Due   DEXA SCAN  Never done   TETANUS/TDAP  06/17/2020   COVID-19 Vaccine (4 - Booster for Pfizer series) 05/02/2021   Pneumonia Vaccine 46+ Years old (2 - PCV) 11/20/2021   INFLUENZA VACCINE  01/15/2022   MAMMOGRAM  08/29/2023   COLONOSCOPY (Pts 45-37yrs Insurance coverage will need to be confirmed)  02/17/2029   Hepatitis C Screening  Completed   Zoster Vaccines- Shingrix  Completed   HPV VACCINES  Aged Out    Health Maintenance  Health Maintenance Due  Topic Date Due   DEXA SCAN  Never done   TETANUS/TDAP  06/17/2020   COVID-19 Vaccine (4 - Booster for Pfizer series) 05/02/2021   Pneumonia Vaccine 76+ Years old (2 - PCV) 11/20/2021    Colorectal cancer screening: Type of screening: Colonoscopy. Completed 02/18/2019. Repeat every 3 years  Mammogram status: Completed 08/28/2021. Repeat every year  Bone  Density status: never done/no record  Lung Cancer Screening: (Low Dose CT Chest recommended if Age 2-80 years, 30 pack-year currently smoking OR have quit w/in 15years.) does not qualify.   Lung Cancer Screening Referral: no  Additional Screening:  Hepatitis C Screening: does qualify; Completed 09/15/2017  Vision Screening: Recommended annual ophthalmology exams for early detection of glaucoma and other disorders of the eye. Is the patient up to date with their annual eye exam?  Yes  Who is the provider or what is the name of the office in which the patient attends annual eye exams? Tama High, MD. If pt is not established with a provider, would they like to be referred to a provider to establish care? No .   Dental Screening: Recommended annual dental exams for proper oral hygiene  Community Resource Referral / Chronic Care Management: CRR required this visit?  No   CCM required this visit?  No      Plan:      I have personally reviewed and noted the following in the patient's chart:   Medical and social history Use of alcohol, tobacco or illicit drugs  Current medications and supplements including opioid prescriptions.  Functional ability and status Nutritional status Physical activity Advanced directives List of other physicians Hospitalizations, surgeries, and ER visits in previous 12 months Vitals Screenings to include cognitive, depression, and falls Referrals and appointments  In addition, I have reviewed and discussed with patient certain preventive protocols, quality metrics, and best practice recommendations. A written personalized care plan for preventive services as well as general preventive health recommendations were provided to patient.     Sheral Flow, LPN   [id number]   Nurse Notes:  Diet: heart healthy Physical activity: active at [employer] Depression/mood screen: negative Hearing: intact to whispered voice, no loss detected Visual acuity: grossly normal with lens, performs annual eye exam  ADLs: capable Fall risk: none Home safety: good Cognitive evaluation: intact to orientation, naming, recall and repetition EOL planning: adv directives discussed, in place

## 2021-11-27 NOTE — Patient Instructions (Signed)
[name] [name] , Thank you for taking time to come for your Medicare Wellness Visit. I appreciate your ongoing commitment to your health goals. Please review the following plan we discussed and let me know if I can assist you in the future.   Screening recommendations/referrals: Colonoscopy: 02/18/2019; due every 3 years Mammogram: 08/28/2021; due every year Bone Density: never done Recommended yearly ophthalmology/optometry visit for glaucoma screening and checkup Recommended yearly dental visit for hygiene and checkup  Vaccinations: Influenza vaccine: 03/07/2021 Pneumococcal vaccine: 11/20/2020 Tdap vaccine: 06/17/2010; due every 10 years; (check with pharmacy at [facility]) Shingles vaccine: 11/17/2018, 01/19/2019   Covid-19: 09/27/2019, 10/12/2019, 03/07/2021; (Bivarent  booster due; check with [facility])  Advanced directives: Yes; Please bring a copy of your health care power of attorney and living will to the office at your convenience.  Conditions/risks identified: Yes  Next appointment: Please schedule your next Medicare Wellness Visit with your Nurse Health Advisor in 1 year by calling [phone].   Preventive Care 64 Years and Older, Female Preventive care refers to lifestyle choices and visits with your health care provider that can promote health and wellness. What does preventive care include? A yearly physical exam. This is also called an annual well check. Dental exams once or twice a year. Routine eye exams. Ask your health care provider how often you should have your eyes checked. Personal lifestyle choices, including: Daily care of your teeth and gums. Regular physical activity. Eating a healthy diet. Avoiding tobacco and drug use. Limiting alcohol use. Practicing safe sex. Taking low-dose aspirin every day. Taking vitamin and mineral supplements as recommended by your health care provider. What happens during an annual well check? The services and screenings done by your health  care provider during your annual well check will depend on your age, overall health, lifestyle risk factors, and family history of disease. Counseling  Your health care provider may ask you questions about your: Alcohol use. Tobacco use. Drug use. Emotional well-being. Home and relationship well-being. Sexual activity. Eating habits. History of falls. Memory and ability to understand (cognition). Work and work Statistician. Reproductive health. Screening  You may have the following tests or measurements: Height, weight, and BMI. Blood pressure. Lipid and cholesterol levels. These may be checked every 5 years, or more frequently if you are over 37 years old. Skin check. Lung cancer screening. You may have this screening every year starting at age 56 if you have a 30-pack-year history of smoking and currently smoke or have quit within the past 15 years. Fecal occult blood test (FOBT) of the stool. You may have this test every year starting at age 74. Flexible sigmoidoscopy or colonoscopy. You may have a sigmoidoscopy every 5 years or a colonoscopy every 10 years starting at age 34. Hepatitis C blood test. Hepatitis B blood test. Sexually transmitted disease (STD) testing. Diabetes screening. This is done by checking your blood sugar (glucose) after you have not eaten for a while (fasting). You may have this done every 1-3 years. Bone density scan. This is done to screen for osteoporosis. You may have this done starting at age 62. Mammogram. This may be done every 1-2 years. Talk to your health care provider about how often you should have regular mammograms. Talk with your health care provider about your test results, treatment options, and if necessary, the need for more tests. Vaccines  Your health care provider may recommend certain vaccines, such as: Influenza vaccine. This is recommended every year. Tetanus, diphtheria, and acellular pertussis (Tdap,  Td) vaccine. You may need a Td  booster every 10 years. Zoster vaccine. You may need this after age 15. Pneumococcal 13-valent conjugate (PCV13) vaccine. One dose is recommended after age 64. Pneumococcal polysaccharide (PPSV23) vaccine. One dose is recommended after age 73. Talk to your health care provider about which screenings and vaccines you need and how often you need them. This information is not intended to replace advice given to you by your health care provider. Make sure you discuss any questions you have with your health care provider. Document Released: 06/30/2015 Document Revised: 02/21/2016 Document Reviewed: 04/04/2015 Elsevier Interactive Patient Education  2017 [place] Prevention in the Home Falls can cause injuries. They can happen to people of all ages. There are many things you can do to make your home safe and to help prevent falls. What can I do on the outside of my home? Regularly fix the edges of walkways and driveways and fix any cracks. Remove anything that might make you trip as you walk through a door, such as a raised step or threshold. Trim any bushes or trees on the path to your home. Use bright outdoor lighting. Clear any walking paths of anything that might make someone trip, such as rocks or tools. Regularly check to see if handrails are loose or broken. Make sure that both sides of any steps have handrails. Any raised decks and porches should have guardrails on the edges. Have any leaves, snow, or ice cleared regularly. Use sand or salt on walking paths during winter. Clean up any spills in your garage right away. This includes oil or grease spills. What can I do in the bathroom? Use night lights. Install grab bars by the toilet and in the tub and shower. Do not use towel bars as grab bars. Use non-skid mats or decals in the tub or shower. If you need to sit down in the shower, use a plastic, non-slip stool. Keep the floor dry. Clean up any water that spills on the floor  as soon as it happens. Remove soap buildup in the tub or shower regularly. Attach bath mats securely with double-sided non-slip rug tape. Do not have throw rugs and other things on the floor that can make you trip. What can I do in the bedroom? Use night lights. Make sure that you have a light by your bed that is easy to reach. Do not use any sheets or blankets that are too big for your bed. They should not hang down onto the floor. Have a firm chair that has side arms. You can use this for support while you get dressed. Do not have throw rugs and other things on the floor that can make you trip. What can I do in the kitchen? Clean up any spills right away. Avoid walking on wet floors. Keep items that you use a lot in easy-to-reach places. If you need to reach something above you, use a strong step stool that has a grab bar. Keep electrical cords out of the way. Do not use floor polish or wax that makes floors slippery. If you must use wax, use non-skid floor wax. Do not have throw rugs and other things on the floor that can make you trip. What can I do with my stairs? Do not leave any items on the stairs. Make sure that there are handrails on both sides of the stairs and use them. Fix handrails that are broken or loose. Make sure that handrails are as  long as the stairways. Check any carpeting to make sure that it is firmly attached to the stairs. Fix any carpet that is loose or worn. Avoid having throw rugs at the top or bottom of the stairs. If you do have throw rugs, attach them to the floor with carpet tape. Make sure that you have a light switch at the top of the stairs and the bottom of the stairs. If you do not have them, ask someone to add them for you. What else can I do to help prevent falls? Wear shoes that: Do not have high heels. Have rubber bottoms. Are comfortable and fit you well. Are closed at the toe. Do not wear sandals. If you use a stepladder: Make sure that it is  fully opened. Do not climb a closed stepladder. Make sure that both sides of the stepladder are locked into place. Ask someone to hold it for you, if possible. Clearly mark and make sure that you can see: Any grab bars or handrails. First and last steps. Where the edge of each step is. Use tools that help you move around (mobility aids) if they are needed. These include: Canes. Walkers. Scooters. Crutches. Turn on the lights when you go into a dark area. Replace any light bulbs as soon as they burn out. Set up your furniture so you have a clear path. Avoid moving your furniture around. If any of your floors are uneven, fix them. If there are any pets around you, be aware of where they are. Review your medicines with your doctor. Some medicines can make you feel dizzy. This can increase your chance of falling. Ask your doctor what other things that you can do to help prevent falls. This information is not intended to replace advice given to you by your health care provider. Make sure you discuss any questions you have with your health care provider. Document Released: 03/30/2009 Document Revised: 11/09/2015 Document Reviewed: 07/08/2014 Elsevier Interactive Patient Education  2017 [employer].

## 2021-12-06 ENCOUNTER — Other Ambulatory Visit: Payer: Self-pay | Admitting: Internal Medicine

## 2021-12-06 DIAGNOSIS — F900 Attention-deficit hyperactivity disorder, predominantly inattentive type: Secondary | ICD-10-CM | POA: Diagnosis not present

## 2021-12-13 DIAGNOSIS — F900 Attention-deficit hyperactivity disorder, predominantly inattentive type: Secondary | ICD-10-CM | POA: Diagnosis not present

## 2022-01-03 ENCOUNTER — Other Ambulatory Visit: Payer: Self-pay | Admitting: Internal Medicine

## 2022-01-07 ENCOUNTER — Other Ambulatory Visit: Payer: Self-pay | Admitting: Internal Medicine

## 2022-01-10 ENCOUNTER — Other Ambulatory Visit: Payer: Self-pay | Admitting: Internal Medicine

## 2022-01-28 DIAGNOSIS — L282 Other prurigo: Secondary | ICD-10-CM | POA: Diagnosis not present

## 2022-02-01 ENCOUNTER — Other Ambulatory Visit: Payer: Self-pay | Admitting: Internal Medicine

## 2022-02-09 ENCOUNTER — Other Ambulatory Visit: Payer: Self-pay | Admitting: Internal Medicine

## 2022-02-14 DIAGNOSIS — F900 Attention-deficit hyperactivity disorder, predominantly inattentive type: Secondary | ICD-10-CM | POA: Diagnosis not present

## 2022-02-20 ENCOUNTER — Other Ambulatory Visit: Payer: Self-pay | Admitting: *Deleted

## 2022-02-20 ENCOUNTER — Telehealth: Payer: Self-pay | Admitting: Internal Medicine

## 2022-02-20 DIAGNOSIS — E782 Mixed hyperlipidemia: Secondary | ICD-10-CM

## 2022-02-20 MED ORDER — ATORVASTATIN CALCIUM 40 MG PO TABS: 40.0000 mg | ORAL_TABLET | Freq: Every day | ORAL | 3 refills | Status: DC

## 2022-02-20 NOTE — Addendum Note (Signed)
Addended by: Shelly Bombard on: 02/20/2022 03:24 PM   Modules accepted: Orders

## 2022-02-20 NOTE — Telephone Encounter (Signed)
Pt is requesting a refill on: atorvastatin (LIPITOR) 40 MG tablet amLODipine (NORVASC) 10 MG tablet solifenacin (VESICARE) 10 MG tablet  Pharmacy: [facility] - [place], [place] - [address]  LOV 11/20/20 ROV 03/05/22

## 2022-02-21 ENCOUNTER — Other Ambulatory Visit: Payer: Self-pay | Admitting: Internal Medicine

## 2022-02-28 DIAGNOSIS — F900 Attention-deficit hyperactivity disorder, predominantly inattentive type: Secondary | ICD-10-CM | POA: Diagnosis not present

## 2022-03-04 DIAGNOSIS — F900 Attention-deficit hyperactivity disorder, predominantly inattentive type: Secondary | ICD-10-CM | POA: Diagnosis not present

## 2022-03-05 ENCOUNTER — Encounter: Payer: Self-pay | Admitting: Internal Medicine

## 2022-03-05 ENCOUNTER — Ambulatory Visit (INDEPENDENT_AMBULATORY_CARE_PROVIDER_SITE_OTHER): Payer: Medicare PPO | Admitting: Internal Medicine

## 2022-03-05 VITALS — BP 124/82 | HR 89 | Ht 60.5 in | Wt 191.0 lb

## 2022-03-05 DIAGNOSIS — Z23 Encounter for immunization: Secondary | ICD-10-CM

## 2022-03-05 DIAGNOSIS — E782 Mixed hyperlipidemia: Secondary | ICD-10-CM | POA: Diagnosis not present

## 2022-03-05 DIAGNOSIS — J4520 Mild intermittent asthma, uncomplicated: Secondary | ICD-10-CM | POA: Diagnosis not present

## 2022-03-05 DIAGNOSIS — Z0000 Encounter for general adult medical examination without abnormal findings: Secondary | ICD-10-CM

## 2022-03-05 DIAGNOSIS — E213 Hyperparathyroidism, unspecified: Secondary | ICD-10-CM

## 2022-03-05 DIAGNOSIS — I10 Essential (primary) hypertension: Secondary | ICD-10-CM

## 2022-03-05 MED ORDER — TOPIRAMATE 50 MG PO TABS: 50.0000 mg | ORAL_TABLET | Freq: Every day | ORAL | 3 refills | Status: DC

## 2022-03-05 MED ORDER — VENLAFAXINE HCL ER 225 MG PO TB24: 225.0000 mg | ORAL_TABLET | Freq: Every day | ORAL | 3 refills | Status: DC

## 2022-03-05 NOTE — Progress Notes (Signed)
   Subjective:   Patient ID: [name] [name], female    DOB: Jul 18, 1955, 66 y.o.   MRN: [id number]  HPI The patient is here for physical.  PMH, [facility], social history reviewed and updated  Review of Systems  Constitutional: Negative.   HENT: Negative.    Eyes: Negative.   Respiratory:  Negative for cough, chest tightness and shortness of breath.   Cardiovascular:  Negative for chest pain, palpitations and leg swelling.  Gastrointestinal:  Negative for abdominal distention, abdominal pain, constipation, diarrhea, nausea and vomiting.  Musculoskeletal: Negative.   Skin: Negative.   Neurological: Negative.   Psychiatric/Behavioral: Negative.      Objective:  Physical Exam Constitutional:      Appearance: She is well-developed.  HENT:     Head: Normocephalic and atraumatic.  Cardiovascular:     Rate and Rhythm: Normal rate and regular rhythm.  Pulmonary:     Effort: Pulmonary effort is normal. No respiratory distress.     Breath sounds: Normal breath sounds. No wheezing or rales.  Abdominal:     General: Bowel sounds are normal. There is no distension.     Palpations: Abdomen is soft.     Tenderness: There is no abdominal tenderness. There is no rebound.  Musculoskeletal:     Cervical back: Normal range of motion.  Skin:    General: Skin is warm and dry.  Neurological:     Mental Status: She is alert and oriented to person, place, and time.     Coordination: Coordination normal.     Vitals:   03/05/22 1305  BP: 124/82  Pulse: 89  SpO2: 96%  Weight: 191 lb (86.6 kg)  Height: 5' 0.5" (1.537 m)    Assessment & Plan:  Flu shot given at visit

## 2022-03-05 NOTE — Patient Instructions (Addendum)
We have sent in topirimate to take 50 mg at night for 2-3 weeks and then increase to 100 mg if desired.

## 2022-03-06 ENCOUNTER — Ambulatory Visit: Payer: Medicare PPO | Admitting: Family Medicine

## 2022-03-06 ENCOUNTER — Encounter: Payer: Self-pay | Admitting: Family Medicine

## 2022-03-06 VITALS — BP 134/86 | HR 88 | Temp 97.5°F | Ht 60.5 in | Wt 192.0 lb

## 2022-03-06 DIAGNOSIS — H6121 Impacted cerumen, right ear: Secondary | ICD-10-CM

## 2022-03-06 NOTE — Progress Notes (Signed)
PRE-PROCEDURE EXAM: Left TM cannot be visualized due to total occlusion/impaction of the ear canal.  PROCEDURE INDICATION: remove wax to visualize ear drum & relieve discomfort  CONSENT:  Verbal     PROCEDURE NOTE:     right EAR:  I used warm water irrigation under direct visualization with the otoscope to free the wax bolus from the ear canal.    POST- PROCEDURE EXAM: TMs successfully visualized and found to have no erythema     The patient tolerated the procedure well.

## 2022-03-06 NOTE — Progress Notes (Signed)
Subjective:     Patient ID: [name] [name], female    DOB: 20-May-1956, 66 y.o.   MRN: [id number]  Chief Complaint  Patient presents with   Ear Fullness    Physical with Dr.Crawford yesterday and she told her she had a buildup of wax in the right ear    HPI Patient is in today for decreased hearing on the right due to cerumen impaction.  No other symptoms.   Health Maintenance Due  Topic Date Due   DEXA SCAN  Never done   TETANUS/TDAP  06/17/2020   COVID-19 Vaccine (4 - Pfizer risk series) 05/02/2021   Pneumonia Vaccine 52+ Years old (2 - PCV) 11/20/2021    Past Medical History:  Diagnosis Date   Allergy    seasonal   Anxiety    Arthritis    Asthma    Complication of anesthesia    headache after neck surgery   Ehlers-Danlos syndrome type III    Fatty liver    Fibromyalgia    GERD (gastroesophageal reflux disease)    history of   Heart murmur    History of blood in urine    History of bronchitis    History of cholelithiasis    History of kidney stones    Hyperlipemia    Hyperparathyroidism ([place])    Hypertension    no medication needed since bariatric surgery   Insomnia    Leg pain    Low back pain    Obese    history of   Pneumonia    history of    Pre-diabetes    no since weight loss   Sleep apnea    improved since weight loss    Past Surgical History:  Procedure Laterality Date   achilles tendon tibial tendon fusion     4 surgeries   BREAST BIOPSY Right    BREAST BIOPSY Left    BREAST BIOPSY Left    CERVICAL DISCECTOMY     2010   CESAREAN SECTION     CHOLECYSTECTOMY     COLONOSCOPY     CYSTOSCOPY WITH RETROGRADE PYELOGRAM, URETEROSCOPY AND STENT PLACEMENT Right 08/01/2017   Procedure: CYSTOSCOPY WITH RETROGRADE PYELOGRAM, URETEROSCOPY AND STENT PLACEMENT;  Surgeon: Alexis Frock, MD;  Location: [facility];  Service: Urology;  Laterality: Right;   HOLMIUM LASER APPLICATION Right [id number]   Procedure: HOLMIUM LASER  APPLICATION;  Surgeon: Alexis Frock, MD;  Location: [facility];  Service: Urology;  Laterality: Right;   JOINT REPLACEMENT Right    partial joint replacement knee   LAPAROSCOPIC GASTRIC RESTRICTIVE DUODENAL PROCEDURE (DUODENAL SWITCH)     2017   lower back     2008   spinal injections     SPINE SURGERY     L4-L5, C6-C7     Family History  Problem Relation Age of Onset   Parkinson's disease Mother        Deceased, 29   Heart disease Father        Deceased, 58   Pancreatic cancer Sister        Survivor   Healthy Daughter    Colon cancer Neg Hx    Colon polyps Neg Hx    Esophageal cancer Neg Hx    Rectal cancer Neg Hx    Stomach cancer Neg Hx    Breast cancer Neg Hx     Social History   Socioeconomic History   Marital status: Married    Spouse name: Not  on file   Number of children: Not on file   Years of education: Not on file   Highest education level: Not on file  Occupational History   Not on file  Tobacco Use   Smoking status: Never   Smokeless tobacco: Never  Vaping Use   Vaping Use: Never used  Substance and Sexual Activity   Alcohol use: Yes    Alcohol/week: 0.0 standard drinks of alcohol    Comment: rare   Drug use: No   Sexual activity: Not on file  Other Topics Concern   Not on file  Social History Narrative   Lives with husband in a 2 story home.  Has 1 daughter.     On disability in 2009.  Used to work as an Insurance underwriter at [employer].      Social Determinants of Health   Financial Resource Strain: Low Risk  (11/27/2021)   Overall Financial Resource Strain (CARDIA)    Difficulty of Paying Living Expenses: Not hard at all  Food Insecurity: No Food Insecurity (11/27/2021)   Hunger Vital Sign    Worried About Running Out of Food in the Last Year: Never true    Ran Out of Food in the Last Year: Never true  Transportation Needs: No Transportation Needs (11/27/2021)   PRAPARE - Hydrologist (Medical):  No    Lack of Transportation (Non-Medical): No  Physical Activity: Sufficiently Active (11/27/2021)   Exercise Vital Sign    Days of Exercise per Week: 5 days    Minutes of Exercise per Session: 30 min  Stress: No Stress Concern Present (11/27/2021)   [place]    Feeling of Stress : Not at all  Social Connections: Moderately Isolated (11/27/2021)   Social Connection and Isolation Panel [NHANES]    Frequency of Communication with Friends and Family: More than three times a week    Frequency of Social Gatherings with Friends and Family: More than three times a week    Attends Religious Services: Never    Marine scientist or Organizations: No    Attends Archivist Meetings: Never    Marital Status: Married  Human resources officer Violence: Not At Risk (11/27/2021)   Humiliation, Afraid, Rape, and Kick questionnaire    Fear of Current or Ex-Partner: No    Emotionally Abused: No    Physically Abused: No    Sexually Abused: No    Outpatient Medications Prior to Visit  Medication Sig Dispense Refill   albuterol (VENTOLIN HFA) 108 (90 Base) MCG/ACT inhaler Inhale 2 puffs into the lungs every 6 (six) hours as needed for wheezing. 6.7 g 3   ALPRAZolam (XANAX) 0.5 MG tablet TAKE 1 TABLET BY MOUTH 2 TIMES DAILY AS NEEDED FOR ANXIETY 60 tablet 5   amLODipine (NORVASC) 10 MG tablet TAKE 1 TABLET (10 MG TOTAL) BY MOUTH DAILY. 90 tablet 3   aspirin EC 81 MG tablet Take 1 tablet (81 mg total) by mouth daily. Swallow whole. 90 tablet 3   atorvastatin (LIPITOR) 40 MG tablet Take 1 tablet (40 mg total) by mouth daily. 90 tablet 3   fexofenadine (ALLEGRA) 180 MG tablet Take 180 mg by mouth daily.     Multiple Vitamins-Minerals (BARIATRIC MULTIVITAMINS/IRON PO) Take 3 tablets by mouth daily.      potassium chloride SA (KLOR-CON) 20 MEQ tablet Take 1 tablet (20 mEq total) by mouth daily. 30 tablet 1   solifenacin (  VESICARE) 10 MG  tablet Take 1 tablet (10 mg total) by mouth daily. Overdue for Annual appt must see provider for future refills 30 tablet 0   topiramate (TOPAMAX) 50 MG tablet Take 1 tablet (50 mg total) by mouth daily. 30 tablet 3   Venlafaxine HCl 225 MG TB24 Take 1 tablet (225 mg total) by mouth daily. 90 tablet 3   nitroGLYCERIN (NITROSTAT) 0.4 MG SL tablet Place 1 tablet (0.4 mg total) under the tongue every 5 (five) minutes as needed for chest pain. 25 tablet 2   No facility-administered medications prior to visit.    No Known Allergies  ROS     Objective:    Physical Exam  BP 134/86 (BP Location: Left Arm, Patient Position: Sitting, Cuff Size: Large)   Pulse 88   Temp (!) 97.5 F (36.4 C) (Temporal)   Ht 5' 0.5" (1.537 m)   Wt 192 lb (87.1 kg)   SpO2 97%   BMI 36.88 kg/m  Wt Readings from Last 3 Encounters:  03/06/22 192 lb (87.1 kg)  03/05/22 191 lb (86.6 kg)  11/27/21 186 lb 3.2 oz (84.5 kg)   Alert and in no distress. Right ear canal with cerumen impaction on initial exam. Tympanic membranes and canals are normal post ear lavage. Hearing normal.  Pharyngeal area is normal.      Assessment & Plan:   Problem List Items Addressed This Visit   None Visit Diagnoses     Hearing loss of right ear due to cerumen impaction    -  Primary      Right ear with cerumen impaction and decreased hearing.  Verbal consent obtained and CMA performed ear lavage.  Patient tolerated this well and reported improvement in symptoms immediately following disimpaction.  Right TM normal post lavage.  I am having [name] [name] maintain her Multiple Vitamins-Minerals (BARIATRIC MULTIVITAMINS/IRON PO), fexofenadine, potassium chloride SA, aspirin EC, albuterol, nitroGLYCERIN, ALPRAZolam, solifenacin, amLODipine, atorvastatin, Venlafaxine HCl, and topiramate.  No orders of the defined types were placed in this encounter.

## 2022-03-07 DIAGNOSIS — F900 Attention-deficit hyperactivity disorder, predominantly inattentive type: Secondary | ICD-10-CM | POA: Diagnosis not present

## 2022-03-08 ENCOUNTER — Other Ambulatory Visit: Payer: Self-pay | Admitting: Internal Medicine

## 2022-03-08 NOTE — Assessment & Plan Note (Signed)
Recent labs stable and needs DEXA monitoring every 2-3 years.

## 2022-03-08 NOTE — Assessment & Plan Note (Signed)
Flu shot given. Covid-19 counseled. Pneumonia had 23. Shingrix complete. Tetanus due at pharmacy. Colonoscopy up to date. Mammogram up to date, pap smear aged out and dexa up to date. Counseled about sun safety and mole surveillance. Counseled about the dangers of distracted driving. Given 10 year screening recommendations.

## 2022-03-08 NOTE — Assessment & Plan Note (Signed)
Uses albuterol prn and no flare today. Will continue and can refill as needed.

## 2022-03-08 NOTE — Assessment & Plan Note (Signed)
Recent lipids at goal on lipitor 40 mg daily. Continue and refill as needed.

## 2022-03-08 NOTE — Assessment & Plan Note (Signed)
BP at goal on amlodipine 10 mg daily. Recent labs stable.

## 2022-03-08 NOTE — Assessment & Plan Note (Signed)
BMI 36 and complicated by hypertension and hyperlipidemia. Rx topiramate to help with weight loss.

## 2022-03-11 DIAGNOSIS — F900 Attention-deficit hyperactivity disorder, predominantly inattentive type: Secondary | ICD-10-CM | POA: Diagnosis not present

## 2022-03-14 ENCOUNTER — Ambulatory Visit: Payer: Medicare PPO | Attending: Internal Medicine | Admitting: Internal Medicine

## 2022-03-14 ENCOUNTER — Encounter: Payer: Self-pay | Admitting: Internal Medicine

## 2022-03-14 ENCOUNTER — Telehealth: Payer: Self-pay | Admitting: *Deleted

## 2022-03-14 VITALS — BP 122/78 | HR 90 | Ht 60.5 in | Wt 193.2 lb

## 2022-03-14 DIAGNOSIS — R0609 Other forms of dyspnea: Secondary | ICD-10-CM

## 2022-03-14 DIAGNOSIS — E785 Hyperlipidemia, unspecified: Secondary | ICD-10-CM | POA: Diagnosis not present

## 2022-03-14 DIAGNOSIS — F900 Attention-deficit hyperactivity disorder, predominantly inattentive type: Secondary | ICD-10-CM | POA: Diagnosis not present

## 2022-03-14 DIAGNOSIS — I10 Essential (primary) hypertension: Secondary | ICD-10-CM

## 2022-03-14 DIAGNOSIS — E669 Obesity, unspecified: Secondary | ICD-10-CM | POA: Diagnosis not present

## 2022-03-14 DIAGNOSIS — G4733 Obstructive sleep apnea (adult) (pediatric): Secondary | ICD-10-CM

## 2022-03-14 DIAGNOSIS — I2510 Atherosclerotic heart disease of native coronary artery without angina pectoris: Secondary | ICD-10-CM | POA: Diagnosis not present

## 2022-03-14 DIAGNOSIS — Q796 Ehlers-Danlos syndrome, unspecified: Secondary | ICD-10-CM | POA: Diagnosis not present

## 2022-03-14 NOTE — Telephone Encounter (Signed)
Prior Authorization for split night sleep study sent to [facility] via web portal. Tracking Number [id number].

## 2022-03-14 NOTE — Progress Notes (Signed)
Cardiology Office Note:    Date:  03/14/2022  ID:  [name], DOB Nov 06, 1955, MRN [id number]  PCP:  Hoyt Koch, MD  Cardiologist:  Elouise Munroe, MD  Electrophysiologist:  None   Referring MD: Hoyt Koch, *   Chief Complaint/Reason for Referral: Chest pain  History of Present Illness:    [name] [name] is a 66 y.o. female with a history of Ehlers-Danlos syndrome type III, fatty liver, fibromyalgia, hypertension (resolved upon weight loss), hyperlipidemia, prediabetes and history of obesity s/p bariatric surgery in 2017. Symptoms seem most consistent with esophageal spasm after food was stuck briefly in her esophagus by subjective report on hospital presentation. Due to CAC on CTPE, Q waves on ECG and risk factors, we determined in shared decision making to perform CCTA as an outpatient, showing moderate, nonobstructive CAD. We discussed medical management of CAD.  At her last appointment she was doing well aside from some DOE which she felt was caused by deconditioning. We discussed exercise and diet recommendations.  Today, she reports that all summer she had been feeling "like a slug". She had 2 naps a day and generally felt fatigued. Mostly, she complains of an occasional "twinge" of pain inferior to her left breast, and sometimes aches in her left arm as well. She feels worn out during exertion or frequent moving/shifting while she is reclining on a sofa or bed.   In the last couple weeks she has noticed a drop in her blood pressures at home. The only recent changes she can recall is stopping her Vesicare medication. BP is now normal, when usually it is elevated.  She has been having trouble while sleeping. She does snore, but has not been using her CPAP machine consistently for about 5 years. She doesn't use her CPAP machine because it is about 68 years old, it feels uncomfortable with it being large, and because she has to get out of the bed often to use  the bathroom. She has been using Vesicare which seems to help a bit. She ran out 2 weeks ago but has not noticed much of a difference while off of Vesicare.  Additionally she confirms recurring esophageal spasms that are "not uncommon." Since her last visit she had 1 notable spasm prompting her to take a nitroglycerin. However, this was mostly ineffective.  She has mild bilateral LE edema and is open to trying compression socks to help.   Recent lab results in May from [facility] shows LDL 73, HDL 65, and triglycerides 126. Thyroid function was normal.  She denies any palpitations. No lightheadedness, headaches, syncope, orthopnea, or PND.    Past Medical History:  Diagnosis Date   Allergy    seasonal   Anxiety    Arthritis    Asthma    Complication of anesthesia    headache after neck surgery   Ehlers-Danlos syndrome type III    Fatty liver    Fibromyalgia    GERD (gastroesophageal reflux disease)    history of   Heart murmur    History of blood in urine    History of bronchitis    History of cholelithiasis    History of kidney stones    Hyperlipemia    Hyperparathyroidism ([place])    Hypertension    no medication needed since bariatric surgery   Insomnia    Leg pain    Low back pain    Obese    history of   Pneumonia    history  of    Pre-diabetes    no since weight loss   Sleep apnea    improved since weight loss    Past Surgical History:  Procedure Laterality Date   achilles tendon tibial tendon fusion     4 surgeries   BREAST BIOPSY Right    BREAST BIOPSY Left    BREAST BIOPSY Left    CERVICAL DISCECTOMY     2010   CESAREAN SECTION     CHOLECYSTECTOMY     COLONOSCOPY     CYSTOSCOPY WITH RETROGRADE PYELOGRAM, URETEROSCOPY AND STENT PLACEMENT Right 08/01/2017   Procedure: CYSTOSCOPY WITH RETROGRADE PYELOGRAM, URETEROSCOPY AND STENT PLACEMENT;  Surgeon: Sebastian Ache, MD;  [facility];  Service: Urology;  Laterality: Right;   HOLMIUM  LASER APPLICATION Right 08/01/2017   Procedure: HOLMIUM LASER APPLICATION;  Surgeon: Sebastian Ache, MD;  Location: [facility];  Service: Urology;  Laterality: Right;   JOINT REPLACEMENT Right    partial joint replacement knee   LAPAROSCOPIC GASTRIC RESTRICTIVE DUODENAL PROCEDURE (DUODENAL SWITCH)     2017   lower back     2008   spinal injections     SPINE SURGERY     L4-L5, C6-C7     Current Medications: Current Meds  Medication Sig   albuterol (VENTOLIN HFA) 108 (90 Base) MCG/ACT inhaler Inhale 2 puffs into the lungs every 6 (six) hours as needed for wheezing.   ALPRAZolam (XANAX) 0.5 MG tablet TAKE 1 TABLET BY MOUTH 2 TIMES DAILY AS NEEDED FOR ANXIETY   amLODipine (NORVASC) 10 MG tablet TAKE 1 TABLET (10 MG TOTAL) BY MOUTH DAILY.   aspirin EC 81 MG tablet Take 1 tablet (81 mg total) by mouth daily. Swallow whole.   atorvastatin (LIPITOR) 40 MG tablet Take 1 tablet (40 mg total) by mouth daily.   fexofenadine (ALLEGRA) 180 MG tablet Take 180 mg by mouth daily.   Multiple Vitamins-Minerals (BARIATRIC MULTIVITAMINS/IRON PO) Take 3 tablets by mouth daily.    nitroGLYCERIN (NITROSTAT) 0.4 MG SL tablet Place 1 tablet (0.4 mg total) under the tongue every 5 (five) minutes as needed for chest pain.   potassium chloride SA (KLOR-CON) 20 MEQ tablet Take 1 tablet (20 mEq total) by mouth daily.   solifenacin (VESICARE) 10 MG tablet Take 1 tablet (10 mg total) by mouth daily. Overdue for Annual appt must see provider for future refills   topiramate (TOPAMAX) 50 MG tablet Take 1 tablet (50 mg total) by mouth daily.   Venlafaxine HCl 225 MG TB24 Take 1 tablet (225 mg total) by mouth daily.     Allergies:   Patient has no known allergies.   Social History   Tobacco Use   Smoking status: Never   Smokeless tobacco: Never  Vaping Use   Vaping Use: Never used  Substance Use Topics   Alcohol use: Yes    Alcohol/week: 0.0 standard drinks of alcohol    Comment: rare   Drug  use: No     Family History: The patient's family history includes Healthy in her daughter; Heart disease in her father; Pancreatic cancer in her sister; Parkinson's disease in her mother. There is no history of Colon cancer, Colon polyps, Esophageal cancer, Rectal cancer, Stomach cancer, or Breast cancer.  ROS:   Please see the history of present illness.  (+) Left chest pain (+) Insomnia (+) Shortness of breath (+) Bilateral LE edema  All other systems reviewed and are negative.  EKGs/Labs/Other Studies Reviewed:  The following studies were reviewed today:  Echo 03/30/2021:  IMPRESSIONS   1. Left ventricular ejection fraction, by estimation, is 65 to 70%. Left  ventricular ejection fraction by 3D volume is 68 %. The left ventricle has  normal function. The left ventricle has no regional wall motion  abnormalities. Left ventricular diastolic   parameters were normal.   2. Right ventricular systolic function is normal. The right ventricular  size is normal. Tricuspid regurgitation signal is inadequate for assessing  PA pressure.   3. The mitral valve is grossly normal. Trivial mitral valve  regurgitation. No evidence of mitral stenosis.   4. The aortic valve was not well visualized. Aortic valve regurgitation  is not visualized. No aortic stenosis is present.   Comparison(s): A prior study was performed on 03/09/2007. No significant  change from prior study.   Coronary CTA 12/08/2019: IMPRESSION: 1. Moderate CAD in proximal LAD, CADRADS = 3. CT FFR will be performed and reported separately.   2. Coronary calcium score is 62, which places the patient in the 78th percentile for age and sex matched control.   3. Normal coronary origin with right dominance.  CT FFR Analysis 12/08/2019:  FINDINGS: FFRct analysis was performed on the original cardiac CT angiogram dataset. Diagrammatic representation of the FFRct analysis is provided in a separate PDF document in PACS. This  dictation was created using the PDF document and an interactive 3D model of the results. 3D model is not available in the EMR/PACS. Normal FFR range is >0.80. Indeterminate (grey) zone is 0.76-0.80.   1. Left Main: FFR = 0.98   2. LAD: Proximal FFR = 0.93, Mid FFR = 0.90, Distal FFR = 0.83 3. LCX: Proximal FFR = 0.97, Distal FFR = 0.94 OM FFR = 0.87 4. RCA: Proximal FFR = 0.95, Mid FFR =0.90, Distal FFR = 0.89   IMPRESSION: 1.  CT FFR analysis showed no significant stenosis.   RECOMMENDATIONS: Goal directed medical therapy and aggressive risk factor modification for secondary prevention of coronary artery disease  EKG:  EKG is personally reviewed. 03/14/2022: Sinus rhythm. Inferior and anterior infarct pattern.  09/13/2021: NSR, inf and ant infarct pattern   Recent Labs: No results found for requested labs within last 365 days.  Recent Lipid Panel    Component Value Date/Time   CHOL 142 11/20/2020 1041   TRIG 74.0 11/20/2020 1041   HDL 65.30 11/20/2020 1041   CHOLHDL 2 11/20/2020 1041   VLDL 14.8 11/20/2020 1041   LDLCALC 62 11/20/2020 1041    Physical Exam:    VS:  BP 122/78 (BP Location: Right Arm, Patient Position: Sitting, Cuff Size: Large)   Pulse 90   Ht 5' 0.5" (1.537 m)   Wt 193 lb 3.2 oz (87.6 kg)   SpO2 96%   BMI 37.11 kg/m     Wt Readings from Last 5 Encounters:  03/14/22 193 lb 3.2 oz (87.6 kg)  03/06/22 192 lb (87.1 kg)  03/05/22 191 lb (86.6 kg)  11/27/21 186 lb 3.2 oz (84.5 kg)  09/13/21 187 lb 9.6 oz (85.1 kg)    Constitutional: No acute distress Eyes: sclera non-icteric, normal conjunctiva and lids ENMT: normal dentition, moist mucous membranes Cardiovascular: regular rhythm, normal rate, no murmurs. S1 and S2 normal. Radial pulses normal bilaterally. No jugular venous distention.  Respiratory: clear to auscultation bilaterally GI : normal bowel sounds, soft and nontender. No distention.   MSK: extremities warm, well perfused. No edema.   NEURO: grossly nonfocal exam, moves all  extremities. PSYCH: alert and oriented x 3, normal mood and affect.   ASSESSMENT:    1. Coronary artery disease involving native coronary artery of native heart without angina pectoris   2. OSA (obstructive sleep apnea)   3. Primary hypertension   4. Hyperlipidemia, unspecified hyperlipidemia type   5. Ehlers-Danlos syndrome   6. Dyspnea on exertion   7. Obesity (BMI 35.0-39.9 without comorbidity)      PLAN:    Coronary artery disease involving native coronary artery of native heart without angina pectoris - Plan: EKG 12-Lead - continue ASA 81 mg daily and statin - sublingual nitro for chest pain, likely esophageal spasm. - discussed medical management of CAD today. Fortunately risk factors are mostly optimized.  Essential hypertension - Plan: EKG 12-Lead -BP stable, continue amlodipine 10 mg daily, BP has improved lately.   HLD - continue atorvastatin 40 mg daily. Last labs 6/22 - LDL 62, Trig 74, HDL 65.  Ehlers-Danlos syndrome - no vascular ectasias noted in visualized portions of CCTA. No mitral valve prolapse, mild MR.  DOE Obesity  OSA Fatigue - suspect her symptoms are due to untreated sleep apnea. Will arrange for a sleep study to reassess CPAP needs. Known diagnosis of OSA.   Total time of encounter: 30 minutes total time of encounter, including 20 minutes spent in face-to-face patient care on the date of this encounter. This time includes coordination of care and counseling regarding above mentioned problem list. Remainder of non-face-to-face time involved reviewing chart documents/testing relevant to the patient encounter and documentation in the medical record. I have independently reviewed documentation from referring provider.   Weston Brass, MD, [facility]  [facility]    Medication Adjustments/Labs and Tests Ordered: Current medicines are reviewed at length with the patient today.  Concerns regarding  medicines are outlined above.   Orders Placed This Encounter  Procedures   EKG 12-Lead   Split night study   No orders of the defined types were placed in this encounter.  Patient Instructions  Medication Instructions:  No Changes In Medications at this time.  *If you need a refill on your cardiac medications before your next appointment, please call your pharmacy*  TESTING: SPLIT NIGHT SLEEP STUDY- SOMEONE WILL REACH OUT TO YOU REGARDING THIS   Follow-Up: At [facility], you and your health needs are our priority.  As part of our continuing mission to provide you with exceptional heart care, we have created designated Provider Care Teams.  These Care Teams include your primary Cardiologist (physician) and Advanced Practice Providers (APPs -  Physician Assistants and Nurse Practitioners) who all work together to provide you with the care you need, when you need it.  Your next appointment:   6 month(s)  The format for your next appointment:   In Person  Provider:   Parke Poisson, MD               [facility] Stumpf,acting as a scribe for Parke Poisson, MD.,have documented all relevant documentation on the behalf of Parke Poisson, MD,as directed by  Parke Poisson, MD while in the presence of Parke Poisson, MD.   I, Parke Poisson, MD, have reviewed all documentation for this visit. The documentation on 03/14/22 for the exam, diagnosis, procedures, and orders are all accurate and complete.

## 2022-03-14 NOTE — Patient Instructions (Addendum)
Medication Instructions:  No Changes In Medications at this time.  *If you need a refill on your cardiac medications before your next appointment, please call your pharmacy*  TESTING: SPLIT NIGHT SLEEP STUDY- SOMEONE WILL REACH OUT TO YOU REGARDING THIS   Follow-Up: At [facility], you and your health needs are our priority.  As part of our continuing mission to provide you with exceptional heart care, we have created designated Provider Care Teams.  These Care Teams include your primary Cardiologist (physician) and Advanced Practice Providers (APPs -  Physician Assistants and Nurse Practitioners) who all work together to provide you with the care you need, when you need it.  Your next appointment:   6 month(s)  The format for your next appointment:   In Person  Provider:   Elouise Munroe, MD

## 2022-03-17 DIAGNOSIS — S52121A Displaced fracture of head of right radius, initial encounter for closed fracture: Secondary | ICD-10-CM | POA: Diagnosis not present

## 2022-03-18 ENCOUNTER — Telehealth: Payer: Self-pay | Admitting: Internal Medicine

## 2022-03-18 NOTE — Telephone Encounter (Signed)
Last OV--03/06/22. Please advise

## 2022-03-18 NOTE — Telephone Encounter (Signed)
Patient was here on 03/05/2022 - she was supposed to get refills on her old medications but they didn't get called in:  Alpraxolam & generic vesicare  & Norvasc - Please send to [place], [place].

## 2022-03-19 DIAGNOSIS — F900 Attention-deficit hyperactivity disorder, predominantly inattentive type: Secondary | ICD-10-CM | POA: Diagnosis not present

## 2022-03-19 MED ORDER — ALPRAZOLAM 0.5 MG PO TABS: 0.5000 mg | ORAL_TABLET | Freq: Two times a day (BID) | ORAL | 5 refills | Status: DC | PRN

## 2022-03-19 NOTE — Telephone Encounter (Signed)
PA received from [facility]. Auth # [id number]. Valid dates 03/25/22 to 06/23/22.

## 2022-03-19 NOTE — Telephone Encounter (Signed)
Notified pt regarding medication

## 2022-03-19 NOTE — Telephone Encounter (Signed)
Sent in alprazolam. It appears amlodipine was sent in 1 year supply on 02/20/22 and should not need refill yet.

## 2022-03-22 DIAGNOSIS — S52124A Nondisplaced fracture of head of right radius, initial encounter for closed fracture: Secondary | ICD-10-CM | POA: Diagnosis not present

## 2022-03-28 DIAGNOSIS — F900 Attention-deficit hyperactivity disorder, predominantly inattentive type: Secondary | ICD-10-CM | POA: Diagnosis not present

## 2022-04-04 DIAGNOSIS — F900 Attention-deficit hyperactivity disorder, predominantly inattentive type: Secondary | ICD-10-CM | POA: Diagnosis not present

## 2022-04-05 DIAGNOSIS — S52124A Nondisplaced fracture of head of right radius, initial encounter for closed fracture: Secondary | ICD-10-CM | POA: Diagnosis not present

## 2022-04-11 DIAGNOSIS — F900 Attention-deficit hyperactivity disorder, predominantly inattentive type: Secondary | ICD-10-CM | POA: Diagnosis not present

## 2022-04-18 DIAGNOSIS — F900 Attention-deficit hyperactivity disorder, predominantly inattentive type: Secondary | ICD-10-CM | POA: Diagnosis not present

## 2022-04-25 DIAGNOSIS — F900 Attention-deficit hyperactivity disorder, predominantly inattentive type: Secondary | ICD-10-CM | POA: Diagnosis not present

## 2022-04-26 DIAGNOSIS — S52124A Nondisplaced fracture of head of right radius, initial encounter for closed fracture: Secondary | ICD-10-CM | POA: Diagnosis not present

## 2022-04-29 DIAGNOSIS — F900 Attention-deficit hyperactivity disorder, predominantly inattentive type: Secondary | ICD-10-CM | POA: Diagnosis not present

## 2022-05-01 ENCOUNTER — Other Ambulatory Visit (HOSPITAL_BASED_OUTPATIENT_CLINIC_OR_DEPARTMENT_OTHER): Payer: Self-pay | Admitting: Radiology

## 2022-05-03 DIAGNOSIS — Z1321 Encounter for screening for nutritional disorder: Secondary | ICD-10-CM | POA: Diagnosis not present

## 2022-05-03 DIAGNOSIS — E559 Vitamin D deficiency, unspecified: Secondary | ICD-10-CM | POA: Diagnosis not present

## 2022-05-03 DIAGNOSIS — Z1329 Encounter for screening for other suspected endocrine disorder: Secondary | ICD-10-CM | POA: Diagnosis not present

## 2022-05-03 DIAGNOSIS — M8580 Other specified disorders of bone density and structure, unspecified site: Secondary | ICD-10-CM | POA: Diagnosis not present

## 2022-05-06 ENCOUNTER — Ambulatory Visit (HOSPITAL_BASED_OUTPATIENT_CLINIC_OR_DEPARTMENT_OTHER): Payer: Medicare PPO | Attending: Internal Medicine | Admitting: Cardiology

## 2022-05-06 VITALS — Ht 61.0 in | Wt 185.0 lb

## 2022-05-06 DIAGNOSIS — G4719 Other hypersomnia: Secondary | ICD-10-CM

## 2022-05-06 DIAGNOSIS — R0683 Snoring: Secondary | ICD-10-CM

## 2022-05-06 DIAGNOSIS — G4733 Obstructive sleep apnea (adult) (pediatric): Secondary | ICD-10-CM | POA: Diagnosis not present

## 2022-05-06 DIAGNOSIS — I2510 Atherosclerotic heart disease of native coronary artery without angina pectoris: Secondary | ICD-10-CM | POA: Diagnosis not present

## 2022-05-07 DIAGNOSIS — F900 Attention-deficit hyperactivity disorder, predominantly inattentive type: Secondary | ICD-10-CM | POA: Diagnosis not present

## 2022-05-13 NOTE — Procedures (Signed)
   Patient Name: Evella, Kasal Date:05/06/2022 Gender: Female D.O.B: Nov 26, 1955 Age (years): 54 Referring Provider: Weston Brass MD Height (inches): 61 Interpreting Physician: Armanda Magic MD, ABSM Weight (lbs): 185 RPSGT: Armen Pickup BMI: 35 MRN: [id number] Neck Size: 14.00  CLINICAL INFORMATION Sleep Study Type: NPSG  Indication for sleep study: Obesity, OSA, Snoring  Epworth Sleepiness Score: 19  SLEEP STUDY TECHNIQUE As per the AASM Manual for the Scoring of Sleep and Associated Events v2.3 (April 2016) with a hypopnea requiring 4% desaturations.  The channels recorded and monitored were frontal, central and occipital EEG, electrooculogram (EOG), submentalis EMG (chin), nasal and oral airflow, thoracic and abdominal wall motion, anterior tibialis EMG, snore microphone, electrocardiogram, and pulse oximetry.  MEDICATIONS Medications self-administered by patient taken the night of the study : XANAX  SLEEP ARCHITECTURE The study was initiated at 10:47:18 PM and ended at 4:29:27 AM.  Sleep onset time was 37.7 minutes and the sleep efficiency was 83.6%. The total sleep time was 286 minutes.  Stage REM latency was N/A minutes.  The patient spent 5.1% of the night in stage N1 sleep, 94.9% in stage N2 sleep, 0.0% in stage N3 and 0% in REM.  Alpha intrusion was absent.  Supine sleep was 4.18%.  RESPIRATORY PARAMETERS The overall apnea/hypopnea index (AHI) was 2.3 per hour. There were 2 total apneas, including 2 obstructive, 0 central and 0 mixed apneas. There were 9 hypopneas and 2 RERAs.  The AHI during Stage REM sleep was N/A per hour.  AHI while supine was 5.0 per hour.  The mean oxygen saturation was 92.4%. The minimum SpO2 during sleep was 84.0%.  moderate snoring was noted during this study.  CARDIAC DATA The 2 lead EKG demonstrated sinus rhythm. The mean heart rate was 83.5 beats per minute. Other EKG findings include: None.  LEG MOVEMENT DATA The  total PLMS were 0 with a resulting PLMS index of 0.0. Associated arousal with leg movement index was 17.6 .  IMPRESSIONS - No significant obstructive sleep apnea occurred during this study (AHI = 2.3/h). - Mild oxygen desaturation was noted during this study (Min O2 = 84.0%). - The patient snored with moderate snoring volume. - No cardiac abnormalities were noted during this study. - Clinically significant periodic limb movements did not occur during sleep. Associated arousals were significant with intermittent leg movements with high leg movement index.  DIAGNOSIS - Normal Study for sleep disordered breathgin - Frequent leg movements during sleep associated with arousals  RECOMMENDATIONS - Avoid alcohol, sedatives and other CNS depressants that may worsen sleep apnea and disrupt normal sleep architecture. - Sleep hygiene should be reviewed to assess factors that may improve sleep quality. - Weight management and regular exercise should be initiated or continued if appropriate. - Recommend screening for symptoms of restless legs.  [Electronically signed] 05/13/2022 12:36 PM  Armanda Magic MD, ABSM Diplomate, American Board of Sleep Medicine

## 2022-05-14 ENCOUNTER — Telehealth: Payer: Self-pay

## 2022-05-14 DIAGNOSIS — G2581 Restless legs syndrome: Secondary | ICD-10-CM

## 2022-05-14 NOTE — Telephone Encounter (Signed)
-----   Message from Parke Poisson, MD sent at 05/14/2022  9:51 AM EST ----- Please offer the patient a referral to neurology for restless leg which may be contributing to disordered sleep pattern. Thanks, GA ----- Message ----- From: Rosalee Kaufman, RPH-CPP Sent: 05/14/2022   9:33 AM EST To: Parke Poisson, MD     ----- Message ----- From: Parke Poisson, MD Sent: 05/13/2022   1:54 PM EST To: Rosalee Kaufman, RPH-CPP; #  Anything we can do for restless leg from medication review standpoint? If not I may consider neurology referral. ----- Message ----- From: Quintella Reichert, MD Sent: 05/13/2022  12:39 PM EST To: Parke Poisson, MD  Patient has no OSA but had significant leg movements during sleep with associated arousals that may be causing fragmented sleep and daytime sleepiness.  Would screen for symptoms of restless legs and review meds for possible medications that could cause restless legs

## 2022-05-14 NOTE — Telephone Encounter (Signed)
Attempted to call patient, left message for patient to call back to office.   

## 2022-05-14 NOTE — Progress Notes (Signed)
There is a possibility that topiramate can cause restless leg syndrome, but don't see concerns with her other medications.  Might be best to refer to neurology, they can better determine if that is the cause.  Otherwise I usually recommend trying calcium/magnesium/potassium combination to see if that will ease the restless legs.

## 2022-05-16 ENCOUNTER — Telehealth: Payer: Self-pay | Admitting: *Deleted

## 2022-05-16 NOTE — Telephone Encounter (Signed)
The patient has been notified of the result. Left detailed message on voicemail and informed patient to call back..Tex Conroy Green, CMA   

## 2022-05-16 NOTE — Telephone Encounter (Signed)
-----   Message from Gaynelle Cage, [place] sent at 05/13/2022  1:52 PM EST -----  ----- Message ----- From: Quintella Reichert, MD Sent: 05/13/2022  12:38 PM EST To: [facility] Sleep Studies  Please let patient know that sleep study showed no significant sleep apnea.  There were significant leg movements and patient should be screened for possible restless leg syndrome by ordering provider

## 2022-05-24 ENCOUNTER — Other Ambulatory Visit: Payer: Self-pay | Admitting: Internal Medicine

## 2022-05-30 NOTE — Telephone Encounter (Signed)
Returned call to patient and made patient referral to Neurology for restless leg. Patient requested Dr. Allena [name] at [facility]. Referral sent over.   Patient would also like someone from sleep studies to call her to go over her sleep results. Advised patient I would forward message over. Patient verbalized understanding.

## 2022-05-30 NOTE — Addendum Note (Signed)
Addended by: Bea Laura B on: 05/30/2022 02:48 PM   Modules accepted: Orders

## 2022-05-31 ENCOUNTER — Encounter: Payer: Self-pay | Admitting: Neurology

## 2022-07-23 ENCOUNTER — Ambulatory Visit: Payer: Medicare PPO | Admitting: Neurology

## 2022-08-14 DIAGNOSIS — E559 Vitamin D deficiency, unspecified: Secondary | ICD-10-CM | POA: Diagnosis not present

## 2022-08-20 ENCOUNTER — Encounter: Payer: Self-pay | Admitting: Neurology

## 2022-08-20 ENCOUNTER — Ambulatory Visit: Payer: Medicare PPO | Admitting: Neurology

## 2022-08-20 VITALS — BP 134/81 | HR 88 | Ht 61.0 in | Wt 196.0 lb

## 2022-08-20 DIAGNOSIS — G4761 Periodic limb movement disorder: Secondary | ICD-10-CM | POA: Diagnosis not present

## 2022-08-20 NOTE — Progress Notes (Signed)
[facility] Clinic Note - Initial Visit   Date: 08/20/2022   [name] [name] MRN: [id number] DOB: Jun 04, 1956   Dear Dr. Margaretann Loveless:  Thank you for your kind referral of [name] [name] for consultation of RLS. Although her history is well known to you, please allow [place] to reiterate it for the purpose of our medical record. The patient was accompanied to the clinic by self.     [name] [name] is a 67 y.o. right-handed female with hypertension, hyperlipidemia, depression, Ehler's Danlos syndrome type III, and history of L4-5 discectomy and decompression of right L4 nerve root and cervical diskectomy and fusion at C6-7, and anxiety/depression presenting for evaluation of restless leg syndrome.   IMPRESSION/PLAN: Periodic limb movement disorder.  Chronic.  She denies restless sensation or difficulty with sleeping because of the urge to movement the legs making restless leg syndrome unlikely.  Benzodiazepine is treatment of choice for periodic limb movements and she is already taking xanax 0.'5mg'$  at bedtime.    Patient was reassured that I did not see any signs of parkinson's such as bradykinesia, rigidity, or tremor.  Should she develop any new neurological symptoms, I have asked her to return to be reassessed.   ------------------------------------------------------------- History of present illness: She had a sleep study for fatigue which showed periodic limb movement which did not occur with sleep.  There was no OSA.  There was concerns for RLS so she was referred to see me.  She denies urge to move the legs, restless sensation, or relief of symptoms with movement.  She is aware that she tends to kick at night for many years.  No associated numbness/tingling or weakness.  She uses a cane to walk for many years due to orthopeadics issues.   She endorses loss of smell and vivid night terrors.  She is concerned about Parkinson's disease, because her mother had early onset  PD and passed at the age of 50. Patient does not have tremors, except when she is painting.   Out-side paper records, electronic medical record, and images have been reviewed where available and summarized as:  Sleep study 05/06/2022: IMPRESSIONS - No significant obstructive sleep apnea occurred during this study (AHI = 2.3/h). - Mild oxygen desaturation was noted during this study (Min O2 = 84.0%). - The patient snored with moderate snoring volume. - No cardiac abnormalities were noted during this study. - Clinically significant periodic limb movements did not occur during sleep. Associated arousals were significant with intermittent leg movements with high leg movement index.   DIAGNOSIS - Normal Study for sleep disordered breathgin - Frequent leg movements during sleep associated with arousals  Lab Results  Component Value Date   HGBA1C 5.5 11/20/2020   Lab Results  Component Value Date   VITAMINB12 190 (L) 01/16/2015   Lab Results  Component Value Date   TSH 1.81 01/16/2015   No results found for: "ESRSEDRATE", "POCTSEDRATE"  Past Medical History:  Diagnosis Date   Allergy    seasonal   Anxiety    Arthritis    Asthma    Complication of anesthesia    headache after neck surgery   Ehlers-Danlos syndrome type III    Fatty liver    Fibromyalgia    GERD (gastroesophageal reflux disease)    history of   Heart murmur    History of blood in urine    History of bronchitis    History of cholelithiasis    History of kidney stones  Hyperlipemia    Hyperparathyroidism ([place])    Hypertension    no medication needed since bariatric surgery   Insomnia    Leg pain    Low back pain    Obese    history of   Pneumonia    history of    Pre-diabetes    no since weight loss   Sleep apnea    improved since weight loss    Past Surgical History:  Procedure Laterality Date   achilles tendon tibial tendon fusion     4 surgeries   BREAST BIOPSY Right    BREAST BIOPSY  Left    BREAST BIOPSY Left    CERVICAL DISCECTOMY     2010   CESAREAN SECTION     CHOLECYSTECTOMY     COLONOSCOPY     CYSTOSCOPY WITH RETROGRADE PYELOGRAM, URETEROSCOPY AND STENT PLACEMENT Right 08/01/2017   Procedure: CYSTOSCOPY WITH RETROGRADE PYELOGRAM, URETEROSCOPY AND STENT PLACEMENT;  Surgeon: Alexis Frock, MD;  Location: [facility];  Service: Urology;  Laterality: Right;   HOLMIUM LASER APPLICATION Right [id number]   Procedure: HOLMIUM LASER APPLICATION;  Surgeon: Alexis Frock, MD;  Location: [facility];  Service: Urology;  Laterality: Right;   JOINT REPLACEMENT Right    partial joint replacement knee   LAPAROSCOPIC GASTRIC RESTRICTIVE DUODENAL PROCEDURE (DUODENAL SWITCH)     2017   lower back     2008   spinal injections     SPINE SURGERY     L4-L5, C6-C7      Medications:  Outpatient Encounter Medications as of 08/20/2022  Medication Sig   albuterol (VENTOLIN HFA) 108 (90 Base) MCG/ACT inhaler Inhale 2 puffs into the lungs every 6 (six) hours as needed for wheezing.   ALPRAZolam (XANAX) 0.5 MG tablet Take 1 tablet (0.5 mg total) by mouth 2 (two) times daily as needed for anxiety.   amLODipine (NORVASC) 10 MG tablet TAKE 1 TABLET (10 MG TOTAL) BY MOUTH DAILY.   atorvastatin (LIPITOR) 40 MG tablet Take 1 tablet (40 mg total) by mouth daily.   fexofenadine (ALLEGRA) 180 MG tablet Take 180 mg by mouth daily.   Multiple Vitamins-Minerals (BARIATRIC MULTIVITAMINS/IRON PO) Take 3 tablets by mouth daily.    nitroGLYCERIN (NITROSTAT) 0.4 MG SL tablet Place 1 tablet (0.4 mg total) under the tongue every 5 (five) minutes as needed for chest pain.   potassium chloride SA (KLOR-CON) 20 MEQ tablet Take 1 tablet (20 mEq total) by mouth daily.   solifenacin (VESICARE) 10 MG tablet Take 1 tablet (10 mg total) by mouth daily.   topiramate (TOPAMAX) 50 MG tablet Take 1 tablet (50 mg total) by mouth daily.   Venlafaxine HCl 225 MG TB24 Take 1 tablet (225 mg  total) by mouth daily.   aspirin EC 81 MG tablet Take 1 tablet (81 mg total) by mouth daily. Swallow whole. (Patient not taking: Reported on 08/20/2022)   No facility-administered encounter medications on file as of 08/20/2022.    Allergies: No Known Allergies  Family History: Family History  Problem Relation Age of Onset   Parkinson's disease Mother        Deceased, 71   Heart disease Father        Deceased, 38   Pancreatic cancer Sister        Survivor   Healthy Daughter    Colon cancer Neg Hx    Colon polyps Neg Hx    Esophageal cancer Neg Hx    Rectal cancer Neg  Hx    Stomach cancer Neg Hx    Breast cancer Neg Hx     Social History: Social History   Tobacco Use   Smoking status: Never   Smokeless tobacco: Never  Vaping Use   Vaping Use: Never used  Substance Use Topics   Alcohol use: Yes    Alcohol/week: 0.0 standard drinks of alcohol    Comment: rare   Drug use: No   Social History   Social History Narrative   Lives with husband in a one story home.  Has 1 daughter.     On disability in 2009.  Used to work as an Insurance underwriter at [employer].       Vital Signs:  BP 134/81   Pulse 88   Ht '5\' 1"'$  (1.549 m)   Wt 196 lb (88.9 kg)   SpO2 95%   BMI 37.03 kg/m    Neurological Exam: MENTAL STATUS including orientation to time, place, person, recent and remote memory, attention span and concentration, language, and fund of knowledge is normal.  Speech is not dysarthric.  CRANIAL NERVES: II:  No visual field defects.     III-IV-VI: Pupils equal round and reactive to light.  Normal conjugate, extra-ocular eye movements in all directions of gaze.  No nystagmus.  No ptosis.   V:  Normal facial sensation.    VII:  Normal facial symmetry and movements.   VIII:  Normal hearing and vestibular function.   IX-X:  Normal palatal movement.   XI:  Normal shoulder shrug and head rotation.   XII:  Normal tongue strength and range of motion, no deviation or  fasciculation.  MOTOR:  Motor strength is 5/5 throughout.  No atrophy, fasciculations or abnormal movements.  No pronator drift.    MSRs:                                           Right        Left brachioradialis 2+  2+  biceps 2+  2+  triceps 2+  2+  patellar 2+  2+  ankle jerk 2+  2+  Hoffman no  no  plantar response down  down   SENSORY:  Normal and symmetric perception of light touch, pinprick, vibration, and temperature.    COORDINATION/GAIT: Normal finger-to- nose-finger.  Intact rapid alternating movements bilaterally.  Gait appears antalgic, unassisted, stable.      Thank you for allowing me to participate in patient's care.  If I can answer any additional questions, I would be pleased to do so.    Sincerely,    Luster Bress K. Posey Pronto, DO

## 2022-09-06 ENCOUNTER — Ambulatory Visit: Payer: Medicare PPO | Attending: Internal Medicine | Admitting: Internal Medicine

## 2022-09-06 ENCOUNTER — Encounter: Payer: Self-pay | Admitting: Internal Medicine

## 2022-09-06 VITALS — BP 130/76 | HR 90 | Ht 61.0 in | Wt 196.0 lb

## 2022-09-06 DIAGNOSIS — E669 Obesity, unspecified: Secondary | ICD-10-CM

## 2022-09-06 DIAGNOSIS — E785 Hyperlipidemia, unspecified: Secondary | ICD-10-CM | POA: Diagnosis not present

## 2022-09-06 DIAGNOSIS — Q796 Ehlers-Danlos syndrome, unspecified: Secondary | ICD-10-CM | POA: Diagnosis not present

## 2022-09-06 DIAGNOSIS — I2510 Atherosclerotic heart disease of native coronary artery without angina pectoris: Secondary | ICD-10-CM

## 2022-09-06 DIAGNOSIS — I10 Essential (primary) hypertension: Secondary | ICD-10-CM | POA: Diagnosis not present

## 2022-09-06 NOTE — Progress Notes (Signed)
Cardiology Office Note:    Date:  09/06/2022  ID:  [name] [name], DOB 1955-07-19, MRN [id number]  PCP:  Hoyt Koch, MD  Cardiologist:  Elouise Munroe, MD  Electrophysiologist:  None   Referring MD: Hoyt Koch, *   Chief Complaint/Reason for Referral: Chest pain  History of Present Illness:    [name] [name] is a 67 y.o. female with a history of Ehlers-Danlos syndrome type III, fatty liver, fibromyalgia, hypertension (resolved upon weight loss), hyperlipidemia, prediabetes and history of obesity s/p bariatric surgery in 2017. Symptoms seem most consistent with esophageal spasm after food was stuck briefly in her esophagus by subjective report on hospital presentation. Due to CAC on CTPE, Q waves on ECG and risk factors, we determined in shared decision making to perform CCTA as an outpatient, showing moderate, nonobstructive CAD. We discussed medical management of CAD.  At her last visit with me on 03/14/22 she reported that all summer she had been feeling "like a slug". She had 2 naps a day and generally felt fatigued. Mostly, she complained of an occasional "twinge" of pain inferior to her left breast, and sometimes aches in her left arm as well. She felt worn out during exertion or frequent moving/shifting while she is reclining on a sofa or bed.  She has noticed a drop in her blood pressures at home for a few weeks prior. The only recent changes she can recall is stopping her Vesicare medication. BP had returned to normal. She was having trouble sleeping but not using her CPAP consistently x5 years. Additionally she confirmed recurring esophageal spasms that are "not uncommon." Since her previous visit she had 1 notable spasm prompting her to take a nitroglycerin. However, this was mostly ineffective. She had mild bilateral LE edema and was open to trying compression socks to help.   Today:  She reports that she continues to have shortness of breath particularly  with movement of her arms/upper body. She still notices rapid heartbeats when lying down to go to bed. This lasts for a few minutes before resolving. She has not had more episodes of chest/left arm "twinge" pains since our last visit.   She continues to feel generally fatigued. However, she is getting very poor sleep at night due to her frequent movements as noted below.  She reports that she was told that during her most recent sleep study, she had frequent leg movements but no significant obstructive sleep apnea. She then saw Dr. Posey Pronto, neuro, and was told that she did not have symptoms consistent with RLS but more likely was experiencing periodic limb movement disorder.  She reports that her home BP is typically around 130s/80s. She had tried to come off of her Norvasc for a while but restarted it once her BP started to increase again.   Patient has significant health anxiety. She is prescribed prn Xanax by her PCP.  Past Medical History:  Diagnosis Date   Allergy    seasonal   Anxiety    Arthritis    Asthma    Complication of anesthesia    headache after neck surgery   Ehlers-Danlos syndrome type III    Fatty liver    Fibromyalgia    GERD (gastroesophageal reflux disease)    history of   Heart murmur    History of blood in urine    History of bronchitis    History of cholelithiasis    History of kidney stones    Hyperlipemia  Hyperparathyroidism ([place])    Hypertension    no medication needed since bariatric surgery   Insomnia    Leg pain    Low back pain    Obese    history of   Pneumonia    history of    Pre-diabetes    no since weight loss   Sleep apnea    improved since weight loss    Past Surgical History:  Procedure Laterality Date   achilles tendon tibial tendon fusion     4 surgeries   BREAST BIOPSY Right    BREAST BIOPSY Left    BREAST BIOPSY Left    CERVICAL DISCECTOMY     2010   CESAREAN SECTION     CHOLECYSTECTOMY     COLONOSCOPY      CYSTOSCOPY WITH RETROGRADE PYELOGRAM, URETEROSCOPY AND STENT PLACEMENT Right 08/01/2017   Procedure: CYSTOSCOPY WITH RETROGRADE PYELOGRAM, URETEROSCOPY AND STENT PLACEMENT;  Surgeon: Alexis Frock, MD;  Location: [facility];  Service: Urology;  Laterality: Right;   HOLMIUM LASER APPLICATION Right [id number]   Procedure: HOLMIUM LASER APPLICATION;  Surgeon: Alexis Frock, MD;  Location: [facility];  Service: Urology;  Laterality: Right;   JOINT REPLACEMENT Right    partial joint replacement knee   LAPAROSCOPIC GASTRIC RESTRICTIVE DUODENAL PROCEDURE (DUODENAL SWITCH)     2017   lower back     2008   spinal injections     SPINE SURGERY     L4-L5, C6-C7     Current Medications: Current Meds  Medication Sig   albuterol (VENTOLIN HFA) 108 (90 Base) MCG/ACT inhaler Inhale 2 puffs into the lungs every 6 (six) hours as needed for wheezing.   ALPRAZolam (XANAX) 0.5 MG tablet Take 1 tablet (0.5 mg total) by mouth 2 (two) times daily as needed for anxiety.   amLODipine (NORVASC) 10 MG tablet TAKE 1 TABLET (10 MG TOTAL) BY MOUTH DAILY.   aspirin EC 81 MG tablet Take 1 tablet (81 mg total) by mouth daily. Swallow whole.   atorvastatin (LIPITOR) 40 MG tablet Take 1 tablet (40 mg total) by mouth daily.   fexofenadine (ALLEGRA) 180 MG tablet Take 180 mg by mouth daily.   Multiple Vitamins-Minerals (BARIATRIC MULTIVITAMINS/IRON PO) Take 3 tablets by mouth daily.    potassium chloride SA (KLOR-CON) 20 MEQ tablet Take 1 tablet (20 mEq total) by mouth daily.   solifenacin (VESICARE) 10 MG tablet Take 1 tablet (10 mg total) by mouth daily.   topiramate (TOPAMAX) 50 MG tablet Take 1 tablet (50 mg total) by mouth daily.   Venlafaxine HCl 225 MG TB24 Take 1 tablet (225 mg total) by mouth daily.     Allergies:   Patient has no known allergies.   Social History   Tobacco Use   Smoking status: Never   Smokeless tobacco: Never  Vaping Use   Vaping Use: Never used   Substance Use Topics   Alcohol use: Yes    Alcohol/week: 0.0 standard drinks of alcohol    Comment: rare   Drug use: No     Family History: The patient's family history includes Healthy in her daughter; Heart disease in her father; Pancreatic cancer in her sister; Parkinson's disease in her mother. There is no history of Colon cancer, Colon polyps, Esophageal cancer, Rectal cancer, Stomach cancer, or Breast cancer.  ROS:   Please see the history of present illness.  + Fatigue + Palpitations at night + Shortness of breath + Sleep disturbance All other  systems reviewed and are negative.  EKGs/Labs/Other Studies Reviewed:    The following studies were reviewed today:  Echo 03/30/2021:  IMPRESSIONS   1. Left ventricular ejection fraction, by estimation, is 65 to 70%. Left  ventricular ejection fraction by 3D volume is 68 %. The left ventricle has  normal function. The left ventricle has no regional wall motion  abnormalities. Left ventricular diastolic   parameters were normal.   2. Right ventricular systolic function is normal. The right ventricular  size is normal. Tricuspid regurgitation signal is inadequate for assessing  PA pressure.   3. The mitral valve is grossly normal. Trivial mitral valve  regurgitation. No evidence of mitral stenosis.   4. The aortic valve was not well visualized. Aortic valve regurgitation  is not visualized. No aortic stenosis is present.  Comparison(s): A prior study was performed on 03/09/2007. No significant  change from prior study.   Coronary CTA 12/08/2019: IMPRESSION: 1. Moderate CAD in proximal LAD, CADRADS = 3. CT FFR will be performed and reported separately. 2. Coronary calcium score is 62, which places the patient in the 78th percentile for age and sex matched control. 3. Normal coronary origin with right dominance.  CT FFR Analysis 12/08/2019:  FINDINGS: FFRct analysis was performed on the original cardiac CT angiogram dataset.  Diagrammatic representation of the FFRct analysis is provided in a separate PDF document in PACS. This dictation was created using the PDF document and an interactive 3D model of the results. 3D model is not available in the EMR/PACS. Normal FFR range is >0.80. Indeterminate (grey) zone is 0.76-0.80. 1. Left Main: FFR = 0.98 2. LAD: Proximal FFR = 0.93, Mid FFR = 0.90, Distal FFR = 0.83 3. LCX: Proximal FFR = 0.97, Distal FFR = 0.94 OM FFR = 0.87 4. RCA: Proximal FFR = 0.95, Mid FFR =0.90, Distal FFR = 0.89 IMPRESSION: 1.  CT FFR analysis showed no significant stenosis. RECOMMENDATIONS: Goal directed medical therapy and aggressive risk factor modification for secondary prevention of coronary artery disease   EKG:  EKG is personally reviewed. 09/06/22: NSR with inferior infarct pattern 03/14/2022: Sinus rhythm. Inferior and anterior infarct pattern.  09/13/2021: NSR, inf and ant infarct pattern   Recent Labs: No results found for requested labs within last 365 days.  Recent Lipid Panel    Component Value Date/Time   CHOL 142 11/20/2020 1041   TRIG 74.0 11/20/2020 1041   HDL 65.30 11/20/2020 1041   CHOLHDL 2 11/20/2020 1041   VLDL 14.8 11/20/2020 1041   LDLCALC 62 11/20/2020 1041    Physical Exam:    VS:  BP 130/76   Pulse 90   Ht 5\' 1"  (1.549 m)   Wt 196 lb (88.9 kg)   SpO2 96%   BMI 37.03 kg/m     Wt Readings from Last 5 Encounters:  09/06/22 196 lb (88.9 kg)  08/20/22 196 lb (88.9 kg)  05/06/22 185 lb (83.9 kg)  03/14/22 193 lb 3.2 oz (87.6 kg)  03/06/22 192 lb (87.1 kg)    Constitutional: No acute distress Eyes: sclera non-icteric, normal conjunctiva and lids ENMT: normal dentition, moist mucous membranes Cardiovascular: regular rhythm, normal rate, no murmurs. S1 and S2 normal. No jugular venous distention.  Respiratory: clear to auscultation bilaterally GI : normal bowel sounds, soft and nontender. No distention.   MSK: extremities warm, well perfused. No  edema.  NEURO: grossly nonfocal exam, moves all extremities. PSYCH: alert and oriented x 3, normal mood and affect.   ASSESSMENT:  1. Hyperlipidemia, unspecified hyperlipidemia type   2. Obesity (BMI 35.0-39.9 without comorbidity)   3. Coronary artery disease involving native coronary artery of native heart without angina pectoris   4. Primary hypertension   5. Ehlers-Danlos syndrome     PLAN:    Coronary artery disease involving native coronary artery of native heart without angina pectoris  - Plan: EKG 12-Lead - continue ASA 81 mg daily and statin - sublingual nitro for chest pain, likely esophageal spasm. - discussed medical management of CAD today. Fortunately risk factors are mostly optimized.  Essential hypertension - Plan: EKG 12-Lead -BP stable, continue amlodipine 10 mg daily.  HLD  - continue atorvastatin 40 mg daily.  - Last labs 10/2021: total cholesterol 163, triglycerides 126, HDL 65, and LDL 73. - Pt agreeable to meeting with pharmacist to discuss PCSK9 inhibitor options- she would also like to discuss possibly starting Wegovy as she is currently on Topamax with plateau in her weight loss. She is hopeful for aggressive lipid lowering and plaque reduction capabilities of PCSK9I therapy.   Ehlers-Danlos syndrome Palpitations - No vascular ectasias noted in visualized portions of CCTA. No mitral valve prolapse, mild MR. - She has palpitations only after lying down in bed at night- usually brief- likely associated with this positional change. - Patient deferred testing to further investigate this at this time.  DOE Obesity  OSA Fatigue - Prior diagnosis of OSA. - Repeat sleep study 04/2022 showed frequent periodic limb movements but no significant OSA. - Likely fatigued due to poor sleep associated with her period limb movement disorder- she has established neuro care with Dr. Narda Amber.   Follow up: 6 months  Total time of encounter: 30 minutes total  time of encounter, including 20 minutes spent in face-to-face patient care on the date of this encounter. This time includes coordination of care and counseling regarding above mentioned problem list. Remainder of non-face-to-face time involved reviewing chart documents/testing relevant to the patient encounter and documentation in the medical record. I have independently reviewed documentation from referring provider.   Cherlynn Kaiser, MD, [facility]    Medication Adjustments/Labs and Tests Ordered: Current medicines are reviewed at length with the patient today.  Concerns regarding medicines are outlined above.   Orders Placed This Encounter  Procedures   AMB Referral to [facility] Pharm-D   EKG 12-Lead   No orders of the defined types were placed in this encounter.  Patient Instructions  Medication Instructions:  No Changes In Medications at this time.  *If you need a refill on your cardiac medications before your next appointment, please call your pharmacy*  Follow-Up: At [facility], you and your health needs are our priority.  As part of our continuing mission to provide you with exceptional heart care, we have created designated Provider Care Teams.  These Care Teams include your primary Cardiologist (physician) and Advanced Practice Providers (APPs -  Physician Assistants and Nurse Practitioners) who all work together to provide you with the care you need, when you need it.  PLEASE SCHEDULE APPOINTMENT WITH [facility] FOR CHOLESTEROL/WEIGHT LOSS   Your next appointment:   6 month(s)  Provider:   Elouise Munroe, MD         I,Alexis Herring,acting as a scribe for Elouise Munroe, MD.,have documented all relevant documentation on the behalf of Elouise Munroe, MD,as directed by  Elouise Munroe, MD while in the presence of Elouise Munroe, MD.  I, Marcina Millard  Margaretann Loveless, MD, have reviewed all documentation for the visit on 09/06/2022. The  documentation on today's date of service for the exam, diagnosis, procedures, and orders are all accurate and complete.

## 2022-09-06 NOTE — Patient Instructions (Signed)
Medication Instructions:  No Changes In Medications at this time.  *If you need a refill on your cardiac medications before your next appointment, please call your pharmacy*  Follow-Up: At [facility], you and your health needs are our priority.  As part of our continuing mission to provide you with exceptional heart care, we have created designated Provider Care Teams.  These Care Teams include your primary Cardiologist (physician) and Advanced Practice Providers (APPs -  Physician Assistants and Nurse Practitioners) who all work together to provide you with the care you need, when you need it.  PLEASE SCHEDULE APPOINTMENT WITH [facility] FOR CHOLESTEROL/WEIGHT LOSS   Your next appointment:   6 month(s)  Provider:   Elouise Munroe, MD

## 2022-09-23 ENCOUNTER — Other Ambulatory Visit: Payer: Self-pay | Admitting: Internal Medicine

## 2022-09-30 DIAGNOSIS — M170 Bilateral primary osteoarthritis of knee: Secondary | ICD-10-CM | POA: Diagnosis not present

## 2022-10-07 ENCOUNTER — Other Ambulatory Visit: Payer: Self-pay | Admitting: Physician Assistant

## 2022-10-07 ENCOUNTER — Ambulatory Visit
Admission: RE | Admit: 2022-10-07 | Discharge: 2022-10-07 | Disposition: A | Discharge status: Home / Self Care | Payer: Medicare PPO | Source: Ambulatory Visit | Attending: Physician Assistant | Admitting: Physician Assistant

## 2022-10-07 DIAGNOSIS — M79662 Pain in left lower leg: Secondary | ICD-10-CM

## 2022-10-07 DIAGNOSIS — M79605 Pain in left leg: Secondary | ICD-10-CM | POA: Diagnosis not present

## 2022-10-07 DIAGNOSIS — M25562 Pain in left knee: Secondary | ICD-10-CM | POA: Diagnosis not present

## 2022-10-14 NOTE — Progress Notes (Unsigned)
Office Visit    Patient Name: [name] [name] Date of Encounter: 10/16/2022  Primary Care Provider:  Myrlene Broker, MD Primary Cardiologist:  Parke Poisson, MD  Chief Complaint    Weight management; hyperlipidemia  Significant Past Medical History   CAD 50-69% stenosis of pLAD, 25-49% in m-dRCA; CAC 78th percentile  hypertension Controlled without medication at this time  preDM A1c 1 year ago 5.5, down significantly since bariatric surgery (6.1)  Ehlers-Danlos Type III -   fibromyalgia     No Known Allergies  History of Present Illness    [name] [name] is a 67 y.o. female patient of Dr Jacques Navy, in the office today to discuss cholesterol management as well as look at weight loss options.  Her most recent cholesterol labs were drawn a year ago and showed her LDL cholesterol to be well controlled at 73 on atorvastatin 40 mg.  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   She did well with her weight, maintaining between 64-69 kg for some time.  Then in 2022 she broke her ankle and ended up sedentary for 3 months.  Her weight started going up then and she has gained over 20 kg since then.    Current weight management medications: none Current cholesterol medications:  atorvastatin 40 mg   Current meds that may affect weight: none  Baseline weight/BMI:  196 lb // 37.05  Insurance payor:  [employer]  Diet: eats out regularly since husband retired; occasional vegetables or salads; some bananas; snack is protein bars; beef and chicken for protein  Exercise: no regular exercise  Family History: father (CABG x 4 at 36, died at 32 ASCVD); mother (early onset Parkinsons'), mgm died 41 storke; siblings healthy (sister beat pancreatic cancer); daughter 26 healthy  (pgf died 72 MI, as well as several uncles)  Confirmed patient not pregnant and no personal or family history of medullary thyroid carcinoma (MTC) or  Multiple Endocrine Neoplasia syndrome type 2 (MEN 2).   Social History:   Tobacco: no  Alcohol: rare alcohol  Caffeine: soda and tea daily   Accessory Clinical Findings    Lab Results  Component Value Date   CREATININE 0.78 11/20/2020   BUN 14 11/20/2020   NA 145 11/20/2020   K 3.6 11/20/2020   CL 108 11/20/2020   CO2 30 11/20/2020   Lab Results  Component Value Date   ALT 28 11/20/2020   AST 29 11/20/2020   ALKPHOS 129 (H) 11/20/2020   BILITOT 0.4 11/20/2020   Lab Results  Component Value Date   HGBA1C 5.5 11/20/2020      Home Medications/Allergies    Current Outpatient Medications  Medication Sig Dispense Refill   albuterol (VENTOLIN HFA) 108 (90 Base) MCG/ACT inhaler Inhale 2 puffs into the lungs every 6 (six) hours as needed for wheezing. 6.7 g 3   ALPRAZolam (XANAX) 0.5 MG tablet TAKE 1 TABLET BY MOUTH 2 TIMES DAILY AS NEEDED FOR ANXIETY. 60 tablet 5   amLODipine (NORVASC) 10 MG tablet TAKE 1 TABLET (10 MG TOTAL) BY MOUTH DAILY. 90 tablet 3   aspirin EC 81 MG tablet Take 1 tablet (81 mg total) by mouth daily. Swallow whole. 90 tablet 3   atorvastatin (LIPITOR) 40 MG tablet Take 1 tablet (40 mg total) by mouth daily. 90 tablet 3   fexofenadine (ALLEGRA) 180 MG tablet Take 180 mg by mouth daily.     Multiple  Vitamins-Minerals (BARIATRIC MULTIVITAMINS/IRON PO) Take 3 tablets by mouth daily.      nitroGLYCERIN (NITROSTAT) 0.4 MG SL tablet Place 1 tablet (0.4 mg total) under the tongue every 5 (five) minutes as needed for chest pain. 25 tablet 2   potassium chloride SA (KLOR-CON) 20 MEQ tablet Take 1 tablet (20 mEq total) by mouth daily. 30 tablet 1   solifenacin (VESICARE) 10 MG tablet Take 1 tablet (10 mg total) by mouth daily. 90 tablet 3   topiramate (TOPAMAX) 50 MG tablet Take 1 tablet (50 mg total) by mouth daily. 30 tablet 3   Venlafaxine HCl 225 MG TB24 Take 1 tablet (225 mg total) by mouth daily. 90 tablet 3   No current facility-administered medications for  this visit.     No Known Allergies  Assessment & Plan    Hyperlipidemia Assessment: Patient with ASCVD not at LDL goal of < 70 Most recent LDL 73 in May 2023 Has been compliant with high intensity statin:  atorvastatin 40 mg  Reviewed options for lowering LDL cholesterol, including ezetimibe, PCSK-9 inhibitors, bempedoic acid and inclisiran.  Discussed mechanisms of action, dosing, side effects, potential decreases in LDL cholesterol and costs.  Also reviewed potential options for patient assistance. Will need updated labs before deciding on best treatment option - unsure if ezetimibe would have significant effect because of her duodenal switch bariatric surgery.    Plan: Update labs in the next week. If LDL at goal/close to goal, consider increasing atorvastatin to 80 mg daily IF LDL significantly higher, consider Repatha in addition to atorvastatin 40 mg Repeat labs after:  3 months Lipid Liver function Patient was given information on [employer] - will sign her up should Repatha be prescribed.    Morbid obesity (HCC) Patient has not met goal of at least 5% of body weight loss with comprehensive lifestyle modifications alone in the past 3-6 months. Pharmacotherapy is appropriate to pursue as augmentation. Will start Zepbound.   Confirmed patient not pregnant and no personal or family history of medullary thyroid carcinoma (MTC) or Multiple Endocrine Neoplasia syndrome type 2 (MEN 2).   Advised patient on common side effects including nausea, diarrhea, dyspepsia, decreased appetite, and fatigue. Counseled patient on reducing meal size and how to titrate medication to minimize side effects. Patient aware to call if intolerable side effects or if experiencing dehydration, abdominal pain, or dizziness. Patient will adhere to dietary modifications and will target at least 150 minutes of moderate intensity exercise weekly.   Injection technique reviewed at today's visit.     Titration Plan:  Will plan to follow the titration plan as below, pending patient is tolerating each dose before increasing to the next. Can slow titration if needed for tolerability.    -Month 1: Inject 2.5 mg SQ once weekly x 4 weeks -Month 2: Inject 5 mg SQ once weekly x 4 weeks -Month 3: Inject 7.5 mg SQ once weekly x 4 weeks -Month 4+: Inject 10 mg SQ once weekly  x 4 weeks Increase as tolerated  Follow up in 3 months.   Phillips Hay PharmD CPP [facility]  [address] [place], [place] [address] [phone]

## 2022-10-15 ENCOUNTER — Other Ambulatory Visit: Payer: Self-pay | Admitting: Pharmacist Clinician (PhC)/ Clinical Pharmacy Specialist

## 2022-10-15 ENCOUNTER — Ambulatory Visit: Payer: Medicare PPO

## 2022-10-15 ENCOUNTER — Ambulatory Visit
Payer: Medicare PPO | Attending: Cardiovascular Disease | Admitting: Pharmacist Clinician (PhC)/ Clinical Pharmacy Specialist

## 2022-10-15 VITALS — Ht 61.0 in | Wt 196.0 lb

## 2022-10-15 DIAGNOSIS — E785 Hyperlipidemia, unspecified: Secondary | ICD-10-CM | POA: Diagnosis not present

## 2022-10-15 NOTE — Patient Instructions (Signed)
Your Results:             Your most recent labs Goal  Total Cholesterol need < 200  Triglycerides updated < 150  HDL (happy/good cholesterol) labs > 40  LDL (lousy/bad cholesterol  < 70   Medication changes:  We will start the process to get Zepbound (or [place]) covered by your insurance.    Lab orders:  We need to get updated cholesterol labs to start the process.  Once we determine whether to increase the atorvastatin or add Repatha, we can move forward   We want to repeat labs after 2-3 months.  We will send you a lab order to remind you once we get closer to that time.    Patient Assistance:  The [employer] foundation offers assistance to help pay for medication copays.  They will cover copays for all cholesterol lowering meds, including statins, fibrates, omega-3 oils, ezetimibe, Repatha, Praluent, Nexletol, Nexlizet.  The cards are usually good for $2,500 or 12 months, whichever comes first. Go to healthwellfoundation.org Click on "Apply Now" Answer questions as to whom is applying (patient or representative) Your disease fund will be "hypercholesterolemia - Medicare access" Select the cholesterol medication you need assistance with (Repatha, Praluent, Nexlizet...) They will ask question about qualifying diagnosis - you can mark "yes"; and do you have insurance coverage.   When they ask what type of assistance you are interested in - "copay assistance" When you submit, the approval is usually within minutes.  You will need to print the card information from the site You will need to show this information to your pharmacy, they will bill your Medicare Part D plan first -then bill [employer] --for the copay.   You can also call them at [phone], although the hold times can be quite long.   Thank you for choosing [facility]

## 2022-10-16 ENCOUNTER — Telehealth: Payer: Self-pay | Admitting: Pharmacist Clinician (PhC)/ Clinical Pharmacy Specialist

## 2022-10-16 ENCOUNTER — Encounter: Payer: Self-pay | Admitting: Pharmacist Clinician (PhC)/ Clinical Pharmacy Specialist

## 2022-10-16 MED ORDER — SEMAGLUTIDE-WEIGHT MANAGEMENT 0.25 MG/0.5ML ~~LOC~~ SOAJ: 0.2500 mg | SUBCUTANEOUS | 1 refills | Status: DC

## 2022-10-16 MED ORDER — SEMAGLUTIDE-WEIGHT MANAGEMENT 1.7 MG/0.75ML ~~LOC~~ SOAJ: 1.7000 mg | SUBCUTANEOUS | 1 refills | Status: DC

## 2022-10-16 MED ORDER — SEMAGLUTIDE-WEIGHT MANAGEMENT 0.5 MG/0.5ML ~~LOC~~ SOAJ: 0.5000 mg | SUBCUTANEOUS | 1 refills | Status: DC

## 2022-10-16 MED ORDER — SEMAGLUTIDE-WEIGHT MANAGEMENT 2.4 MG/0.75ML ~~LOC~~ SOAJ: 2.4000 mg | SUBCUTANEOUS | 1 refills | Status: DC

## 2022-10-16 MED ORDER — SEMAGLUTIDE-WEIGHT MANAGEMENT 1 MG/0.5ML ~~LOC~~ SOAJ: 1.0000 mg | SUBCUTANEOUS | 1 refills | Status: DC

## 2022-10-16 NOTE — Assessment & Plan Note (Addendum)
Patient has not met goal of at least 5% of body weight loss with comprehensive lifestyle modifications alone in the past 3-6 months. Pharmacotherapy is appropriate to pursue as augmentation. Will start [facility] for reduction in CV risk for patients with ASCVD   Confirmed patient not pregnant and no personal or family history of medullary thyroid carcinoma (MTC) or Multiple Endocrine Neoplasia syndrome type 2 (MEN 2).   Advised patient on common side effects including nausea, diarrhea, dyspepsia, decreased appetite, and fatigue. Counseled patient on reducing meal size and how to titrate medication to minimize side effects. Patient aware to call if intolerable side effects or if experiencing dehydration, abdominal pain, or dizziness. Patient will adhere to dietary modifications and will target at least 150 minutes of moderate intensity exercise weekly.   Injection technique reviewed at today's visit.    Titration Plan:  Will plan to follow the titration plan as below, pending patient is tolerating each dose before increasing to the next. Can slow titration if needed for tolerability.    -Month 1: Inject 0.25 mg SQ once weekly x 4 weeks -Month 2: Inject 0.5 mg SQ once weekly x 4 weeks -Month 3: Inject 1 mg SQ once weekly x 4 weeks -Month 4+: Inject 1.7 mg SQ once weekly  x 4 weeks Increase as tolerated to maximum of 2.4 mg weekly  Follow up in 3 months.

## 2022-10-16 NOTE — Assessment & Plan Note (Addendum)
Assessment: Patient with ASCVD not at LDL goal of < 70 Most recent LDL 73 in May 2023 Has been compliant with high intensity statin:  atorvastatin 40 mg  Reviewed options for lowering LDL cholesterol, including ezetimibe, PCSK-9 inhibitors, bempedoic acid and inclisiran.  Discussed mechanisms of action, dosing, side effects, potential decreases in LDL cholesterol and costs.  Also reviewed potential options for patient assistance. Will need updated labs before deciding on best treatment option - unsure if ezetimibe would have significant effect because of her duodenal switch bariatric surgery.    Plan: Update labs in the next week. If LDL at goal/close to goal, consider increasing atorvastatin to 80 mg daily IF LDL significantly higher, consider Repatha in addition to atorvastatin 40 mg Repeat labs after:  3 months Lipid Liver function Patient was given information on [employer] - will sign her up should Repatha be prescribed.

## 2022-10-16 NOTE — Telephone Encounter (Signed)
PA for [facility] approved to 06/17/23.    Patient aware, Rx sent to pharmacy

## 2022-10-19 ENCOUNTER — Observation Stay (HOSPITAL_BASED_OUTPATIENT_CLINIC_OR_DEPARTMENT_OTHER)
Admission: EM | Admit: 2022-10-19 | Discharge: 2022-10-21 | Disposition: A | Discharge status: Home / Self Care | Payer: Medicare PPO | Attending: General Surgery | Admitting: General Surgery

## 2022-10-19 ENCOUNTER — Emergency Department (HOSPITAL_BASED_OUTPATIENT_CLINIC_OR_DEPARTMENT_OTHER): Payer: Medicare PPO

## 2022-10-19 ENCOUNTER — Other Ambulatory Visit: Payer: Self-pay

## 2022-10-19 ENCOUNTER — Encounter (HOSPITAL_BASED_OUTPATIENT_CLINIC_OR_DEPARTMENT_OTHER): Payer: Self-pay | Admitting: Emergency Medicine

## 2022-10-19 DIAGNOSIS — Z96651 Presence of right artificial knee joint: Secondary | ICD-10-CM | POA: Insufficient documentation

## 2022-10-19 DIAGNOSIS — K35891 Other acute appendicitis without perforation, with gangrene: Principal | ICD-10-CM | POA: Insufficient documentation

## 2022-10-19 DIAGNOSIS — R109 Unspecified abdominal pain: Secondary | ICD-10-CM | POA: Diagnosis not present

## 2022-10-19 DIAGNOSIS — J45909 Unspecified asthma, uncomplicated: Secondary | ICD-10-CM | POA: Diagnosis not present

## 2022-10-19 DIAGNOSIS — R1031 Right lower quadrant pain: Secondary | ICD-10-CM | POA: Diagnosis present

## 2022-10-19 DIAGNOSIS — I10 Essential (primary) hypertension: Secondary | ICD-10-CM | POA: Insufficient documentation

## 2022-10-19 DIAGNOSIS — K3580 Unspecified acute appendicitis: Secondary | ICD-10-CM | POA: Diagnosis present

## 2022-10-19 DIAGNOSIS — I700 Atherosclerosis of aorta: Secondary | ICD-10-CM | POA: Diagnosis not present

## 2022-10-19 DIAGNOSIS — K353 Acute appendicitis with localized peritonitis, without perforation or gangrene: Secondary | ICD-10-CM | POA: Diagnosis not present

## 2022-10-19 HISTORY — DX: Nausea with vomiting, unspecified: R11.2

## 2022-10-19 HISTORY — DX: Nausea with vomiting, unspecified: Z98.890

## 2022-10-19 LAB — COMPREHENSIVE METABOLIC PANEL
ALT: 51 U/L — ABNORMAL HIGH (ref 0–44)
AST: 38 U/L (ref 15–41)
Albumin: 4 g/dL (ref 3.5–5.0)
Alkaline Phosphatase: 143 U/L — ABNORMAL HIGH (ref 38–126)
Anion gap: 11 (ref 5–15)
BUN: 14 mg/dL (ref 8–23)
CO2: 23 mmol/L (ref 22–32)
Calcium: 9 mg/dL (ref 8.9–10.3)
Chloride: 104 mmol/L (ref 98–111)
Creatinine, Ser: 0.69 mg/dL (ref 0.44–1.00)
GFR, Estimated: >60 mL/min (ref >60)
Glucose, Bld: 107 mg/dL — ABNORMAL HIGH (ref 70–99)
Potassium: 3.3 mmol/L — ABNORMAL LOW (ref 3.5–5.1)
Sodium: 138 mmol/L (ref 135–145)
Total Bilirubin: 0.6 mg/dL (ref 0.3–1.2)
Total Protein: 7.1 g/dL (ref 6.5–8.1)

## 2022-10-19 LAB — URINALYSIS, ROUTINE W REFLEX MICROSCOPIC
Bilirubin Urine: NEGATIVE
Glucose, UA: NEGATIVE mg/dL
Hgb urine dipstick: NEGATIVE
Ketones, ur: NEGATIVE mg/dL
Leukocytes,Ua: NEGATIVE
Nitrite: NEGATIVE
Protein, ur: NEGATIVE mg/dL
Specific Gravity, Urine: 1.021 (ref 1.005–1.030)
pH: 5.5 (ref 5.0–8.0)

## 2022-10-19 LAB — CBC
HCT: 42.2 % (ref 36.0–46.0)
Hemoglobin: 13.9 g/dL (ref 12.0–15.0)
MCH: 29.6 pg (ref 26.0–34.0)
MCHC: 32.9 g/dL (ref 30.0–36.0)
MCV: 89.8 fL (ref 80.0–100.0)
Platelets: 401 10*3/uL — ABNORMAL HIGH (ref 150–400)
RBC: 4.7 MIL/uL (ref 3.87–5.11)
RDW: 12.9 % (ref 11.5–15.5)
WBC: 17.3 10*3/uL — ABNORMAL HIGH (ref 4.0–10.5)
nRBC: 0 % (ref 0.0–0.2)

## 2022-10-19 LAB — BRAIN NATRIURETIC PEPTIDE: B Natriuretic Peptide: 56 pg/mL (ref 0.0–100.0)

## 2022-10-19 LAB — LIPASE, BLOOD: Lipase: 19 U/L (ref 11–51)

## 2022-10-19 MED ORDER — LACTATED RINGERS IV BOLUS
1000.0000 mL | Freq: Once | INTRAVENOUS | Status: AC
  Administered 2022-10-19: 1000 mL via INTRAVENOUS

## 2022-10-19 MED ORDER — SUCCINYLCHOLINE CHLORIDE 200 MG/10ML IV SOSY
PREFILLED_SYRINGE | INTRAVENOUS | Status: AC
  Filled 2022-10-19: qty 10

## 2022-10-19 MED ORDER — LIDOCAINE 2% (20 MG/ML) 5 ML SYRINGE
INTRAMUSCULAR | Status: AC
  Filled 2022-10-19: qty 5

## 2022-10-19 MED ORDER — VENLAFAXINE HCL ER 75 MG PO CP24
225.0000 mg | ORAL_CAPSULE | Freq: Every day | ORAL | Status: DC
  Administered 2022-10-20 – 2022-10-21 (×2): 225 mg via ORAL
  Filled 2022-10-19 (×2): qty 3

## 2022-10-19 MED ORDER — HYDRALAZINE HCL 20 MG/ML IJ SOLN: 10.0000 mg | INTRAMUSCULAR | Status: DC | PRN

## 2022-10-19 MED ORDER — ONDANSETRON 4 MG PO TBDP
4.0000 mg | ORAL_TABLET | Freq: Four times a day (QID) | ORAL | Status: DC | PRN
  Administered 2022-10-21: 4 mg via ORAL
  Filled 2022-10-19: qty 1

## 2022-10-19 MED ORDER — ACETAMINOPHEN 500 MG PO TABS
1000.0000 mg | ORAL_TABLET | Freq: Four times a day (QID) | ORAL | Status: DC
  Administered 2022-10-19 – 2022-10-21 (×6): 1000 mg via ORAL
  Filled 2022-10-19 (×6): qty 2

## 2022-10-19 MED ORDER — DIPHENHYDRAMINE HCL 12.5 MG/5ML PO ELIX: 12.5000 mg | ORAL_SOLUTION | Freq: Four times a day (QID) | ORAL | Status: DC | PRN

## 2022-10-19 MED ORDER — LORATADINE 10 MG PO TABS
10.0000 mg | ORAL_TABLET | Freq: Every day | ORAL | Status: DC
  Administered 2022-10-20 – 2022-10-21 (×2): 10 mg via ORAL
  Filled 2022-10-19 (×2): qty 1

## 2022-10-19 MED ORDER — PROPOFOL 10 MG/ML IV BOLUS
INTRAVENOUS | Status: AC
  Filled 2022-10-19: qty 20

## 2022-10-19 MED ORDER — ALPRAZOLAM 0.5 MG PO TABS
0.5000 mg | ORAL_TABLET | Freq: Two times a day (BID) | ORAL | Status: DC | PRN
  Administered 2022-10-20 (×2): 0.5 mg via ORAL
  Filled 2022-10-19: qty 1

## 2022-10-19 MED ORDER — ATORVASTATIN CALCIUM 40 MG PO TABS
40.0000 mg | ORAL_TABLET | Freq: Every day | ORAL | Status: DC
  Administered 2022-10-20 – 2022-10-21 (×2): 40 mg via ORAL
  Filled 2022-10-19 (×2): qty 1

## 2022-10-19 MED ORDER — IBUPROFEN 200 MG PO TABS: 600.0000 mg | ORAL_TABLET | Freq: Four times a day (QID) | ORAL | Status: DC | PRN

## 2022-10-19 MED ORDER — HYDROMORPHONE HCL 1 MG/ML IJ SOLN: 0.5000 mg | INTRAMUSCULAR | Status: DC | PRN

## 2022-10-19 MED ORDER — ASPIRIN 81 MG PO TBEC
81.0000 mg | DELAYED_RELEASE_TABLET | Freq: Every day | ORAL | Status: DC
  Administered 2022-10-20 – 2022-10-21 (×2): 81 mg via ORAL
  Filled 2022-10-19 (×2): qty 1

## 2022-10-19 MED ORDER — PHENYLEPHRINE 80 MCG/ML (10ML) SYRINGE FOR IV PUSH (FOR BLOOD PRESSURE SUPPORT)
PREFILLED_SYRINGE | INTRAVENOUS | Status: AC
  Filled 2022-10-19: qty 10

## 2022-10-19 MED ORDER — MORPHINE SULFATE (PF) 4 MG/ML IV SOLN
4.0000 mg | INTRAVENOUS | Status: DC | PRN
  Administered 2022-10-19 (×2): 4 mg via INTRAVENOUS
  Filled 2022-10-19 (×2): qty 1

## 2022-10-19 MED ORDER — IOHEXOL 300 MG/ML  SOLN
100.0000 mL | Freq: Once | INTRAMUSCULAR | Status: AC | PRN
  Administered 2022-10-19: 80 mL via INTRAVENOUS

## 2022-10-19 MED ORDER — DIPHENHYDRAMINE HCL 50 MG/ML IJ SOLN: 12.5000 mg | Freq: Four times a day (QID) | INTRAMUSCULAR | Status: DC | PRN

## 2022-10-19 MED ORDER — ONDANSETRON HCL 4 MG/2ML IJ SOLN
INTRAMUSCULAR | Status: AC
  Filled 2022-10-19: qty 2

## 2022-10-19 MED ORDER — ALBUTEROL SULFATE (2.5 MG/3ML) 0.083% IN NEBU: 2.5000 mg | INHALATION_SOLUTION | Freq: Four times a day (QID) | RESPIRATORY_TRACT | Status: DC | PRN

## 2022-10-19 MED ORDER — PIPERACILLIN-TAZOBACTAM 3.375 G IVPB
3.3750 g | Freq: Once | INTRAVENOUS | Status: AC
  Administered 2022-10-19: 3.375 g via INTRAVENOUS
  Filled 2022-10-19: qty 50

## 2022-10-19 MED ORDER — HEPARIN SODIUM (PORCINE) 5000 UNIT/ML IJ SOLN
5000.0000 [IU] | Freq: Three times a day (TID) | INTRAMUSCULAR | Status: DC
  Administered 2022-10-19 – 2022-10-21 (×4): 5000 [IU] via SUBCUTANEOUS
  Filled 2022-10-19 (×4): qty 1

## 2022-10-19 MED ORDER — MELATONIN 3 MG PO TABS: 3.0000 mg | ORAL_TABLET | Freq: Every evening | ORAL | Status: DC | PRN

## 2022-10-19 MED ORDER — TOPIRAMATE 25 MG PO TABS: 50.0000 mg | ORAL_TABLET | Freq: Every day | ORAL | Status: DC

## 2022-10-19 MED ORDER — TRAMADOL HCL 50 MG PO TABS: 50.0000 mg | ORAL_TABLET | Freq: Four times a day (QID) | ORAL | Status: DC | PRN

## 2022-10-19 MED ORDER — DEXAMETHASONE SODIUM PHOSPHATE 10 MG/ML IJ SOLN
INTRAMUSCULAR | Status: AC
  Filled 2022-10-19: qty 1

## 2022-10-19 MED ORDER — NITROGLYCERIN 0.4 MG SL SUBL: 0.4000 mg | SUBLINGUAL_TABLET | SUBLINGUAL | Status: DC | PRN

## 2022-10-19 MED ORDER — PIPERACILLIN-TAZOBACTAM 3.375 G IVPB
3.3750 g | Freq: Three times a day (TID) | INTRAVENOUS | Status: DC
  Administered 2022-10-20 (×2): 3.375 g via INTRAVENOUS
  Filled 2022-10-19 (×2): qty 50

## 2022-10-19 MED ORDER — ONDANSETRON HCL 4 MG/2ML IJ SOLN
4.0000 mg | Freq: Once | INTRAMUSCULAR | Status: AC
  Administered 2022-10-19: 4 mg via INTRAVENOUS
  Filled 2022-10-19: qty 2

## 2022-10-19 MED ORDER — AMLODIPINE BESYLATE 10 MG PO TABS
10.0000 mg | ORAL_TABLET | Freq: Every day | ORAL | Status: DC
  Administered 2022-10-20 – 2022-10-21 (×2): 10 mg via ORAL
  Filled 2022-10-19 (×2): qty 1

## 2022-10-19 MED ORDER — ONDANSETRON HCL 4 MG/2ML IJ SOLN: 4.0000 mg | Freq: Four times a day (QID) | INTRAMUSCULAR | Status: DC | PRN

## 2022-10-19 MED ORDER — LACTATED RINGERS IV SOLN: INTRAVENOUS | Status: DC

## 2022-10-19 MED ORDER — ROCURONIUM BROMIDE 10 MG/ML (PF) SYRINGE
PREFILLED_SYRINGE | INTRAVENOUS | Status: AC
  Filled 2022-10-19: qty 10

## 2022-10-19 MED ORDER — SIMETHICONE 80 MG PO CHEW: 40.0000 mg | CHEWABLE_TABLET | Freq: Four times a day (QID) | ORAL | Status: DC | PRN

## 2022-10-19 MED ORDER — ALBUTEROL SULFATE HFA 108 (90 BASE) MCG/ACT IN AERS: 2.0000 | INHALATION_SPRAY | Freq: Four times a day (QID) | RESPIRATORY_TRACT | Status: DC | PRN

## 2022-10-19 MED ORDER — BUPIVACAINE-EPINEPHRINE (PF) 0.5% -1:200000 IJ SOLN
INTRAMUSCULAR | Status: AC
  Filled 2022-10-19: qty 30

## 2022-10-19 NOTE — ED Notes (Signed)
ED TO INPATIENT HANDOFF REPORT  ED Nurse Name and Phone #: [phone]  S Name/Age/Gender [name] [name] 67 y.o. female Room/Bed: H011C/H011C  Code Status   Code Status: Full Code  Home/SNF/Other Home Patient oriented to: self, place, time, and situation Is this baseline? Yes   Triage Complete: Triage complete  Chief Complaint Acute appendicitis [K35.80]  Triage Note Pt presents to ED Pov. Pt c/o RQL bloating, nausea, and abd pain since 1000 this morning. pain radiates towards epigastric. Pt describes pain as 9/10, aching and sharp and constant. Pt taken gasX and pepto w/o relief.   Pt also adds that she had noted LE edema.   Allergies No Known Allergies  Level of Care/Admitting Diagnosis ED Disposition     ED Disposition  Admit   Condition  --   Comment  Hospital Area: [facility] [[id number]]  Level of Care: Med-Surg [16]  May place patient in observation at [facility] or [facility] if equivalent level of care is available:: No  Covid Evaluation: Asymptomatic - no recent exposure (last 10 days) testing not required  Diagnosis: Acute appendicitis [744919]  Admitting Physician: Andria Meuse [[id number]]  Attending Physician: Andria Meuse [[id number]]  Bed request comments: 6n          B Medical/Surgery History Past Medical History:  Diagnosis Date   Allergy    seasonal   Anxiety    Arthritis    Asthma    Complication of anesthesia    headache after neck surgery   Ehlers-Danlos syndrome type III    Fatty liver    Fibromyalgia    GERD (gastroesophageal reflux disease)    history of   Heart murmur    History of blood in urine    History of bronchitis    History of cholelithiasis    History of kidney stones    Hyperlipemia    Hyperparathyroidism (HCC)    Hypertension    no medication needed since bariatric surgery   Insomnia    Leg pain    Low back pain    Obese    history of   Pneumonia    history of    Pre-diabetes     no since weight loss   Sleep apnea    improved since weight loss   Past Surgical History:  Procedure Laterality Date   achilles tendon tibial tendon fusion     4 surgeries   BREAST BIOPSY Right    BREAST BIOPSY Left    BREAST BIOPSY Left    CERVICAL DISCECTOMY     2010   CESAREAN SECTION     CHOLECYSTECTOMY     COLONOSCOPY     CYSTOSCOPY WITH RETROGRADE PYELOGRAM, URETEROSCOPY AND STENT PLACEMENT Right 08/01/2017   Procedure: CYSTOSCOPY WITH RETROGRADE PYELOGRAM, URETEROSCOPY AND STENT PLACEMENT;  Surgeon: Sebastian Ache, MD;  Location: [facility];  Service: Urology;  Laterality: Right;   HOLMIUM LASER APPLICATION Right 08/01/2017   Procedure: HOLMIUM LASER APPLICATION;  Surgeon: Sebastian Ache, MD;  Location: [facility];  Service: Urology;  Laterality: Right;   JOINT REPLACEMENT Right    partial joint replacement knee   LAPAROSCOPIC GASTRIC RESTRICTIVE DUODENAL PROCEDURE (DUODENAL SWITCH)     2017   lower back     2008   spinal injections     SPINE SURGERY     L4-L5, C6-C7      A IV Location/Drains/Wounds Patient Lines/Drains/Airways Status     Active Line/Drains/Airways  Name Placement date Placement time Site Days   Peripheral IV 10/19/22 20 G Right Antecubital 10/19/22  1724  Antecubital  less than 1   Ureteral Drain/Stent Right ureter 5 Fr. 08/01/17  1248  Right ureter  1905            Intake/Output Last 24 hours  Intake/Output Summary (Last 24 hours) at 10/19/2022 2232 Last data filed at 10/19/2022 2000 Gross per 24 hour  Intake 1050 ml  Output --  Net 1050 ml    Labs/Imaging Results for orders placed or performed during the hospital encounter of 10/19/22 (from the past 48 hour(s))  Lipase, blood     Status: None   Collection Time: 10/19/22  4:53 PM  Result Value Ref Range   Lipase 19 11 - 51 U/L    Comment: Performed at [facility], [address] [place], [place] [address]  Comprehensive  metabolic panel     Status: Abnormal   Collection Time: 10/19/22  4:53 PM  Result Value Ref Range   Sodium 138 135 - 145 mmol/L   Potassium 3.3 (L) 3.5 - 5.1 mmol/L   Chloride 104 98 - 111 mmol/L   CO2 23 22 - 32 mmol/L   Glucose, Bld 107 (H) 70 - 99 mg/dL    Comment: Glucose reference range applies only to samples taken after fasting for at least 8 hours.   BUN 14 8 - 23 mg/dL   Creatinine, Ser 6.04 0.44 - 1.00 mg/dL   Calcium 9.0 8.9 - 54.0 mg/dL   Total Protein 7.1 6.5 - 8.1 g/dL   Albumin 4.0 3.5 - 5.0 g/dL   AST 38 15 - 41 U/L   ALT 51 (H) 0 - 44 U/L   Alkaline Phosphatase 143 (H) 38 - 126 U/L   Total Bilirubin 0.6 0.3 - 1.2 mg/dL   GFR, Estimated >98 >11 mL/min    Comment: (NOTE) Calculated using the CKD-EPI Creatinine Equation (2021)    Anion gap 11 5 - 15    Comment: Performed at [facility], [address], [place], [place] [address]  CBC     Status: Abnormal   Collection Time: 10/19/22  4:53 PM  Result Value Ref Range   WBC 17.3 (H) 4.0 - 10.5 K/uL   RBC 4.70 3.87 - 5.11 MIL/uL   Hemoglobin 13.9 12.0 - 15.0 g/dL   HCT 29.5 62.1 - 30.8 %   MCV 89.8 80.0 - 100.0 fL   MCH 29.6 26.0 - 34.0 pg   MCHC 32.9 30.0 - 36.0 g/dL   RDW 65.7 84.6 - 96.2 %   Platelets 401 (H) 150 - 400 K/uL   nRBC 0.0 0.0 - 0.2 %    Comment: Performed at [facility], [address] [place], [place] [address]  Brain natriuretic peptide     Status: None   Collection Time: 10/19/22  4:53 PM  Result Value Ref Range   B Natriuretic Peptide 56.0 0.0 - 100.0 pg/mL    Comment: Performed at [facility], [address] [place], [place] [address]  Urinalysis, Routine w reflex microscopic -Urine, Clean Catch     Status: None   Collection Time: 10/19/22  5:00 PM  Result Value Ref Range   Color, Urine YELLOW YELLOW   APPearance CLEAR CLEAR   Specific Gravity, Urine 1.021 1.005 - 1.030   pH 5.5 5.0 - 8.0   Glucose, UA NEGATIVE NEGATIVE  mg/dL   Hgb urine dipstick NEGATIVE NEGATIVE  Bilirubin Urine NEGATIVE NEGATIVE   Ketones, ur NEGATIVE NEGATIVE mg/dL   Protein, ur NEGATIVE NEGATIVE mg/dL   Nitrite NEGATIVE NEGATIVE   Leukocytes,Ua NEGATIVE NEGATIVE    Comment: Performed at [facility], [address] [place], [place] [address]   CT ABDOMEN PELVIS W CONTRAST  Result Date: 10/19/2022 CLINICAL DATA:  Right lower quadrant abdominal pain EXAM: CT ABDOMEN AND PELVIS WITH CONTRAST TECHNIQUE: Multidetector CT imaging of the abdomen and pelvis was performed using the standard protocol following bolus administration of intravenous contrast. RADIATION DOSE REDUCTION: This exam was performed according to the departmental dose-optimization program which includes automated exposure control, adjustment of the mA and/or kV according to patient size and/or use of iterative reconstruction technique. CONTRAST:  80mL OMNIPAQUE IOHEXOL 300 MG/ML  SOLN COMPARISON:  11/14/2019 FINDINGS: Lower chest: Mild chronic scarring at the lung bases. Small hiatal hernia. Hepatobiliary: Liver parenchyma is normal. Previous cholecystectomy. Pancreas: Normal Spleen: Normal Adrenals/Urinary Tract: Adrenal glands are normal. Left kidney is normal. No mass, stone or hydronephrosis. Right kidney contains a 6 mm stone in the midportion. No hydroureteronephrosis. No stone in either ureter. No stone in the bladder. Stomach/Bowel: Stomach shows previous bariatric surgery. Hiatal hernia as noted above. Small bowel appears unremarkable. The appendix is dilated and there is some inflammatory edema, consistent with acute appendicitis. Diverticulosis without evidence of diverticulitis. Vascular/Lymphatic: Aortic atherosclerosis. No aneurysm. IVC is normal. No adenopathy. Reproductive: No pelvic mass. Other: No free fluid or air. Musculoskeletal: No acute musculoskeletal finding. Curvature and degenerative change of the spine. IMPRESSION: 1. Acute appendicitis. No  evidence of rupture or abscess. The appendix is located in the right iliac fossa. 2. Previous bariatric surgery. Small hiatal hernia. 3. Previous cholecystectomy. 4. Aortic atherosclerosis. 5. 6 mm nonobstructing stone in the midportion of the right kidney. Aortic Atherosclerosis (ICD10-I70.0). Electronically Signed   By: Paulina Fusi M.D.   On: 10/19/2022 18:59    Pending Labs Unresulted Labs (From admission, onward)     Start     Ordered   10/20/22 0500  CBC WITH DIFFERENTIAL  Tomorrow morning,   R        10/19/22 2228   10/19/22 2229  HIV Antibody (routine testing w rflx)  (HIV Antibody (Routine testing w reflex) panel)  Once,   R        10/19/22 2228            Vitals/Pain Today's Vitals   10/19/22 2013 10/19/22 2029 10/19/22 2031 10/19/22 2127  BP:  (!) 142/87  (!) 151/78  Pulse: 87 92  87  Resp:  18  17  Temp:  98 F (36.7 C)  98.7 F (37.1 C)  TempSrc:    Oral  SpO2: 95% 97%  98%  PainSc:   5      Isolation Precautions No active isolations  Medications Medications  aspirin EC tablet 81 mg (has no administration in time range)  amLODipine (NORVASC) tablet 10 mg (has no administration in time range)  atorvastatin (LIPITOR) tablet 40 mg (has no administration in time range)  nitroGLYCERIN (NITROSTAT) SL tablet 0.4 mg (has no administration in time range)  ALPRAZolam (XANAX) tablet 0.5 mg (has no administration in time range)  Venlafaxine HCl TB24 225 mg (has no administration in time range)  topiramate (TOPAMAX) tablet 50 mg (has no administration in time range)  albuterol (VENTOLIN HFA) 108 (90 Base) MCG/ACT inhaler 2 puff (has no administration in time range)  loratadine (CLARITIN) tablet 10 mg (has no administration in  time range)  heparin injection 5,000 Units (has no administration in time range)  lactated ringers infusion (has no administration in time range)  acetaminophen (TYLENOL) tablet 1,000 mg (has no administration in time range)  ibuprofen (ADVIL)  tablet 600 mg (has no administration in time range)  traMADol (ULTRAM) tablet 50 mg (has no administration in time range)  HYDROmorphone (DILAUDID) injection 0.5 mg (has no administration in time range)  diphenhydrAMINE (BENADRYL) 12.5 MG/5ML elixir 12.5 mg (has no administration in time range)    Or  diphenhydrAMINE (BENADRYL) injection 12.5 mg (has no administration in time range)  melatonin tablet 3 mg (has no administration in time range)  ondansetron (ZOFRAN-ODT) disintegrating tablet 4 mg (has no administration in time range)    Or  ondansetron (ZOFRAN) injection 4 mg (has no administration in time range)  simethicone (MYLICON) chewable tablet 40 mg (has no administration in time range)  hydrALAZINE (APRESOLINE) injection 10 mg (has no administration in time range)  ondansetron (ZOFRAN) injection 4 mg (4 mg Intravenous Given 10/19/22 1731)  lactated ringers bolus 1,000 mL (0 mLs Intravenous Stopped 10/19/22 1911)  iohexol (OMNIPAQUE) 300 MG/ML solution 100 mL (80 mLs Intravenous Contrast Given 10/19/22 1823)  piperacillin-tazobactam (ZOSYN) IVPB 3.375 g (0 g Intravenous Stopped 10/19/22 2000)    Mobility walks     Focused Assessments Cardiac Assessment Handoff:  Cardiac Rhythm: Normal sinus rhythm Lab Results  Component Value Date   CKTOTAL 192 (H) 01/16/2015   No results found for: "DDIMER" Does the Patient currently have chest pain? No    R Recommendations: See Admitting Provider Note  Report given to:   Additional Notes:

## 2022-10-19 NOTE — ED Notes (Signed)
Pt with abdominal pain, RUQ reproducible with palpation.

## 2022-10-19 NOTE — Anesthesia Preprocedure Evaluation (Signed)
Anesthesia Evaluation  Patient identified by MRN, date of birth, ID band Patient awake    Reviewed: Allergy & Precautions, Patient's Chart, lab work & pertinent test results  History of Anesthesia Complications (+) PONV and history of anesthetic complications  Airway Mallampati: II  TM Distance: >3 FB Neck ROM: Full    Dental no notable dental hx.    Pulmonary asthma , sleep apnea    Pulmonary exam normal        Cardiovascular hypertension, Pt. on medications + Valvular Problems/Murmurs  Rhythm:Regular Rate:Normal     Neuro/Psych   Anxiety     negative neurological ROS     GI/Hepatic Neg liver ROS,GERD  ,,Acute appendicitis    Endo/Other  negative endocrine ROS    Renal/GU Renal disease  negative genitourinary   Musculoskeletal  (+) Arthritis ,  Fibromyalgia -  Abdominal Normal abdominal exam  (+)   Peds  Hematology   Anesthesia Other Findings   Reproductive/Obstetrics                             Anesthesia Physical Anesthesia Plan  ASA: 2  Anesthesia Plan: General   Post-op Pain Management: Toradol IV (intra-op)* and Ofirmev IV (intra-op)*   Induction: Intravenous  PONV Risk Score and Plan: 4 or greater and Ondansetron, Dexamethasone, Midazolam and Treatment may vary due to age or medical condition  Airway Management Planned: Oral ETT and Mask  Additional Equipment: None  Intra-op Plan:   Post-operative Plan: Extubation in OR  Informed Consent: I have reviewed the patients History and Physical, chart, labs and discussed the procedure including the risks, benefits and alternatives for the proposed anesthesia with the patient or authorized representative who has indicated his/her understanding and acceptance.     Dental advisory given  Plan Discussed with: CRNA  Anesthesia Plan Comments:        Anesthesia Quick Evaluation

## 2022-10-19 NOTE — ED Provider Notes (Signed)
[facility] Provider Note   CSN: [id number] Arrival date & time: 10/19/22  1628     History Chief Complaint  Patient presents with   Abdominal Pain    HPI [name] [name] is a 67 y.o. female presenting for chief complaint of diffuse abdominal pain.  67 year old female status post bariatric surgery multiple years ago.  States that this morning she woke up with right lower quadrant pain has been present for 6 hours progressive in nature endorses fevers at home as well as anorexia with no p.o. intake today because of loss of appetite.  Denies diarrhea.  No history of similar.  Status post cholecystectomy as part of her bariatric surgery but still has appendix and there.  Also endorses a history of right-sided nephrolithiasis.  Denies any urinary symptoms today otherwise..   Patient's recorded medical, surgical, social, medication list and allergies were reviewed in the Snapshot window as part of the initial history.   Review of Systems   Review of Systems  Constitutional:  Negative for chills and fever.  HENT:  Negative for ear pain and sore throat.   Eyes:  Negative for pain and visual disturbance.  Respiratory:  Negative for cough and shortness of breath.   Cardiovascular:  Negative for chest pain and palpitations.  Gastrointestinal:  Positive for abdominal pain. Negative for vomiting.  Genitourinary:  Negative for dysuria and hematuria.  Musculoskeletal:  Negative for arthralgias and back pain.  Skin:  Negative for color change and rash.  Neurological:  Negative for seizures and syncope.  All other systems reviewed and are negative.   Physical Exam Updated Vital Signs BP (!) 151/78 (BP Location: Left Arm)   Pulse 87   Temp 98.7 F (37.1 C) (Oral)   Resp 17   Ht 5\' 1"  (1.549 m)   Wt 88.9 kg   SpO2 98%   BMI 37.03 kg/m  Physical Exam Vitals and nursing note reviewed.  Constitutional:      General: She is not in acute  distress.    Appearance: She is well-developed.  HENT:     Head: Normocephalic and atraumatic.  Eyes:     Conjunctiva/sclera: Conjunctivae normal.  Cardiovascular:     Rate and Rhythm: Normal rate and regular rhythm.     Heart sounds: No murmur heard. Pulmonary:     Effort: Pulmonary effort is normal. No respiratory distress.     Breath sounds: Normal breath sounds.  Abdominal:     General: There is no distension.     Palpations: Abdomen is soft.     Tenderness: There is abdominal tenderness in the right lower quadrant. There is guarding. There is no right CVA tenderness or left CVA tenderness. Positive signs include McBurney's sign.  Musculoskeletal:        General: No swelling or tenderness. Normal range of motion.     Cervical back: Neck supple.  Skin:    General: Skin is warm and dry.  Neurological:     General: No focal deficit present.     Mental Status: She is alert and oriented to person, place, and time. Mental status is at baseline.     Cranial Nerves: No cranial nerve deficit.      ED Course/ Medical Decision Making/ A&P    Procedures Procedures   Medications Ordered in ED Medications  aspirin EC tablet 81 mg (has no administration in time range)  amLODipine (NORVASC) tablet 10 mg (has no administration in time range)  atorvastatin (LIPITOR) tablet 40 mg (has no administration in time range)  nitroGLYCERIN (NITROSTAT) SL tablet 0.4 mg (has no administration in time range)  ALPRAZolam (XANAX) tablet 0.5 mg (has no administration in time range)  venlafaxine XR (EFFEXOR-XR) 24 hr capsule 225 mg (has no administration in time range)  topiramate (TOPAMAX) tablet 50 mg (has no administration in time range)  loratadine (CLARITIN) tablet 10 mg (has no administration in time range)  heparin injection 5,000 Units (5,000 Units Subcutaneous Given 10/19/22 2237)  lactated ringers infusion ( Intravenous New Bag/Given 10/19/22 2238)  acetaminophen (TYLENOL) tablet 1,000 mg (has  no administration in time range)  ibuprofen (ADVIL) tablet 600 mg (has no administration in time range)  traMADol (ULTRAM) tablet 50 mg (has no administration in time range)  HYDROmorphone (DILAUDID) injection 0.5 mg (has no administration in time range)  diphenhydrAMINE (BENADRYL) 12.5 MG/5ML elixir 12.5 mg (has no administration in time range)    Or  diphenhydrAMINE (BENADRYL) injection 12.5 mg (has no administration in time range)  melatonin tablet 3 mg (has no administration in time range)  ondansetron (ZOFRAN-ODT) disintegrating tablet 4 mg (has no administration in time range)    Or  ondansetron (ZOFRAN) injection 4 mg (has no administration in time range)  simethicone (MYLICON) chewable tablet 40 mg (has no administration in time range)  hydrALAZINE (APRESOLINE) injection 10 mg (has no administration in time range)  albuterol (PROVENTIL) (2.5 MG/3ML) 0.083% nebulizer solution 2.5 mg (has no administration in time range)  ondansetron (ZOFRAN) injection 4 mg (4 mg Intravenous Given 10/19/22 1731)  lactated ringers bolus 1,000 mL (0 mLs Intravenous Stopped 10/19/22 1911)  iohexol (OMNIPAQUE) 300 MG/ML solution 100 mL (80 mLs Intravenous Contrast Given 10/19/22 1823)  piperacillin-tazobactam (ZOSYN) IVPB 3.375 g (0 g Intravenous Stopped 10/19/22 2000)   Medical Decision Making:   [name] [name] is a 67 y.o. female who presented to the ED today with abdominal pain, detailed above.    Patient's presentation is complicated by their history of bariatric surgery.  Patient placed on continuous vitals and telemetry monitoring while in ED which was reviewed periodically.  Complete initial physical exam performed, notably the patient  was hemodynamically stable no acute distress.     Reviewed and confirmed nursing documentation for past medical history, family history, social history.    Initial Assessment:   With the patient's presentation of abdominal pain, most likely diagnosis is appendicitis  versus nonspecific etiology. Other diagnoses were considered including (but not limited to) gastroenteritis, colitis, small bowel obstruction, appendicitis, cholecystitis, pancreatitis, nephrolithiasis, UTI, pyleonephritis. These are considered less likely due to history of present illness and physical exam findings.   This is most consistent with an acute life/limb threatening illness complicated by underlying chronic conditions.   Initial Plan:  CBC/CMP to evaluate for underlying infectious/metabolic etiology for patient's abdominal pain  Lipase to evaluate for pancreatitis  CTAB/Pelvis with contrast to evaluate for structural/surgical etiology of patients' severe abdominal pain.  Urinalysis and repeat physical assessment to evaluate for UTI/Pyelonpehritis  Empiric management of symptoms with escalating pain control and antiemetics as needed.   Initial Study Results:   Laboratory  All laboratory results reviewed without evidence of clinically relevant pathology.   Exceptions include: Leukocytosis  Radiology All images reviewed independently. Agree with radiology report at this time.   CT ABDOMEN PELVIS W CONTRAST  Result Date: 10/19/2022 CLINICAL DATA:  Right lower quadrant abdominal pain EXAM: CT ABDOMEN AND PELVIS WITH CONTRAST TECHNIQUE: Multidetector CT imaging of the abdomen and  pelvis was performed using the standard protocol following bolus administration of intravenous contrast. RADIATION DOSE REDUCTION: This exam was performed according to the departmental dose-optimization program which includes automated exposure control, adjustment of the mA and/or kV according to patient size and/or use of iterative reconstruction technique. CONTRAST:  80mL OMNIPAQUE IOHEXOL 300 MG/ML  SOLN COMPARISON:  11/14/2019 FINDINGS: Lower chest: Mild chronic scarring at the lung bases. Small hiatal hernia. Hepatobiliary: Liver parenchyma is normal. Previous cholecystectomy. Pancreas: Normal Spleen: Normal  Adrenals/Urinary Tract: Adrenal glands are normal. Left kidney is normal. No mass, stone or hydronephrosis. Right kidney contains a 6 mm stone in the midportion. No hydroureteronephrosis. No stone in either ureter. No stone in the bladder. Stomach/Bowel: Stomach shows previous bariatric surgery. Hiatal hernia as noted above. Small bowel appears unremarkable. The appendix is dilated and there is some inflammatory edema, consistent with acute appendicitis. Diverticulosis without evidence of diverticulitis. Vascular/Lymphatic: Aortic atherosclerosis. No aneurysm. IVC is normal. No adenopathy. Reproductive: No pelvic mass. Other: No free fluid or air. Musculoskeletal: No acute musculoskeletal finding. Curvature and degenerative change of the spine. IMPRESSION: 1. Acute appendicitis. No evidence of rupture or abscess. The appendix is located in the right iliac fossa. 2. Previous bariatric surgery. Small hiatal hernia. 3. Previous cholecystectomy. 4. Aortic atherosclerosis. 5. 6 mm nonobstructing stone in the midportion of the right kidney. Aortic Atherosclerosis (ICD10-I70.0). Electronically Signed   By: Paulina Fusi M.D.   On: 10/19/2022 18:59   US Venous Img Lower Unilateral Left (DVT)  Result Date: 10/07/2022 CLINICAL DATA:  Left calf pain. EXAM: LEFT LOWER EXTREMITY VENOUS DOPPLER ULTRASOUND TECHNIQUE: Gray-scale sonography with compression, as well as color and duplex ultrasound, were performed to evaluate the deep venous system(s) from the level of the common femoral vein through the popliteal and proximal calf veins. COMPARISON:  None Available. FINDINGS: VENOUS Normal compressibility of the common femoral, superficial femoral, and popliteal veins, as well as the visualized calf veins. Visualized portions of profunda femoral vein and great saphenous vein unremarkable. No filling defects to suggest DVT on grayscale or color Doppler imaging. Doppler waveforms show normal direction of venous flow, normal  respiratory plasticity and response to augmentation. Limited views of the contralateral common femoral vein are unremarkable. OTHER None. Limitations: none IMPRESSION: Negative. Electronically Signed   By: Aram Candela M.D.   On: 10/07/2022 19:30    Final Reassessment and Plan:   Findings consistent with appendicitis.  Arrange for admission for further diagnostic care and management.  Consulted surgery who agreed with need for ED to ED transfer and further objective management.  Dr. Freida Busman at the emergency room accepted in transfer.      Clinical Impression:  1. Acute appendicitis, unspecified acute appendicitis type      Admit   Final Clinical Impression(s) / ED Diagnoses Final diagnoses:  Acute appendicitis, unspecified acute appendicitis type    Rx / DC Orders ED Discharge Orders     None         Glyn Ade, MD 10/19/22 2309

## 2022-10-19 NOTE — ED Triage Notes (Addendum)
Pt presents to ED Pov. Pt c/o RQL bloating, nausea, and abd pain since 1000 this morning. pain radiates towards epigastric. Pt describes pain as 9/10, aching and sharp and constant. Pt taken gasX and pepto w/o relief.   Pt also adds that she had noted LE edema.

## 2022-10-19 NOTE — H&P (Signed)
CC: RLQ abdominal pain, appendicitis  HPI: [name] [name] is an 67 y.o. female with hx of pre-DM, HTN, EDS type III, HLD, kidney stones, GERD presented to [facility] with severe RLQ abdominal pain that began earlier today ~10am.  Reports she has never had this kind of pain before.  The pain radiated towards her mid abdomen.  Nothing seem to make it better or worse.  It is described as being sharp and persistent.  Some associated nausea.  No emesis.  No constipation or diarrhea reported.  No blood in her stool.  She has attempted to take both Gas-X and Pepto-Bismol without any improvement.   Colonoscopy 02/2019 with Dr. Lavon Paganini showed congested mucosa at the appendiceal orifice.  This was biopsied and demonstrated: BENIGN POLYPOID COLORECTAL MUCOSA WITH A LYMPHOID AGGREGATE.  No evidence of malignancy.  She has recently noted some swelling in her left leg and had a ultrasound to rule out DVT 10/07/2022 which demonstrated no evidence of DVT.  PSH: Laparoscopic cholecystectomy, laparoscopic duodenal switch, C-sx, neck surgeries  Past Medical History:  Diagnosis Date   Allergy    seasonal   Anxiety    Arthritis    Asthma    Complication of anesthesia    headache after neck surgery   Ehlers-Danlos syndrome type III    Fatty liver    Fibromyalgia    GERD (gastroesophageal reflux disease)    history of   Heart murmur    History of blood in urine    History of bronchitis    History of cholelithiasis    History of kidney stones    Hyperlipemia    Hyperparathyroidism (HCC)    Hypertension    no medication needed since bariatric surgery   Insomnia    Leg pain    Low back pain    Obese    history of   Pneumonia    history of    Pre-diabetes    no since weight loss   Sleep apnea    improved since weight loss    Past Surgical History:  Procedure Laterality Date   achilles tendon tibial tendon fusion     4 surgeries   BREAST BIOPSY Right    BREAST BIOPSY Left    BREAST BIOPSY Left     CERVICAL DISCECTOMY     2010   CESAREAN SECTION     CHOLECYSTECTOMY     COLONOSCOPY     CYSTOSCOPY WITH RETROGRADE PYELOGRAM, URETEROSCOPY AND STENT PLACEMENT Right 08/01/2017   Procedure: CYSTOSCOPY WITH RETROGRADE PYELOGRAM, URETEROSCOPY AND STENT PLACEMENT;  Surgeon: Sebastian Ache, MD;  Location: [facility];  Service: Urology;  Laterality: Right;   HOLMIUM LASER APPLICATION Right 08/01/2017   Procedure: HOLMIUM LASER APPLICATION;  Surgeon: Sebastian Ache, MD;  Location: [facility];  Service: Urology;  Laterality: Right;   JOINT REPLACEMENT Right    partial joint replacement knee   LAPAROSCOPIC GASTRIC RESTRICTIVE DUODENAL PROCEDURE (DUODENAL SWITCH)     2017   lower back     2008   spinal injections     SPINE SURGERY     L4-L5, C6-C7     Family History  Problem Relation Age of Onset   Parkinson's disease Mother        Deceased, 71   Heart disease Father        Deceased, 65   Pancreatic cancer Sister        Survivor   Healthy Daughter    Colon  cancer Neg Hx    Colon polyps Neg Hx    Esophageal cancer Neg Hx    Rectal cancer Neg Hx    Stomach cancer Neg Hx    Breast cancer Neg Hx     Social:  reports that she has never smoked. She has never used smokeless tobacco. She reports current alcohol use. She reports that she does not use drugs.  Allergies: No Known Allergies  Medications: I have reviewed the patient's current medications.  Results for orders placed or performed during the hospital encounter of 10/19/22 (from the past 48 hour(s))  Lipase, blood     Status: None   Collection Time: 10/19/22  4:53 PM  Result Value Ref Range   Lipase 19 11 - 51 U/L    Comment: Performed at [facility], [address] [place], [place] [address]  Comprehensive metabolic panel     Status: Abnormal   Collection Time: 10/19/22  4:53 PM  Result Value Ref Range   Sodium 138 135 - 145 mmol/L   Potassium 3.3 (L) 3.5 - 5.1  mmol/L   Chloride 104 98 - 111 mmol/L   CO2 23 22 - 32 mmol/L   Glucose, Bld 107 (H) 70 - 99 mg/dL    Comment: Glucose reference range applies only to samples taken after fasting for at least 8 hours.   BUN 14 8 - 23 mg/dL   Creatinine, Ser 6.29 0.44 - 1.00 mg/dL   Calcium 9.0 8.9 - 52.8 mg/dL   Total Protein 7.1 6.5 - 8.1 g/dL   Albumin 4.0 3.5 - 5.0 g/dL   AST 38 15 - 41 U/L   ALT 51 (H) 0 - 44 U/L   Alkaline Phosphatase 143 (H) 38 - 126 U/L   Total Bilirubin 0.6 0.3 - 1.2 mg/dL   GFR, Estimated >41 >32 mL/min    Comment: (NOTE) Calculated using the CKD-EPI Creatinine Equation (2021)    Anion gap 11 5 - 15    Comment: Performed at [facility], [address] [place], [place] [address]  CBC     Status: Abnormal   Collection Time: 10/19/22  4:53 PM  Result Value Ref Range   WBC 17.3 (H) 4.0 - 10.5 K/uL   RBC 4.70 3.87 - 5.11 MIL/uL   Hemoglobin 13.9 12.0 - 15.0 g/dL   HCT 27.2 53.6 - 64.4 %   MCV 89.8 80.0 - 100.0 fL   MCH 29.6 26.0 - 34.0 pg   MCHC 32.9 30.0 - 36.0 g/dL   RDW 03.4 74.2 - 59.5 %   Platelets 401 (H) 150 - 400 K/uL   nRBC 0.0 0.0 - 0.2 %    Comment: Performed at [facility], [address], [place], [place] [address]  Brain natriuretic peptide     Status: None   Collection Time: 10/19/22  4:53 PM  Result Value Ref Range   B Natriuretic Peptide 56.0 0.0 - 100.0 pg/mL    Comment: Performed at [facility], [address] [place], [place] [address]  Urinalysis, Routine w reflex microscopic -Urine, Clean Catch     Status: None   Collection Time: 10/19/22  5:00 PM  Result Value Ref Range   Color, Urine YELLOW YELLOW   APPearance CLEAR CLEAR   Specific Gravity, Urine 1.021 1.005 - 1.030   pH 5.5 5.0 - 8.0   Glucose, UA NEGATIVE NEGATIVE mg/dL   Hgb urine dipstick NEGATIVE NEGATIVE   Bilirubin Urine NEGATIVE NEGATIVE  Ketones, ur NEGATIVE NEGATIVE mg/dL   Protein, ur NEGATIVE NEGATIVE mg/dL    Nitrite NEGATIVE NEGATIVE   Leukocytes,Ua NEGATIVE NEGATIVE    Comment: Performed at [facility], [address], [place], [place] [address]    CT ABDOMEN PELVIS W CONTRAST  Result Date: 10/19/2022 CLINICAL DATA:  Right lower quadrant abdominal pain EXAM: CT ABDOMEN AND PELVIS WITH CONTRAST TECHNIQUE: Multidetector CT imaging of the abdomen and pelvis was performed using the standard protocol following bolus administration of intravenous contrast. RADIATION DOSE REDUCTION: This exam was performed according to the departmental dose-optimization program which includes automated exposure control, adjustment of the mA and/or kV according to patient size and/or use of iterative reconstruction technique. CONTRAST:  80mL OMNIPAQUE IOHEXOL 300 MG/ML  SOLN COMPARISON:  11/14/2019 FINDINGS: Lower chest: Mild chronic scarring at the lung bases. Small hiatal hernia. Hepatobiliary: Liver parenchyma is normal. Previous cholecystectomy. Pancreas: Normal Spleen: Normal Adrenals/Urinary Tract: Adrenal glands are normal. Left kidney is normal. No mass, stone or hydronephrosis. Right kidney contains a 6 mm stone in the midportion. No hydroureteronephrosis. No stone in either ureter. No stone in the bladder. Stomach/Bowel: Stomach shows previous bariatric surgery. Hiatal hernia as noted above. Small bowel appears unremarkable. The appendix is dilated and there is some inflammatory edema, consistent with acute appendicitis. Diverticulosis without evidence of diverticulitis. Vascular/Lymphatic: Aortic atherosclerosis. No aneurysm. IVC is normal. No adenopathy. Reproductive: No pelvic mass. Other: No free fluid or air. Musculoskeletal: No acute musculoskeletal finding. Curvature and degenerative change of the spine. IMPRESSION: 1. Acute appendicitis. No evidence of rupture or abscess. The appendix is located in the right iliac fossa. 2. Previous bariatric surgery. Small hiatal hernia. 3. Previous  cholecystectomy. 4. Aortic atherosclerosis. 5. 6 mm nonobstructing stone in the midportion of the right kidney. Aortic Atherosclerosis (ICD10-I70.0). Electronically Signed   By: Paulina Fusi M.D.   On: 10/19/2022 18:59    ROS - all of the below systems have been reviewed with the patient and positives are indicated with bold text General: chills, fever or night sweats Eyes: blurry vision or double vision ENT: epistaxis or sore throat Allergy/Immunology: itchy/watery eyes or nasal congestion Hematologic/Lymphatic: bleeding problems, blood clots or swollen lymph nodes Endocrine: temperature intolerance or unexpected weight changes Breast: new or changing breast lumps or nipple discharge Resp: cough, shortness of breath, or wheezing CV: chest pain or dyspnea on exertion GI: as per HPI GU: dysuria, trouble voiding, or hematuria MSK: joint pain or joint stiffness Neuro: TIA or stroke symptoms Derm: pruritus and skin lesion changes Psych: anxiety and depression  PE Blood pressure 135/69, pulse 84, temperature 98 F (36.7 C), temperature source Oral, resp. rate 18, SpO2 94 %. Constitutional: NAD; conversant Eyes: Moist conjunctiva; anicteric Lungs: Normal respiratory effort CV: RRR; no pitting edema GI: Abd soft, focally ttp along right hemiabdomen without rebound/guarding; no palpable hepatosplenomegaly MSK: Normal range of motion of extremities Psychiatric: Appropriate affect  Results for orders placed or performed during the hospital encounter of 10/19/22 (from the past 48 hour(s))  Lipase, blood     Status: None   Collection Time: 10/19/22  4:53 PM  Result Value Ref Range   Lipase 19 11 - 51 U/L    Comment: Performed at [facility], [address] [place], [place] [address]  Comprehensive metabolic panel     Status: Abnormal   Collection Time: 10/19/22  4:53 PM  Result Value Ref Range   Sodium 138 135 - 145 mmol/L   Potassium 3.3 (L) 3.5 -  5.1 mmol/L    Chloride 104 98 - 111 mmol/L   CO2 23 22 - 32 mmol/L   Glucose, Bld 107 (H) 70 - 99 mg/dL    Comment: Glucose reference range applies only to samples taken after fasting for at least 8 hours.   BUN 14 8 - 23 mg/dL   Creatinine, Ser 9.60 0.44 - 1.00 mg/dL   Calcium 9.0 8.9 - 45.4 mg/dL   Total Protein 7.1 6.5 - 8.1 g/dL   Albumin 4.0 3.5 - 5.0 g/dL   AST 38 15 - 41 U/L   ALT 51 (H) 0 - 44 U/L   Alkaline Phosphatase 143 (H) 38 - 126 U/L   Total Bilirubin 0.6 0.3 - 1.2 mg/dL   GFR, Estimated >09 >81 mL/min    Comment: (NOTE) Calculated using the CKD-EPI Creatinine Equation (2021)    Anion gap 11 5 - 15    Comment: Performed at [facility], [address] [place], [place] [address]  CBC     Status: Abnormal   Collection Time: 10/19/22  4:53 PM  Result Value Ref Range   WBC 17.3 (H) 4.0 - 10.5 K/uL   RBC 4.70 3.87 - 5.11 MIL/uL   Hemoglobin 13.9 12.0 - 15.0 g/dL   HCT 82.9 56.2 - 13.0 %   MCV 89.8 80.0 - 100.0 fL   MCH 29.6 26.0 - 34.0 pg   MCHC 32.9 30.0 - 36.0 g/dL   RDW 86.5 78.4 - 69.6 %   Platelets 401 (H) 150 - 400 K/uL   nRBC 0.0 0.0 - 0.2 %    Comment: Performed at [facility], [address], [place], [place] [address]  Brain natriuretic peptide     Status: None   Collection Time: 10/19/22  4:53 PM  Result Value Ref Range   B Natriuretic Peptide 56.0 0.0 - 100.0 pg/mL    Comment: Performed at [facility], [address] [place], [place] [address]  Urinalysis, Routine w reflex microscopic -Urine, Clean Catch     Status: None   Collection Time: 10/19/22  5:00 PM  Result Value Ref Range   Color, Urine YELLOW YELLOW   APPearance CLEAR CLEAR   Specific Gravity, Urine 1.021 1.005 - 1.030   pH 5.5 5.0 - 8.0   Glucose, UA NEGATIVE NEGATIVE mg/dL   Hgb urine dipstick NEGATIVE NEGATIVE   Bilirubin Urine NEGATIVE NEGATIVE   Ketones, ur NEGATIVE NEGATIVE mg/dL   Protein, ur NEGATIVE NEGATIVE mg/dL   Nitrite  NEGATIVE NEGATIVE   Leukocytes,Ua NEGATIVE NEGATIVE    Comment: Performed at [facility], [address] [place], [place] [address]    CT ABDOMEN PELVIS W CONTRAST  Result Date: 10/19/2022 CLINICAL DATA:  Right lower quadrant abdominal pain EXAM: CT ABDOMEN AND PELVIS WITH CONTRAST TECHNIQUE: Multidetector CT imaging of the abdomen and pelvis was performed using the standard protocol following bolus administration of intravenous contrast. RADIATION DOSE REDUCTION: This exam was performed according to the departmental dose-optimization program which includes automated exposure control, adjustment of the mA and/or kV according to patient size and/or use of iterative reconstruction technique. CONTRAST:  80mL OMNIPAQUE IOHEXOL 300 MG/ML  SOLN COMPARISON:  11/14/2019 FINDINGS: Lower chest: Mild chronic scarring at the lung bases. Small hiatal hernia. Hepatobiliary: Liver parenchyma is normal. Previous cholecystectomy. Pancreas: Normal Spleen: Normal Adrenals/Urinary Tract: Adrenal glands are normal. Left kidney is normal. No mass, stone or hydronephrosis. Right kidney contains a 6 mm stone in the midportion. No hydroureteronephrosis. No  stone in either ureter. No stone in the bladder. Stomach/Bowel: Stomach shows previous bariatric surgery. Hiatal hernia as noted above. Small bowel appears unremarkable. The appendix is dilated and there is some inflammatory edema, consistent with acute appendicitis. Diverticulosis without evidence of diverticulitis. Vascular/Lymphatic: Aortic atherosclerosis. No aneurysm. IVC is normal. No adenopathy. Reproductive: No pelvic mass. Other: No free fluid or air. Musculoskeletal: No acute musculoskeletal finding. Curvature and degenerative change of the spine. IMPRESSION: 1. Acute appendicitis. No evidence of rupture or abscess. The appendix is located in the right iliac fossa. 2. Previous bariatric surgery. Small hiatal hernia. 3. Previous cholecystectomy. 4.  Aortic atherosclerosis. 5. 6 mm nonobstructing stone in the midportion of the right kidney. Aortic Atherosclerosis (ICD10-I70.0). Electronically Signed   By: Paulina Fusi M.D.   On: 10/19/2022 18:59    I have personally reviewed the relevant CBC, CMP, CT Abd/pelvis  A/P: [name] [name] is an 67 y.o. female with acute appendicitis; hx of laparoscopic duodenal switch  -Admit to ward -NPO after midnight -MIVF -IV abx  -The anatomy and physiology of the GI tract was discussed with the patient. The pathophysiology of appendicitis was discussed as well. -We reviewed options moving forward for treatment, covering IV abx vs surgery. We discussed that with antibiotics alone, there is reasonable success in managing appendicitis, however, risks of recurrence at 44yrs being as high as 40% in some studies. We discussed appendectomy - laparoscopic and potential open techniques as well as scenarios where an ileocecectomy could be necessary. We discussed the material risks (including, but not limited to, pain, bleeding, infection, scarring, need for blood transfusion, damage to surrounding structures- blood vessels/nerves/viscus/organs, damage to ureter/bladder, leak from staple line, need for additional procedures, hernia, recurrence although quite low, pneumonia, heart attack, stroke, death) benefits and alternatives to surgery were discussed. The patient's questions were answered to her satisfaction, she voiced understanding and elected to proceed with surgery. Additionally, we discussed typical postoperative expectations and the recovery process.  -Discussed timing of surgery being later today and would be with my partner, Dr. Sheliah Hatch, whom she would also meet and discuss with prior to surgery as well.  I spent a total of 75 minutes in both face-to-face and non-face-to-face activities, excluding procedures performed, for this visit on the date of this encounter.  Marin Olp, MD [facility]  Surgery, A [facility]

## 2022-10-20 ENCOUNTER — Encounter (HOSPITAL_COMMUNITY)
Admission: EM | Disposition: A | Discharge status: Home / Self Care | Payer: Self-pay | Source: Home / Self Care | Attending: Emergency Medicine

## 2022-10-20 ENCOUNTER — Encounter (HOSPITAL_COMMUNITY): Payer: Self-pay | Admitting: Surgery

## 2022-10-20 ENCOUNTER — Observation Stay (HOSPITAL_COMMUNITY): Payer: Medicare PPO | Admitting: Registered Nurse

## 2022-10-20 ENCOUNTER — Observation Stay (HOSPITAL_BASED_OUTPATIENT_CLINIC_OR_DEPARTMENT_OTHER): Payer: Medicare PPO | Admitting: Registered Nurse

## 2022-10-20 DIAGNOSIS — I10 Essential (primary) hypertension: Secondary | ICD-10-CM | POA: Diagnosis not present

## 2022-10-20 DIAGNOSIS — J45909 Unspecified asthma, uncomplicated: Secondary | ICD-10-CM | POA: Diagnosis not present

## 2022-10-20 DIAGNOSIS — K353 Acute appendicitis with localized peritonitis, without perforation or gangrene: Secondary | ICD-10-CM | POA: Diagnosis not present

## 2022-10-20 DIAGNOSIS — K3580 Unspecified acute appendicitis: Secondary | ICD-10-CM | POA: Diagnosis not present

## 2022-10-20 DIAGNOSIS — K35891 Other acute appendicitis without perforation, with gangrene: Secondary | ICD-10-CM | POA: Diagnosis not present

## 2022-10-20 DIAGNOSIS — G4730 Sleep apnea, unspecified: Secondary | ICD-10-CM | POA: Diagnosis not present

## 2022-10-20 HISTORY — PX: LAPAROSCOPIC APPENDECTOMY: SHX408

## 2022-10-20 LAB — CBC WITH DIFFERENTIAL/PLATELET
Abs Immature Granulocytes: 0.06 10*3/uL (ref 0.00–0.07)
Basophils Absolute: 0 10*3/uL (ref 0.0–0.1)
Basophils Relative: 0 %
Eosinophils Absolute: 0 10*3/uL (ref 0.0–0.5)
Eosinophils Relative: 0 %
HCT: 35.9 % — ABNORMAL LOW (ref 36.0–46.0)
Hemoglobin: 12.2 g/dL (ref 12.0–15.0)
Immature Granulocytes: 0 %
Lymphocytes Relative: 9 %
Lymphs Abs: 1.2 10*3/uL (ref 0.7–4.0)
MCH: 29.8 pg (ref 26.0–34.0)
MCHC: 34 g/dL (ref 30.0–36.0)
MCV: 87.8 fL (ref 80.0–100.0)
Monocytes Absolute: 0.9 10*3/uL (ref 0.1–1.0)
Monocytes Relative: 7 %
Neutro Abs: 11.3 10*3/uL — ABNORMAL HIGH (ref 1.7–7.7)
Neutrophils Relative %: 84 %
Platelets: 317 10*3/uL (ref 150–400)
RBC: 4.09 MIL/uL (ref 3.87–5.11)
RDW: 13.1 % (ref 11.5–15.5)
WBC: 13.5 10*3/uL — ABNORMAL HIGH (ref 4.0–10.5)
nRBC: 0 % (ref 0.0–0.2)

## 2022-10-20 LAB — HIV ANTIBODY (ROUTINE TESTING W REFLEX): HIV Screen 4th Generation wRfx: NONREACTIVE

## 2022-10-20 LAB — SURGICAL PCR SCREEN
MRSA, PCR: NEGATIVE
Staphylococcus aureus: NEGATIVE

## 2022-10-20 SURGERY — APPENDECTOMY, LAPAROSCOPIC
Anesthesia: General

## 2022-10-20 SURGERY — APPENDECTOMY, LAPAROSCOPIC
Anesthesia: General | Site: Abdomen

## 2022-10-20 MED ORDER — BUPIVACAINE HCL (PF) 0.25 % IJ SOLN
INTRAMUSCULAR | Status: AC
  Filled 2022-10-20: qty 30

## 2022-10-20 MED ORDER — ROCURONIUM BROMIDE 10 MG/ML (PF) SYRINGE
PREFILLED_SYRINGE | INTRAVENOUS | Status: DC | PRN
  Administered 2022-10-20: 60 mg via INTRAVENOUS

## 2022-10-20 MED ORDER — PROPOFOL 10 MG/ML IV BOLUS
INTRAVENOUS | Status: DC | PRN
  Administered 2022-10-20: 140 mg via INTRAVENOUS

## 2022-10-20 MED ORDER — FENTANYL CITRATE PF 50 MCG/ML IJ SOSY: 25.0000 ug | PREFILLED_SYRINGE | INTRAMUSCULAR | Status: DC | PRN

## 2022-10-20 MED ORDER — LACTATED RINGERS IV SOLN: INTRAVENOUS | Status: DC

## 2022-10-20 MED ORDER — SODIUM CHLORIDE 0.9 % IR SOLN
Status: DC | PRN
  Administered 2022-10-20: 1000 mL

## 2022-10-20 MED ORDER — DEXAMETHASONE SODIUM PHOSPHATE 10 MG/ML IJ SOLN
INTRAMUSCULAR | Status: DC | PRN
  Administered 2022-10-20: 10 mg via INTRAVENOUS

## 2022-10-20 MED ORDER — OXYCODONE HCL 5 MG PO TABS: 2.5000 mg | ORAL_TABLET | Freq: Four times a day (QID) | ORAL | Status: DC | PRN

## 2022-10-20 MED ORDER — FENTANYL CITRATE (PF) 100 MCG/2ML IJ SOLN: 25.0000 ug | INTRAMUSCULAR | Status: DC | PRN

## 2022-10-20 MED ORDER — AMISULPRIDE (ANTIEMETIC) 5 MG/2ML IV SOLN: 10.0000 mg | Freq: Once | INTRAVENOUS | Status: DC | PRN

## 2022-10-20 MED ORDER — CHLORHEXIDINE GLUCONATE 0.12 % MT SOLN
15.0000 mL | Freq: Once | OROMUCOSAL | Status: AC
  Administered 2022-10-20: 15 mL via OROMUCOSAL
  Filled 2022-10-20: qty 15

## 2022-10-20 MED ORDER — FENTANYL CITRATE (PF) 250 MCG/5ML IJ SOLN
INTRAMUSCULAR | Status: AC
  Filled 2022-10-20: qty 5

## 2022-10-20 MED ORDER — MIDAZOLAM HCL 2 MG/2ML IJ SOLN
INTRAMUSCULAR | Status: AC
  Filled 2022-10-20: qty 2

## 2022-10-20 MED ORDER — ACETAMINOPHEN 500 MG PO TABS
ORAL_TABLET | ORAL | Status: AC
  Administered 2022-10-20: 1000 mg via ORAL
  Filled 2022-10-20: qty 2

## 2022-10-20 MED ORDER — ORAL CARE MOUTH RINSE: 15.0000 mL | OROMUCOSAL | Status: DC | PRN

## 2022-10-20 MED ORDER — FENTANYL CITRATE (PF) 250 MCG/5ML IJ SOLN
INTRAMUSCULAR | Status: DC | PRN
  Administered 2022-10-20: 50 ug via INTRAVENOUS

## 2022-10-20 MED ORDER — SUGAMMADEX SODIUM 200 MG/2ML IV SOLN
INTRAVENOUS | Status: DC | PRN
  Administered 2022-10-20: 50 mg via INTRAVENOUS
  Administered 2022-10-20: 200 mg via INTRAVENOUS

## 2022-10-20 MED ORDER — LIDOCAINE 2% (20 MG/ML) 5 ML SYRINGE
INTRAMUSCULAR | Status: DC | PRN
  Administered 2022-10-20: 60 mg via INTRAVENOUS

## 2022-10-20 MED ORDER — BUPIVACAINE-EPINEPHRINE 0.25% -1:200000 IJ SOLN
INTRAMUSCULAR | Status: DC | PRN
  Administered 2022-10-20: 30 mL

## 2022-10-20 MED ORDER — ONDANSETRON HCL 4 MG/2ML IJ SOLN
INTRAMUSCULAR | Status: DC | PRN
  Administered 2022-10-20 (×2): 4 mg via INTRAVENOUS

## 2022-10-20 MED ORDER — EPHEDRINE SULFATE-NACL 50-0.9 MG/10ML-% IV SOSY
PREFILLED_SYRINGE | INTRAVENOUS | Status: DC | PRN
  Administered 2022-10-20: 5 mg via INTRAVENOUS

## 2022-10-20 MED ORDER — 0.9 % SODIUM CHLORIDE (POUR BTL) OPTIME
TOPICAL | Status: DC | PRN
  Administered 2022-10-20: 1000 mL

## 2022-10-20 MED ORDER — ORAL CARE MOUTH RINSE: 15.0000 mL | Freq: Once | OROMUCOSAL | Status: AC

## 2022-10-20 MED ORDER — MIDAZOLAM HCL 2 MG/2ML IJ SOLN
INTRAMUSCULAR | Status: DC | PRN
  Administered 2022-10-20: 2 mg via INTRAVENOUS

## 2022-10-20 MED ORDER — AMISULPRIDE (ANTIEMETIC) 5 MG/2ML IV SOLN
10.0000 mg | Freq: Once | INTRAVENOUS | Status: DC | PRN
Start: 2022-10-20 — End: 2022-10-20

## 2022-10-20 MED ORDER — CHLORHEXIDINE GLUCONATE 0.12 % MT SOLN
OROMUCOSAL | Status: AC
  Administered 2022-10-20: 15 mL
  Filled 2022-10-20: qty 15

## 2022-10-20 SURGICAL SUPPLY — 47 items
ADH SKN CLS APL DERMABOND .7 (GAUZE/BANDAGES/DRESSINGS) ×1
APL PRP STRL LF DISP 70% ISPRP (MISCELLANEOUS) ×1
APPLIER CLIP 5 13 M/L LIGAMAX5 (MISCELLANEOUS)
APR CLP MED LRG 5 ANG JAW (MISCELLANEOUS)
BAG COUNTER SPONGE SURGICOUNT (BAG) ×1 IMPLANT
BAG SPNG CNTER NS LX DISP (BAG) ×1
CANISTER SUCT 3000ML PPV (MISCELLANEOUS) ×1 IMPLANT
CHLORAPREP W/TINT 26 (MISCELLANEOUS) ×1 IMPLANT
CLIP APPLIE 5 13 M/L LIGAMAX5 (MISCELLANEOUS) IMPLANT
CLIP LIGATING HEMO LOK XL GOLD (MISCELLANEOUS) ×1 IMPLANT
COVER SURGICAL LIGHT HANDLE (MISCELLANEOUS) ×1 IMPLANT
CUTTER FLEX LINEAR 45M (STAPLE) IMPLANT
DERMABOND ADVANCED .7 DNX12 (GAUZE/BANDAGES/DRESSINGS) ×1 IMPLANT
ELECT REM PT RETURN 9FT ADLT (ELECTROSURGICAL) ×1
ELECTRODE REM PT RTRN 9FT ADLT (ELECTROSURGICAL) ×1 IMPLANT
ENDOLOOP SUT PDS II  0 18 (SUTURE)
ENDOLOOP SUT PDS II 0 18 (SUTURE) IMPLANT
GLOVE BIOGEL PI IND STRL 7.0 (GLOVE) ×1 IMPLANT
GLOVE SURG SS PI 7.0 STRL IVOR (GLOVE) ×1 IMPLANT
GOWN STRL REUS W/ TWL LRG LVL3 (GOWN DISPOSABLE) ×3 IMPLANT
GOWN STRL REUS W/TWL LRG LVL3 (GOWN DISPOSABLE) ×3
GRASPER SUT TROCAR 14GX15 (MISCELLANEOUS) ×1 IMPLANT
IRRIG SUCT STRYKERFLOW 2 WTIP (MISCELLANEOUS) ×1
IRRIGATION SUCT STRKRFLW 2 WTP (MISCELLANEOUS) ×1 IMPLANT
KIT BASIN OR (CUSTOM PROCEDURE TRAY) ×1 IMPLANT
KIT TURNOVER KIT B (KITS) ×1 IMPLANT
NDL 22X1.5 STRL (OR ONLY) (MISCELLANEOUS) ×1 IMPLANT
NEEDLE 22X1.5 STRL (OR ONLY) (MISCELLANEOUS) ×1 IMPLANT
NS IRRIG 1000ML POUR BTL (IV SOLUTION) ×1 IMPLANT
PAD ARMBOARD 7.5X6 YLW CONV (MISCELLANEOUS) ×2 IMPLANT
POUCH RETRIEVAL ECOSAC 10 (ENDOMECHANICALS) ×1 IMPLANT
POUCH RETRIEVAL ECOSAC 10MM (ENDOMECHANICALS) ×1
RELOAD STAPLE 45 3.5 BLU ETS (ENDOMECHANICALS) IMPLANT
RELOAD STAPLE TA45 3.5 REG BLU (ENDOMECHANICALS) IMPLANT
SCISSORS LAP 5X35 DISP (ENDOMECHANICALS) ×1 IMPLANT
SET TUBE SMOKE EVAC HIGH FLOW (TUBING) ×1 IMPLANT
SLEEVE Z-THREAD 5X100MM (TROCAR) ×1 IMPLANT
SPECIMEN JAR SMALL (MISCELLANEOUS) ×1 IMPLANT
SUT MNCRL AB 4-0 PS2 18 (SUTURE) ×1 IMPLANT
TOWEL GREEN STERILE (TOWEL DISPOSABLE) ×1 IMPLANT
TOWEL GREEN STERILE FF (TOWEL DISPOSABLE) ×1 IMPLANT
TRAY FOLEY W/BAG SLVR 14FR (SET/KITS/TRAYS/PACK) IMPLANT
TRAY LAPAROSCOPIC MC (CUSTOM PROCEDURE TRAY) ×1 IMPLANT
TROCAR Z THREAD OPTICAL 12X100 (TROCAR) ×1 IMPLANT
TROCAR Z-THREAD OPTICAL 5X100M (TROCAR) ×1 IMPLANT
WARMER LAPAROSCOPE (MISCELLANEOUS) ×1 IMPLANT
WATER STERILE IRR 1000ML POUR (IV SOLUTION) ×1 IMPLANT

## 2022-10-20 NOTE — Op Note (Signed)
Preoperative diagnosis: acute appendicitis  Postoperative diagnosis: gangrenous appendicitis   Procedure: laparoscopic appendectomy  Surgeon: Feliciana Rossetti, M.D.  Asst: none  Anesthesia: Gen.   Indications for procedure: [name] [name] is a 67 y.o. female with symptoms of pain in right lower quadrant and nausea consistent with acute appendicitis. Confirmed by CT.  Description of procedure: The patient was brought into the operative suite, placed supine. Anesthesia was administered with endotracheal tube. The patient's left arm was tucked. All pressure points were offloaded by foam padding. The patient was prepped and draped in the usual sterile fashion.  A transverse incision was made at the left mid abdomen and a 5mm trocar was [place]. Pneumoperitoneum was applied with high flow low pressure.  2 5mm trocars were placed, one in the suprapubic space, one in the supraumbilical area, the left mid abdomen incision was then up-sized and a 12mm trocar placed in that space.  A transversus abdominal block was placed on the left and right sides. Next, the patient was placed in trendelenberg, rotated to the left. The omentum was retracted cephalad. The cecum and appendix were identified. The appendix was adhered to the posterior wall and ileum, these were freed with blunt dissection. The appendix tip appeared inflamed and gangrenous without perforation. The base of the appendix was dissected and a window through the mesoappendix was created with blunt dissection. Large Hem-o-lock clips were used to doubly ligate the base of the appendix and mesoappendix. The appendix was cut free with scissors.  The appendix was placed in a specimen bag. The pelvis and RLQ were irrigated. No purulence was seen in the pelvis. The appendix was removed via the 12 mm trocar. 0 vicryl was used to close the fascial defect. Pneumoperitoneum was removed, all trocars were removed. All incisions were closed with 4-0 monocryl subcuticular  stitch. The patient woke from anesthesia and was brought to PACU in stable condition.  Findings: gangrenous appendicitis  Specimen: appendix  Blood loss: 20 ml  Local anesthesia: 30 ml Marcaine  Complications: none  Feliciana Rossetti, M.D. General, Bariatric, & Minimally Invasive Surgery [facility]

## 2022-10-20 NOTE — Progress Notes (Signed)
Pre Procedure note for inpatients:   [name] [name] has been scheduled for Procedure(s): APPENDECTOMY LAPAROSCOPIC (N/A) today. The various methods of treatment have been discussed with the patient. After consideration of the risks, benefits and treatment options the patient has consented to the planned procedure.   The patient has been seen and labs reviewed. There are no changes in the patient's condition to prevent proceeding with the planned procedure today.  Recent labs:  Lab Results  Component Value Date   WBC 13.5 (H) 10/20/2022   HGB 12.2 10/20/2022   HCT 35.9 (L) 10/20/2022   PLT 317 10/20/2022   GLUCOSE 107 (H) 10/19/2022   CHOL 142 11/20/2020   TRIG 74.0 11/20/2020   HDL 65.30 11/20/2020   LDLCALC 62 11/20/2020   ALT 51 (H) 10/19/2022   AST 38 10/19/2022   NA 138 10/19/2022   K 3.3 (L) 10/19/2022   CL 104 10/19/2022   CREATININE 0.69 10/19/2022   BUN 14 10/19/2022   CO2 23 10/19/2022   TSH 1.81 01/16/2015   HGBA1C 5.5 11/20/2020    Rodman Pickle, MD 10/20/2022 6:54 AM

## 2022-10-20 NOTE — Transfer of Care (Signed)
Immediate Anesthesia Transfer of Care Note  Patient: [name] [name]  Procedure(s) Performed: APPENDECTOMY LAPAROSCOPIC (Abdomen) APPENDECTOMY LAPAROSCOPIC  Patient Location: PACU  Anesthesia Type:General  Level of Consciousness: awake, alert , and oriented  Airway & Oxygen Therapy: Patient Spontanous Breathing and Patient connected to nasal cannula oxygen  Post-op Assessment: Report given to RN and Post -op Vital signs reviewed and stable  Post vital signs: Reviewed and stable  Last Vitals:  Vitals Value Taken Time  BP 112/63 10/20/22 0900  Temp    Pulse 77 10/20/22 0902  Resp 14 10/20/22 0902  SpO2 95 % 10/20/22 0902  Vitals shown include unvalidated device data.  Last Pain:  Vitals:   10/20/22 0652  TempSrc: Oral  PainSc: 0-No pain         Complications: No notable events documented.

## 2022-10-20 NOTE — Anesthesia Postprocedure Evaluation (Signed)
Anesthesia Post Note  Patient: [name] [name]  Procedure(s) Performed: APPENDECTOMY LAPAROSCOPIC (Abdomen) APPENDECTOMY LAPAROSCOPIC     Patient location during evaluation: PACU Anesthesia Type: General Level of consciousness: awake and alert Pain management: pain level controlled Vital Signs Assessment: post-procedure vital signs reviewed and stable Respiratory status: spontaneous breathing, nonlabored ventilation, respiratory function stable and patient connected to nasal cannula oxygen Cardiovascular status: blood pressure returned to baseline and stable Postop Assessment: no apparent nausea or vomiting Anesthetic complications: no   No notable events documented.  Last Vitals:  Vitals:   10/20/22 0930 10/20/22 1125  BP: 119/70 136/72  Pulse: 76 73  Resp: 16 18  Temp: 36.9 C 37.1 C  SpO2: 94% 94%    Last Pain:  Vitals:   10/20/22 1125  TempSrc: Oral  PainSc:                  Nelle Don Evgenia Merriman

## 2022-10-20 NOTE — Anesthesia Procedure Notes (Signed)
Procedure Name: Intubation Date/Time: 10/20/2022 8:11 AM  Performed by: Marny Lowenstein, CRNAPre-anesthesia Checklist: Patient identified, Emergency Drugs available, Suction available and Patient being monitored Patient Re-evaluated:Patient Re-evaluated prior to induction Oxygen Delivery Method: Circle system utilized Preoxygenation: Pre-oxygenation with 100% oxygen Induction Type: IV induction Ventilation: Mask ventilation without difficulty Laryngoscope Size: Miller and 2 Grade View: Grade I Tube type: Oral Tube size: 7.0 mm Number of attempts: 1 Airway Equipment and Method: Patient positioned with wedge pillow and Stylet Placement Confirmation: ETT inserted through vocal cords under direct vision, positive ETCO2 and breath sounds checked- equal and bilateral Secured at: 21 cm Tube secured with: Tape Dental Injury: Teeth and Oropharynx as per pre-operative assessment

## 2022-10-21 ENCOUNTER — Encounter (HOSPITAL_COMMUNITY): Payer: Self-pay | Admitting: General Surgery

## 2022-10-21 ENCOUNTER — Other Ambulatory Visit (HOSPITAL_COMMUNITY): Payer: Self-pay

## 2022-10-21 MED ORDER — ACETAMINOPHEN 500 MG PO TABS: 1000.0000 mg | ORAL_TABLET | Freq: Three times a day (TID) | ORAL | 0 refills | Status: AC | PRN

## 2022-10-21 MED ORDER — OXYCODONE HCL 5 MG PO TABS
2.5000 mg | ORAL_TABLET | Freq: Four times a day (QID) | ORAL | 0 refills | Status: DC | PRN
  Filled 2022-10-21: qty 15, 4d supply, fill #0

## 2022-10-21 MED ORDER — ASPIRIN EC 81 MG PO TBEC: 81.0000 mg | DELAYED_RELEASE_TABLET | Freq: Every day | ORAL | 3 refills | Status: DC

## 2022-10-21 NOTE — Plan of Care (Signed)

## 2022-10-21 NOTE — Discharge Summary (Signed)
Patient ID: [name] [name] [id number] 1956-01-25 67 y.o.  Admit date: 10/19/2022 Discharge date: 10/21/2022  Discharge Diagnosis Acute appendicitis status post laparoscopic appendectomy by Dr. Sheliah Hatch - 10/20/22 Hx Duodenal Switch  Consultants None  Reason for Admission: [name] [name] is an 67 y.o. female with hx of pre-DM, HTN, EDS type III, HLD, kidney stones, GERD presented to [facility] with severe RLQ abdominal pain that began earlier today ~10am.  Reports she has never had this kind of pain before.  The pain radiated towards her mid abdomen.  Nothing seem to make it better or worse.  It is described as being sharp and persistent.  Some associated nausea.  No emesis.  No constipation or diarrhea reported.  No blood in her stool.  She has attempted to take both Gas-X and Pepto-Bismol without any improvement.    Colonoscopy 02/2019 with Dr. Lavon Paganini showed congested mucosa at the appendiceal orifice.  This was biopsied and demonstrated: BENIGN POLYPOID COLORECTAL MUCOSA WITH A LYMPHOID AGGREGATE.  No evidence of malignancy.   She has recently noted some swelling in her left leg and had a ultrasound to rule out DVT 10/07/2022 which demonstrated no evidence of DVT.   PSH: Laparoscopic cholecystectomy, laparoscopic duodenal switch, C-sx, neck surgeries  Procedures Dr. Sheliah Hatch - Laparoscopic Appendectomy - 10/20/22  Hospital Course:  The patient was admitted and underwent a laparoscopic appendectomy.  The patient tolerated the procedure well.  On POD 1, the patient was tolerating a regular diet, voiding well, mobilizing, and pain was controlled with oral pain medications.  The patient was stable for DC home at this time with appropriate follow up made. Patient was advised not to take nsaids with her hx of duodenal switch surgery in the past.   Physical Exam: Gen:  Alert, NAD, pleasant Card:  Reg Pulm:  CTAB, no W/R/R, effort normal Abd: Soft, no distension, appropriately tender around  laparoscopic incisions, no rigidity or guarding and otherwise NT, +BS. Incisions with glue intact appears well and are without drainage, bleeding, or signs of infection  Ext:  No LE edema. MAE's Psych: A&Ox3  Skin: no rashes noted, warm and dry   Allergies as of 10/21/2022   No Known Allergies      Medication List     STOP taking these medications    ibuprofen 200 MG tablet Commonly known as: ADVIL       TAKE these medications    acetaminophen 500 MG tablet Commonly known as: TYLENOL Take 2 tablets (1,000 mg total) by mouth every 8 (eight) hours as needed.   albuterol 108 (90 Base) MCG/ACT inhaler Commonly known as: VENTOLIN HFA Inhale 2 puffs into the lungs every 6 (six) hours as needed for wheezing.   ALPRAZolam 0.5 MG tablet Commonly known as: XANAX TAKE 1 TABLET BY MOUTH 2 TIMES DAILY AS NEEDED FOR ANXIETY.   amLODipine 10 MG tablet Commonly known as: NORVASC TAKE 1 TABLET (10 MG TOTAL) BY MOUTH DAILY.   aspirin EC 81 MG tablet Take 1 tablet (81 mg total) by mouth daily. Swallow whole.   atorvastatin 40 MG tablet Commonly known as: LIPITOR Take 1 tablet (40 mg total) by mouth daily.   BARIATRIC MULTIVITAMINS/IRON PO Take 3 tablets by mouth daily.   cyclobenzaprine 5 MG tablet Commonly known as: FLEXERIL Take 5 mg by mouth 3 (three) times daily as needed for muscle spasms.   fexofenadine 180 MG tablet Commonly known as: ALLEGRA Take 180 mg by mouth daily as needed for  allergies.   nitroGLYCERIN 0.4 MG SL tablet Commonly known as: NITROSTAT Place 1 tablet (0.4 mg total) under the tongue every 5 (five) minutes as needed for chest pain.   oxyCODONE 5 MG immediate release tablet Commonly known as: Oxy IR/ROXICODONE Take 0.5-1 tablets (2.5-5 mg total) by mouth every 6 (six) hours as needed for breakthrough pain.   Semaglutide-Weight Management 0.25 MG/0.5ML Soaj Inject 0.25 mg into the skin once a week.   Semaglutide-Weight Management 0.5 MG/0.5ML  Soaj Inject 0.5 mg into the skin once a week.   Semaglutide-Weight Management 1 MG/0.5ML Soaj Inject 1 mg into the skin once a week.   Semaglutide-Weight Management 1.7 MG/0.75ML Soaj Inject 1.7 mg into the skin once a week.   Semaglutide-Weight Management 2.4 MG/0.75ML Soaj Inject 2.4 mg into the skin once a week.   solifenacin 10 MG tablet Commonly known as: VESICARE Take 1 tablet (10 mg total) by mouth daily.   Venlafaxine HCl 225 MG Tb24 Take 1 tablet (225 mg total) by mouth daily.          Follow-up Information     Myrlene Broker, MD Follow up.   Specialty: Internal Medicine Contact information: [address] [place] [address] [phone]         Reine Just, [place] Follow up.   Specialty: General Surgery Why: Please call to confirm your appointment date and time.  Please arrive 30 minutes prior to your appointment for paperwork.  Please bring a copy of your photo ID and insurance card. Contact information: [address] [place] [address] [phone]                 Signed: Leary Roca, [facility] 10/21/2022, 12:14 PM Please see Amion for pager number during day hours 7:00am-4:30pm

## 2022-10-21 NOTE — Discharge Instructions (Signed)
[facility]  Please arrive at least 30 min before your appointment to complete your check in paperwork.  If you are unable to arrive 30 min prior to your appointment time we may have to cancel or reschedule you. LAPAROSCOPIC SURGERY: POST OP INSTRUCTIONS Always review your discharge instruction sheet given to you by the facility where your surgery was performed. IF YOU HAVE DISABILITY OR FAMILY LEAVE FORMS, YOU MUST BRING THEM TO THE OFFICE FOR PROCESSING.   DO NOT GIVE THEM TO YOUR DOCTOR.  PAIN CONTROL  First take acetaminophen (Tylenol) to control your pain after surgery.  Follow directions on package.  Taking acetaminophen (Tylenol) regularly after surgery will help to control your pain and lower the amount of prescription pain medication you may need.  You should not take more than 3,000 mg (3 grams) of acetaminophen (Tylenol) in 24 hours.  You should not take ibuprofen (Advil), aleve, motrin, naprosyn or other NSAIDs with your history of prior bariatric surgery.   A prescription for pain medication may be given to you upon discharge.  Take your pain medication as prescribed, if you still have uncontrolled pain after taking acetaminophen (Tylenol) or ibuprofen (Advil). Use ice packs to help control pain. If you need a refill on your pain medication, please contact your pharmacy.  They will contact our office to request authorization. Prescriptions will not be filled after 5pm or on week-ends.  HOME MEDICATIONS Take your usually prescribed medications unless otherwise directed.  DIET You should follow a light diet the first few days after arrival home.  Be sure to include lots of fluids daily. Avoid fatty, fried foods.   CONSTIPATION It is common to experience some constipation after surgery and if you are taking pain medication.  Increasing fluid intake and taking a stool softener (such as Colace) will usually help or prevent this problem from occurring.  A mild  laxative (Milk of Magnesia or Miralax) should be taken according to package instructions if there are no bowel movements after 48 hours.  WOUND/INCISION CARE Most patients will experience some swelling and bruising in the area of the incisions.  Ice packs will help.  Swelling and bruising can take several days to resolve.  Unless discharge instructions indicate otherwise, follow guidelines below  STERI-STRIPS - you may remove your outer bandages 48 hours after surgery, and you may shower at that time.  You have steri-strips (small skin tapes) in place directly over the incision.  These strips should be left on the skin for 7-10 days.   DERMABOND/SKIN GLUE - you may shower in 24 hours.  The glue will flake off over the next 2-3 weeks. Any sutures or staples will be removed at the office during your follow-up visit.  ACTIVITIES You may resume regular (light) daily activities beginning the next day--such as daily self-care, walking, climbing stairs--gradually increasing activities as tolerated.  You may have sexual intercourse when it is comfortable.  Refrain from any heavy lifting or straining until approved by your doctor. You may drive when you are no longer taking prescription pain medication, you can comfortably wear a seatbelt, and you can safely maneuver your car and apply brakes.  FOLLOW-UP You should see your doctor in the office for a follow-up appointment approximately 2-3 weeks after your surgery.  You should have been given your post-op/follow-up appointment when your surgery was scheduled.  If you did not receive a post-op/follow-up appointment, make sure that you call for this appointment within a day or  two after you arrive home to insure a convenient appointment time.   WHEN TO CALL YOUR DOCTOR: Fever over 101.0 Inability to urinate Continued bleeding from incision. Increased pain, redness, or drainage from the incision. Increasing abdominal pain  The clinic staff is available  to answer your questions during regular business hours.  Please don't hesitate to call and ask to speak to one of the nurses for clinical concerns.  If you have a medical emergency, go to the nearest emergency room or call 911.  A surgeon from [facility] is always on call at the hospital. [address], [place], [place]  [address] ? [address] [address], [place], [place]   [address] [phone] ? [phone] ? FAX [phone]

## 2022-10-21 NOTE — Care Management Obs Status (Signed)
MEDICARE OBSERVATION STATUS NOTIFICATION   Patient Details  Name: [name] [name] MRN: [id number] Date of Birth: [birth year]   Medicare Observation Status Notification Given:  Yes    Epifanio Lesches, RN 10/21/2022, 10:23 AM

## 2022-10-21 NOTE — Progress Notes (Addendum)
Discharge instructions reviewed with pt.  Copy of instructions given to pt. [facility] filled 1 script for pt, med is ready, will pick up med on way to discharge lounge.  Pt's husband to pick up soon pt states.  Pt d/c'd via wheelchair with belongings to d/c lounge.           Escorted by staff.   Annice Needy, RN SWOT

## 2022-10-22 LAB — SURGICAL PATHOLOGY

## 2022-11-01 DIAGNOSIS — H353132 Nonexudative age-related macular degeneration, bilateral, intermediate dry stage: Secondary | ICD-10-CM | POA: Diagnosis not present

## 2022-11-18 DIAGNOSIS — M13862 Other specified arthritis, left knee: Secondary | ICD-10-CM | POA: Diagnosis not present

## 2022-11-27 ENCOUNTER — Telehealth: Payer: Self-pay | Admitting: *Deleted

## 2022-11-27 DIAGNOSIS — Z01419 Encounter for gynecological examination (general) (routine) without abnormal findings: Secondary | ICD-10-CM | POA: Diagnosis not present

## 2022-11-27 DIAGNOSIS — M1712 Unilateral primary osteoarthritis, left knee: Secondary | ICD-10-CM | POA: Diagnosis not present

## 2022-11-27 DIAGNOSIS — Z0189 Encounter for other specified special examinations: Secondary | ICD-10-CM | POA: Diagnosis not present

## 2022-11-27 DIAGNOSIS — Z6838 Body mass index (BMI) 38.0-38.9, adult: Secondary | ICD-10-CM | POA: Diagnosis not present

## 2022-11-27 DIAGNOSIS — M8580 Other specified disorders of bone density and structure, unspecified site: Secondary | ICD-10-CM | POA: Diagnosis not present

## 2022-11-27 NOTE — Telephone Encounter (Signed)
   Pre-operative Risk Assessment    Patient Name: [name] [name]  DOB: [birth year] MRN: [id number]      Request for Surgical Clearance    Procedure:   LT TOTAL KNEE ARTHROPLASTY CORI KNEE  Date of Surgery:  Clearance 02/06/23                                 Surgeon:  DR. Cassell Smiles Surgeon's Group or Practice Name:  [facility] Phone number:  [phone] Fax number:  [phone]   Type of Clearance Requested:   - Medical    Type of Anesthesia:  Local / Spinal   Additional requests/questions:    SignedElliot Cousin   11/27/2022, 12:55 PM

## 2022-11-27 NOTE — Telephone Encounter (Signed)
Pt has been scheduled for a tele visit,01/16/23 2:00.  Consent on file / medications reconciled.  Will route to the requesting surgeon's office to make them aware clearance will be addressed 01/16/23.

## 2022-11-27 NOTE — Telephone Encounter (Signed)
Pt has been scheduled for a tele visit,01/16/23 2:00.  Consent on file / medications reconciled.     Patient Consent for Virtual Visit        [name] [name] has provided verbal consent on 11/27/2022 for a virtual visit (video or telephone).   CONSENT FOR VIRTUAL VISIT FOR:  [name] [name]  By participating in this virtual visit I agree to the following:  I hereby voluntarily request, consent and authorize [facility] and its employed or contracted physicians, physician assistants, nurse practitioners or other licensed health care professionals (the Practitioner), to provide me with telemedicine health care services (the "Services") as deemed necessary by the treating Practitioner. I acknowledge and consent to receive the Services by the Practitioner via telemedicine. I understand that the telemedicine visit will involve communicating with the Practitioner through live audiovisual communication technology and the disclosure of certain medical information by electronic transmission. I acknowledge that I have been given the opportunity to request an in-person assessment or other available alternative prior to the telemedicine visit and am voluntarily participating in the telemedicine visit.  I understand that I have the right to withhold or withdraw my consent to the use of telemedicine in the course of my care at any time, without affecting my right to future care or treatment, and that the Practitioner or I may terminate the telemedicine visit at any time. I understand that I have the right to inspect all information obtained and/or recorded in the course of the telemedicine visit and may receive copies of available information for a reasonable fee.  I understand that some of the potential risks of receiving the Services via telemedicine include:  Delay or interruption in medical evaluation due to technological equipment failure or disruption; Information transmitted may not be sufficient  (e.g. poor resolution of images) to allow for appropriate medical decision making by the Practitioner; and/or  In rare instances, security protocols could fail, causing a breach of personal health information.  Furthermore, I acknowledge that it is my responsibility to provide information about my medical history, conditions and care that is complete and accurate to the best of my ability. I acknowledge that Practitioner's advice, recommendations, and/or decision may be based on factors not within their control, such as incomplete or inaccurate data provided by me or distortions of diagnostic images or specimens that may result from electronic transmissions. I understand that the practice of medicine is not an exact science and that Practitioner makes no warranties or guarantees regarding treatment outcomes. I acknowledge that a copy of this consent can be made available to me via my patient portal [facility] MyChart), or I can request a printed copy by calling the office of [facility].    I understand that my insurance will be billed for this visit.   I have read or had this consent read to me. I understand the contents of this consent, which adequately explains the benefits and risks of the Services being provided via telemedicine.  I have been provided ample opportunity to ask questions regarding this consent and the Services and have had my questions answered to my satisfaction. I give my informed consent for the services to be provided through the use of telemedicine in my medical care

## 2022-11-27 NOTE — Telephone Encounter (Signed)
Primary Cardiologist:Gayatri Wynell Balloon, MD   Preoperative team, please contact this patient and set up a phone call appointment for 2 weeks prior to planned procedure for further preoperative risk assessment. Please obtain consent and complete medication review. Thank you for your help.   I confirm that guidance regarding antiplatelet and oral anticoagulation therapy has been completed and, if necessary, noted below (none requested).    Levi Aland, NP-C  11/27/2022, 1:46 PM [address] Office [phone] Fax [phone]

## 2022-11-28 DIAGNOSIS — Z1231 Encounter for screening mammogram for malignant neoplasm of breast: Secondary | ICD-10-CM | POA: Diagnosis not present

## 2022-12-12 ENCOUNTER — Telehealth: Payer: Self-pay | Admitting: Internal Medicine

## 2022-12-12 DIAGNOSIS — I2510 Atherosclerotic heart disease of native coronary artery without angina pectoris: Secondary | ICD-10-CM

## 2022-12-12 MED ORDER — NITROGLYCERIN 0.4 MG SL SUBL: 0.4000 mg | SUBLINGUAL_TABLET | SUBLINGUAL | 2 refills | Status: AC | PRN

## 2022-12-12 NOTE — Telephone Encounter (Signed)
Returned call to patient who states this pain "is something new, I've never experienced this before." Says it's only occurred twice, once a week ago, and again abdominal area on L, radiating into shoulder "like I was being electrocuted. It was sharp, like a nerve pain." Occurred about 3 times in a row, went and sat down in a chair next to her bed-lasting about 5 minutes. Second episode occurred later that day. No episodes since. Denies doing any strenuous activity prior to or during episode, condones standing at home shaving at the time. Denies nausea/vomiting/dizziness/SOB/diaphoresis. BP lately has been mid-140's /90's. No episodes since.  Today she noticed that her resting HR is into the 90's which is abnormal for her. Upon walking to get into the car today, she felt like her heart was pounding and her rate was 107bpm per apple watch. Denies any symptoms associated with it. Patient states she hasn't tried any NTG with episodes and requests refill on it. Medication sent at this time. Will route to MD for thoughts.   At last OV with Jacques Navy on 03/14/22: Mostly, she complains of an occasional "twinge" of pain inferior to her left breast, and sometimes aches in her left arm as well. She feels worn out during exertion or frequent moving/shifting while she is reclining on a sofa or bed.

## 2022-12-12 NOTE — Telephone Encounter (Signed)
Pt c/o of Chest Pain: STAT if CP now or developed within 24 hours  1. Are you having CP right now? no  2. Are you experiencing any other symptoms (ex. SOB, nausea, vomiting, sweating)?   - No but she had a high hr, she states "walking it was 107 and I've checked it several times since then, it's been in the 90's"   - She also stated "please let them know it was radiating through the abdomen area up to my shoulder and it was very very intense"   3. How long have you been experiencing CP? Only happened once a few days ago and states "sometimes when I walk outside in the heat I get chest discomfort"   4. Is your CP continuous or coming and going? Continuous for about 5-10 minutes   5. Have you taken Nitroglycerin? No  ?

## 2022-12-12 NOTE — Telephone Encounter (Signed)
Call went Straight to VM.Marland Kitchen LM to call office

## 2022-12-12 NOTE — Telephone Encounter (Signed)
Patient was returning phone call 

## 2022-12-16 NOTE — Telephone Encounter (Signed)
Left message for pt to call back  °

## 2022-12-17 NOTE — Telephone Encounter (Signed)
[name] Poisson, MD  You    Please offer her an overbook appt 1pm 6/28, or July 1 last of the morning. Needs ekg at visit. GA   Spoke with pt to schedule return office visit per Dr. Jacques Navy. Pt able to come tomorrow (7/3) at 10:20am. Pt verbalizes understanding.

## 2022-12-17 NOTE — Telephone Encounter (Signed)
Left message for pt to call back  °

## 2022-12-18 ENCOUNTER — Ambulatory Visit: Payer: Medicare PPO | Attending: Internal Medicine | Admitting: Internal Medicine

## 2022-12-18 ENCOUNTER — Encounter: Payer: Self-pay | Admitting: Internal Medicine

## 2022-12-18 VITALS — BP 146/88 | HR 93 | Ht 59.0 in | Wt 195.2 lb

## 2022-12-18 DIAGNOSIS — E785 Hyperlipidemia, unspecified: Secondary | ICD-10-CM

## 2022-12-18 DIAGNOSIS — E669 Obesity, unspecified: Secondary | ICD-10-CM

## 2022-12-18 DIAGNOSIS — Q796 Ehlers-Danlos syndrome, unspecified: Secondary | ICD-10-CM

## 2022-12-18 DIAGNOSIS — G4733 Obstructive sleep apnea (adult) (pediatric): Secondary | ICD-10-CM

## 2022-12-18 DIAGNOSIS — I10 Essential (primary) hypertension: Secondary | ICD-10-CM | POA: Diagnosis not present

## 2022-12-18 DIAGNOSIS — G2581 Restless legs syndrome: Secondary | ICD-10-CM

## 2022-12-18 DIAGNOSIS — Z01810 Encounter for preprocedural cardiovascular examination: Secondary | ICD-10-CM

## 2022-12-18 DIAGNOSIS — I2510 Atherosclerotic heart disease of native coronary artery without angina pectoris: Secondary | ICD-10-CM

## 2022-12-18 NOTE — Patient Instructions (Signed)
Medication Instructions:  Your physician recommends that you continue on your current medications as directed. Please refer to the Current Medication list given to you today.  *If you need a refill on your cardiac medications before your next appointment, please call your pharmacy*   Lab Work: NONE If you have labs (blood work) drawn today and your tests are completely normal, you will receive your results only by: MyChart Message (if you have MyChart) OR A paper copy in the mail If you have any lab test that is abnormal or we need to change your treatment, we will call you to review the results.   Testing/Procedures: NONE   Follow-Up: At [facility], you and your health needs are our priority.  As part of our continuing mission to provide you with exceptional heart care, we have created designated Provider Care Teams.  These Care Teams include your primary Cardiologist (physician) and Advanced Practice Providers (APPs -  Physician Assistants and Nurse Practitioners) who all work together to provide you with the care you need, when you need it.  We recommend signing up for the patient portal called "MyChart".  Sign up information is provided on this After Visit Summary.  MyChart is used to connect with patients for Virtual Visits (Telemedicine).  Patients are able to view lab/test results, encounter notes, upcoming appointments, etc.  Non-urgent messages can be sent to your provider as well.   To learn more about what you can do with MyChart, go to [email or web address].    Your next appointment:   6 month(s)  Provider:   Gayatri A Acharya, MD    

## 2022-12-18 NOTE — Progress Notes (Signed)
Cardiology Office Note:    Date:  12/18/2022  ID:  [name] [name], DOB September 26, 1955, MRN [id number]  PCP:  Myrlene Broker, MD  Cardiologist:  Parke Poisson, MD  Electrophysiologist:  None   Referring MD: Myrlene Broker, *   Chief Complaint/Reason for Referral: Chest pain  History of Present Illness:    [name] [name] is a 67 y.o. female with a history of Ehlers-Danlos syndrome type III, fatty liver, fibromyalgia, hypertension (resolved upon weight loss), hyperlipidemia, prediabetes and history of obesity s/p bariatric surgery in 2017. Symptoms seem most consistent with esophageal spasm after food was stuck briefly in her esophagus by subjective report on hospital presentation. Due to CAC on CTPE, Q waves on ECG and risk factors, we determined in shared decision making to perform CCTA as an outpatient, showing moderate, nonobstructive CAD. We discussed medical management of CAD.  She called in with an episode of left-sided chest discomfort, and we arranged this acute care visit.  On further review, her discomfort started in her left groin and radiated to the left shoulder.  This is all in the setting of recent appendectomy, and per patient report there were left-sided port placements.  She had a postoperative visit with the surgical PA where she was felt to have normal postoperative recovery with no concern for postoperative issues.  She did have protracted postoperative pain, but again this was felt to be normal at her postop visit.  We discussed that the most likely etiology of this left groin pain that radiated to the left shoulder may be mobilization of scar tissue.  She has had no other concerning symptoms.  She does on occasion get a twinge under her left breast, but it is fleeting and infrequent.  I have asked her to observe this symptom.  She has known moderate nonobstructive CAD and a previously normal echocardiogram in the setting of a known abnormal EKG, likely  representing artifact.  She anticipates left knee surgery in August at an ambulatory surgical center.  Her surgeon raised concerns about her cardiac issues but the patient and I discussed these in shared decision-making and feel that they are overall very stable.  In addition she just recently underwent appendectomy with general anesthesia and did well from a cardiopulmonary standpoint afterward.  She is tolerating current medical therapy well, blood pressure elevated today but she reports that this is overall otherwise normal she will begin to track this more closely.  She has not yet begun semaglutide and has not yet pursued PCSK9 inhibitor due to recent issues with appendicitis hospitalization.  These are upcoming and she anticipates starting soon.   Past Medical History:  Diagnosis Date   Allergy    seasonal   Anxiety    Arthritis    Asthma    Complication of anesthesia    headache after neck surgery   Ehlers-Danlos syndrome type III    Fatty liver    Fibromyalgia    GERD (gastroesophageal reflux disease)    history of   Heart murmur    History of blood in urine    History of bronchitis    History of cholelithiasis    History of kidney stones    Hyperlipemia    Hyperparathyroidism (HCC)    Hypertension    no medication needed since bariatric surgery   Insomnia    Leg pain    Low back pain    Obese    history of   Pneumonia    history  of    PONV (postoperative nausea and vomiting)    Pre-diabetes    no since weight loss   Sleep apnea    improved since weight loss    Past Surgical History:  Procedure Laterality Date   achilles tendon tibial tendon fusion     4 surgeries   BREAST BIOPSY Right    BREAST BIOPSY Left    BREAST BIOPSY Left    CERVICAL DISCECTOMY     2010   CESAREAN SECTION     CHOLECYSTECTOMY     COLONOSCOPY     CYSTOSCOPY WITH RETROGRADE PYELOGRAM, URETEROSCOPY AND STENT PLACEMENT Right 08/01/2017   Procedure: CYSTOSCOPY WITH RETROGRADE  PYELOGRAM, URETEROSCOPY AND STENT PLACEMENT;  Surgeon: Sebastian Ache, MD;  Location: [facility];  Service: Urology;  Laterality: Right;   HOLMIUM LASER APPLICATION Right 08/01/2017   Procedure: HOLMIUM LASER APPLICATION;  Surgeon: Sebastian Ache, MD;  Location: [facility];  Service: Urology;  Laterality: Right;   JOINT REPLACEMENT Right    partial joint replacement knee   LAPAROSCOPIC APPENDECTOMY N/A 10/20/2022   Procedure: APPENDECTOMY LAPAROSCOPIC;  Surgeon: Kinsinger, De Blanch, MD;  Location: [facility] OR;  Service: General;  Laterality: N/A;   LAPAROSCOPIC GASTRIC RESTRICTIVE DUODENAL PROCEDURE (DUODENAL SWITCH)     2017   lower back     2008   spinal injections     SPINE SURGERY     L4-L5, C6-C7     Current Medications: Current Meds  Medication Sig   acetaminophen (TYLENOL) 500 MG tablet Take 2 tablets (1,000 mg total) by mouth every 8 (eight) hours as needed.   albuterol (VENTOLIN HFA) 108 (90 Base) MCG/ACT inhaler Inhale 2 puffs into the lungs every 6 (six) hours as needed for wheezing.   ALPRAZolam (XANAX) 0.5 MG tablet TAKE 1 TABLET BY MOUTH 2 TIMES DAILY AS NEEDED FOR ANXIETY.   amLODipine (NORVASC) 10 MG tablet TAKE 1 TABLET (10 MG TOTAL) BY MOUTH DAILY.   atorvastatin (LIPITOR) 40 MG tablet Take 1 tablet (40 mg total) by mouth daily.   cyclobenzaprine (FLEXERIL) 5 MG tablet Take 5 mg by mouth 3 (three) times daily as needed for muscle spasms.   fexofenadine (ALLEGRA) 180 MG tablet Take 180 mg by mouth daily as needed for allergies.   Multiple Vitamins-Minerals (BARIATRIC MULTIVITAMINS/IRON PO) Take 3 tablets by mouth daily.    nitroGLYCERIN (NITROSTAT) 0.4 MG SL tablet Place 1 tablet (0.4 mg total) under the tongue every 5 (five) minutes as needed for chest pain.   Semaglutide-Weight Management 0.25 MG/0.5ML SOAJ Inject 0.25 mg into the skin once a week.   Semaglutide-Weight Management 0.5 MG/0.5ML SOAJ Inject 0.5 mg into the skin once a week.    Semaglutide-Weight Management 1 MG/0.5ML SOAJ Inject 1 mg into the skin once a week.   Semaglutide-Weight Management 1.7 MG/0.75ML SOAJ Inject 1.7 mg into the skin once a week.   Semaglutide-Weight Management 2.4 MG/0.75ML SOAJ Inject 2.4 mg into the skin once a week.   solifenacin (VESICARE) 10 MG tablet Take 1 tablet (10 mg total) by mouth daily.   Venlafaxine HCl 225 MG TB24 Take 1 tablet (225 mg total) by mouth daily.     Allergies:   Patient has no known allergies.   Social History   Tobacco Use   Smoking status: Never   Smokeless tobacco: Never  Vaping Use   Vaping Use: Never used  Substance Use Topics   Alcohol use: Yes    Alcohol/week: 0.0 standard drinks of  alcohol    Comment: rare   Drug use: No     Family History: The patient's family history includes Healthy in her daughter; Heart disease in her father; Pancreatic cancer in her sister; Parkinson's disease in her mother. There is no history of Colon cancer, Colon polyps, Esophageal cancer, Rectal cancer, Stomach cancer, or Breast cancer.  ROS:   Please see the history of present illness.   All other systems reviewed and are negative.  EKGs/Labs/Other Studies Reviewed:    The following studies were reviewed today:  Echo 03/30/2021:  IMPRESSIONS   1. Left ventricular ejection fraction, by estimation, is 65 to 70%. Left  ventricular ejection fraction by 3D volume is 68 %. The left ventricle has  normal function. The left ventricle has no regional wall motion  abnormalities. Left ventricular diastolic   parameters were normal.   2. Right ventricular systolic function is normal. The right ventricular  size is normal. Tricuspid regurgitation signal is inadequate for assessing  PA pressure.   3. The mitral valve is grossly normal. Trivial mitral valve  regurgitation. No evidence of mitral stenosis.   4. The aortic valve was not well visualized. Aortic valve regurgitation  is not visualized. No aortic stenosis is  present.  Comparison(s): A prior study was performed on 03/09/2007. No significant  change from prior study.   Coronary CTA 12/08/2019: IMPRESSION: 1. Moderate CAD in proximal LAD, CADRADS = 3. CT FFR will be performed and reported separately. 2. Coronary calcium score is 62, which places the patient in the 78th percentile for age and sex matched control. 3. Normal coronary origin with right dominance.  CT FFR Analysis 12/08/2019:  FINDINGS: FFRct analysis was performed on the original cardiac CT angiogram dataset. Diagrammatic representation of the FFRct analysis is provided in a separate PDF document in PACS. This dictation was created using the PDF document and an interactive 3D model of the results. 3D model is not available in the EMR/PACS. Normal FFR range is >0.80. Indeterminate (grey) zone is 0.76-0.80. 1. Left Main: FFR = 0.98 2. LAD: Proximal FFR = 0.93, Mid FFR = 0.90, Distal FFR = 0.83 3. LCX: Proximal FFR = 0.97, Distal FFR = 0.94 OM FFR = 0.87 4. RCA: Proximal FFR = 0.95, Mid FFR =0.90, Distal FFR = 0.89 IMPRESSION: 1.  CT FFR analysis showed no significant stenosis. RECOMMENDATIONS: Goal directed medical therapy and aggressive risk factor modification for secondary prevention of coronary artery disease   EKG:  EKG is personally reviewed. EKG Interpretation Date/Time:  Wednesday December 18 2022 10:31:16 EDT Ventricular Rate:  93 PR Interval:  174 QRS Duration:  90 QT Interval:  342 QTC Calculation: 425 R Axis:   3  Text Interpretation: Normal sinus rhythm Possible Inferior infarct (cited on or before 22-Aug-2006) Possible Anterior infarct (cited on or before 22-Aug-2006) Confirmed by Weston Brass ([id number]) on 12/18/2022 10:52:22 AM   09/06/22: NSR with inferior infarct pattern 03/14/2022: Sinus rhythm. Inferior and anterior infarct pattern.  09/13/2021: NSR, inf and ant infarct pattern   Recent Labs: 10/19/2022: ALT 51; B Natriuretic Peptide 56.0; BUN 14;  Creatinine, Ser 0.69; Potassium 3.3; Sodium 138 10/20/2022: Hemoglobin 12.2; Platelets 317  Recent Lipid Panel    Component Value Date/Time   CHOL 142 11/20/2020 1041   TRIG 74.0 11/20/2020 1041   HDL 65.30 11/20/2020 1041   CHOLHDL 2 11/20/2020 1041   VLDL 14.8 11/20/2020 1041   LDLCALC 62 11/20/2020 1041    Physical Exam:    VS:  BP (!) 146/88   Pulse 93   Ht 4\' 11"  (1.499 m)   Wt 195 lb 3.2 oz (88.5 kg)   SpO2 93%   BMI 39.43 kg/m     Wt Readings from Last 5 Encounters:  12/18/22 195 lb 3.2 oz (88.5 kg)  10/19/22 195 lb 15.8 oz (88.9 kg)  10/16/22 195 lb 15.8 oz (88.9 kg)  09/06/22 196 lb (88.9 kg)  08/20/22 196 lb (88.9 kg)    Constitutional: No acute distress Eyes: sclera non-icteric, normal conjunctiva and lids ENMT: normal dentition, moist mucous membranes Cardiovascular: regular rhythm, normal rate, no murmurs. S1 and S2 normal. No jugular venous distention.  Respiratory: clear to auscultation bilaterally GI : normal bowel sounds, soft and nontender. No distention.   MSK: extremities warm, well perfused. No edema.  NEURO: grossly nonfocal exam, moves all extremities. PSYCH: alert and oriented x 3, normal mood and affect.   ASSESSMENT:    1. Hyperlipidemia, unspecified hyperlipidemia type   2. Coronary artery disease involving native coronary artery of native heart without angina pectoris   3. Primary hypertension   4. Ehlers-Danlos syndrome   5. Obesity (BMI 35.0-39.9 without comorbidity)   6. Restless leg   7. OSA (obstructive sleep apnea)     PLAN:    Preoperative cardiovascular risk assessment The patient is intermediate risk for intermediate risk procedure/general anesthesia.  No further cardiovascular testing is required prior to the procedure.  Risk level is felt to be nonmodifiable at this time.  If this level of risk is acceptable to the patient and surgical team, the patient should be considered optimized from a cardiovascular standpoint.   Fortunately the patient did well from a cardiovascular standpoint after recent appendectomy with general anesthesia.  Coronary artery disease involving native coronary artery of native heart without angina pectoris  - Plan: EKG 12-Lead-stable today - continue ASA 81 mg daily and statin - sublingual nitro for chest pain, likely esophageal spasm.  No worrisome today. - discussed medical management of CAD today. Fortunately risk factors are mostly optimized.  Essential hypertension -BP stable at home, mildly elevated in the office, continue amlodipine 10 mg daily.  She will monitor her blood pressure at home  HLD  - continue atorvastatin 40 mg daily.  - Last labs 10/2021: total cholesterol 163, triglycerides 126, HDL 65, and LDL 73. - Pt agreeable to meeting with pharmacist to discuss PCSK9 inhibitor options- she she will also begin semaglutide soon.  Ehlers-Danlos syndrome Palpitations - No vascular ectasias noted in visualized portions of CCTA. No mitral valve prolapse, mild MR. - She has palpitations only after lying down in bed at night- usually brief- likely associated with this positional change.  No recent palpitations.  DOE Obesity  OSA Fatigue - Prior diagnosis of OSA. - Repeat sleep study 04/2022 showed frequent periodic limb movements but no significant OSA. - Likely fatigued due to poor sleep associated with her period limb movement disorder- she has established neuro care with Dr. Nita Sickle.  Total time of encounter: 30 minutes total time of encounter, including 20 minutes spent in face-to-face patient care on the date of this encounter. This time includes coordination of care and counseling regarding above mentioned problem list. Remainder of non-face-to-face time involved reviewing chart documents/testing relevant to the patient encounter and documentation in the medical record. I have independently reviewed documentation from referring provider.   Weston Brass, MD,  [facility]  [facility]    Medication Adjustments/Labs and Tests Ordered: Current medicines  are reviewed at length with the patient today.  Concerns regarding medicines are outlined above.   Orders Placed This Encounter  Procedures   EKG 12-Lead   No orders of the defined types were placed in this encounter.  Patient Instructions  Medication Instructions:  Your physician recommends that you continue on your current medications as directed. Please refer to the Current Medication list given to you today.  *If you need a refill on your cardiac medications before your next appointment, please call your pharmacy*   Lab Work: NONE If you have labs (blood work) drawn today and your tests are completely normal, you will receive your results only by: MyChart Message (if you have MyChart) OR A paper copy in the mail If you have any lab test that is abnormal or we need to change your treatment, we will call you to review the results.   Testing/Procedures: NONE   Follow-Up: At [facility], you and your health needs are our priority.  As part of our continuing mission to provide you with exceptional heart care, we have created designated Provider Care Teams.  These Care Teams include your primary Cardiologist (physician) and Advanced Practice Providers (APPs -  Physician Assistants and Nurse Practitioners) who all work together to provide you with the care you need, when you need it.  We recommend signing up for the patient portal called "MyChart".  Sign up information is provided on this After Visit Summary.  MyChart is used to connect with patients for Virtual Visits (Telemedicine).  Patients are able to view lab/test results, encounter notes, upcoming appointments, etc.  Non-urgent messages can be sent to your provider as well.   To learn more about what you can do with MyChart, go to [email or web address].    Your next appointment:   6 month(s)  Provider:    Parke Poisson, MD

## 2022-12-29 ENCOUNTER — Other Ambulatory Visit: Payer: Self-pay

## 2022-12-29 ENCOUNTER — Emergency Department (HOSPITAL_BASED_OUTPATIENT_CLINIC_OR_DEPARTMENT_OTHER)
Admission: EM | Admit: 2022-12-29 | Discharge: 2022-12-29 | Disposition: A | Discharge status: Home / Self Care | Payer: Medicare PPO | Attending: Emergency Medicine | Admitting: Emergency Medicine

## 2022-12-29 ENCOUNTER — Encounter (HOSPITAL_BASED_OUTPATIENT_CLINIC_OR_DEPARTMENT_OTHER): Payer: Self-pay | Admitting: Emergency Medicine

## 2022-12-29 DIAGNOSIS — R09A2 Foreign body sensation, throat: Secondary | ICD-10-CM | POA: Diagnosis present

## 2022-12-29 DIAGNOSIS — W44F3XA Food entering into or through a natural orifice, initial encounter: Secondary | ICD-10-CM

## 2022-12-29 DIAGNOSIS — I10 Essential (primary) hypertension: Secondary | ICD-10-CM | POA: Diagnosis not present

## 2022-12-29 DIAGNOSIS — T18128A Food in esophagus causing other injury, initial encounter: Secondary | ICD-10-CM | POA: Insufficient documentation

## 2022-12-29 DIAGNOSIS — Z79899 Other long term (current) drug therapy: Secondary | ICD-10-CM | POA: Insufficient documentation

## 2022-12-29 DIAGNOSIS — X58XXXA Exposure to other specified factors, initial encounter: Secondary | ICD-10-CM | POA: Diagnosis not present

## 2022-12-29 LAB — CBC WITH DIFFERENTIAL/PLATELET
Abs Immature Granulocytes: 0.01 10*3/uL (ref 0.00–0.07)
Basophils Absolute: 0.1 10*3/uL (ref 0.0–0.1)
Basophils Relative: 1 %
Eosinophils Absolute: 0.1 10*3/uL (ref 0.0–0.5)
Eosinophils Relative: 1 %
HCT: 43.2 % (ref 36.0–46.0)
Hemoglobin: 14.1 g/dL (ref 12.0–15.0)
Immature Granulocytes: 0 %
Lymphocytes Relative: 30 %
Lymphs Abs: 2.2 10*3/uL (ref 0.7–4.0)
MCH: 29.6 pg (ref 26.0–34.0)
MCHC: 32.6 g/dL (ref 30.0–36.0)
MCV: 90.8 fL (ref 80.0–100.0)
Monocytes Absolute: 0.6 10*3/uL (ref 0.1–1.0)
Monocytes Relative: 8 %
Neutro Abs: 4.5 10*3/uL (ref 1.7–7.7)
Neutrophils Relative %: 60 %
Platelets: 399 10*3/uL (ref 150–400)
RBC: 4.76 MIL/uL (ref 3.87–5.11)
RDW: 12.9 % (ref 11.5–15.5)
WBC: 7.5 10*3/uL (ref 4.0–10.5)
nRBC: 0 % (ref 0.0–0.2)

## 2022-12-29 LAB — COMPREHENSIVE METABOLIC PANEL
ALT: 30 U/L (ref 0–44)
AST: 30 U/L (ref 15–41)
Albumin: 4.4 g/dL (ref 3.5–5.0)
Alkaline Phosphatase: 153 U/L — ABNORMAL HIGH (ref 38–126)
Anion gap: 12 (ref 5–15)
BUN: 19 mg/dL (ref 8–23)
CO2: 25 mmol/L (ref 22–32)
Calcium: 9.6 mg/dL (ref 8.9–10.3)
Chloride: 107 mmol/L (ref 98–111)
Creatinine, Ser: 0.58 mg/dL (ref 0.44–1.00)
GFR, Estimated: >60 mL/min (ref >60)
Glucose, Bld: 103 mg/dL — ABNORMAL HIGH (ref 70–99)
Potassium: 3.1 mmol/L — ABNORMAL LOW (ref 3.5–5.1)
Sodium: 144 mmol/L (ref 135–145)
Total Bilirubin: 0.7 mg/dL (ref 0.3–1.2)
Total Protein: 7.3 g/dL (ref 6.5–8.1)

## 2022-12-29 LAB — MAGNESIUM: Magnesium: 2.1 mg/dL (ref 1.7–2.4)

## 2022-12-29 MED ORDER — ONDANSETRON 4 MG PO TBDP: 4.0000 mg | ORAL_TABLET | Freq: Three times a day (TID) | ORAL | 0 refills | Status: DC | PRN

## 2022-12-29 MED ORDER — SODIUM CHLORIDE 0.9 % IV SOLN
12.5000 mg | Freq: Four times a day (QID) | INTRAVENOUS | Status: DC | PRN
  Administered 2022-12-29: 12.5 mg via INTRAVENOUS
  Filled 2022-12-29: qty 0.5

## 2022-12-29 MED ORDER — GLUCAGON HCL RDNA (DIAGNOSTIC) 1 MG IJ SOLR
1.0000 mg | Freq: Once | INTRAMUSCULAR | Status: AC
  Administered 2022-12-29: 1 mg via INTRAVENOUS
  Filled 2022-12-29: qty 1

## 2022-12-29 MED ORDER — PROMETHAZINE HCL 25 MG/ML IJ SOLN
INTRAMUSCULAR | Status: AC
  Filled 2022-12-29: qty 1

## 2022-12-29 MED ORDER — SODIUM CHLORIDE 0.9 % IV BOLUS
1000.0000 mL | Freq: Once | INTRAVENOUS | Status: AC
  Administered 2022-12-29: 1000 mL via INTRAVENOUS

## 2022-12-29 NOTE — Discharge Instructions (Signed)
Your history, exam, and evaluation today are consistent with a food bolus getting stuck in your esophagus causing the symptoms for the last 24 hours.  We were able to have improvement in her symptoms with the medications and feel you passed it.  Please use the nausea medicine to help maintain hydration and follow-up with your GI team.  As we discussed, if symptoms were to change recur, or worsen, we recommend returning to the nearest emergency department but more so would recommend going to [facility] or [facility] where the would have more capabilities to have GI see you for endoscopy.  Please rest and stay hydrated.

## 2022-12-29 NOTE — ED Triage Notes (Signed)
Unable to swallow. Spitting saliva. nausea Has had this before  Feels like something may be stuck.

## 2022-12-29 NOTE — ED Provider Notes (Signed)
[place] EMERGENCY DEPARTMENT AT [facility] Provider Note   CSN: [id number] Arrival date & time: 12/29/22  1652     History  Chief Complaint  Patient presents with   Globus    [name] [name] is a 67 y.o. female.  The history is provided by the patient, medical records and the spouse. No language interpreter was used.  Swallowed Foreign Body This is a new problem. The current episode started yesterday. The problem occurs constantly. The problem has not changed since onset.Pertinent negatives include no chest pain, no abdominal pain, no headaches and no shortness of breath. Nothing aggravates the symptoms. Nothing relieves the symptoms. She has tried nothing for the symptoms. The treatment provided no relief.       Home Medications Prior to Admission medications   Medication Sig Start Date End Date Taking? Authorizing Provider  acetaminophen (TYLENOL) 500 MG tablet Take 2 tablets (1,000 mg total) by mouth every 8 (eight) hours as needed. 10/21/22   Maczis, Elmer Sow, PA-C  albuterol (VENTOLIN HFA) 108 (90 Base) MCG/ACT inhaler Inhale 2 puffs into the lungs every 6 (six) hours as needed for wheezing. 11/20/20   Myrlene Broker, MD  ALPRAZolam Prudy Feeler) 0.5 MG tablet TAKE 1 TABLET BY MOUTH 2 TIMES DAILY AS NEEDED FOR ANXIETY. 09/23/22   Myrlene Broker, MD  amLODipine (NORVASC) 10 MG tablet TAKE 1 TABLET (10 MG TOTAL) BY MOUTH DAILY. 02/20/22   Myrlene Broker, MD  atorvastatin (LIPITOR) 40 MG tablet Take 1 tablet (40 mg total) by mouth daily. 02/20/22   Myrlene Broker, MD  cyclobenzaprine (FLEXERIL) 5 MG tablet Take 5 mg by mouth 3 (three) times daily as needed for muscle spasms. 10/07/22   [provider]  fexofenadine (ALLEGRA) 180 MG tablet Take 180 mg by mouth daily as needed for allergies.    [provider]  Multiple Vitamins-Minerals (BARIATRIC MULTIVITAMINS/IRON PO) Take 3 tablets by mouth daily.     [provider]   nitroGLYCERIN (NITROSTAT) 0.4 MG SL tablet Place 1 tablet (0.4 mg total) under the tongue every 5 (five) minutes as needed for chest pain. 12/12/22 03/12/23  Parke Poisson, MD  Semaglutide-Weight Management 0.25 MG/0.5ML SOAJ Inject 0.25 mg into the skin once a week. 10/16/22   Parke Poisson, MD  Semaglutide-Weight Management 0.5 MG/0.5ML SOAJ Inject 0.5 mg into the skin once a week. 10/16/22   Parke Poisson, MD  Semaglutide-Weight Management 1 MG/0.5ML SOAJ Inject 1 mg into the skin once a week. 10/16/22   Parke Poisson, MD  Semaglutide-Weight Management 1.7 MG/0.75ML SOAJ Inject 1.7 mg into the skin once a week. 10/16/22   Parke Poisson, MD  Semaglutide-Weight Management 2.4 MG/0.75ML SOAJ Inject 2.4 mg into the skin once a week. 10/16/22   Parke Poisson, MD  solifenacin (VESICARE) 10 MG tablet Take 1 tablet (10 mg total) by mouth daily. 05/24/22   Myrlene Broker, MD  Venlafaxine HCl 225 MG TB24 Take 1 tablet (225 mg total) by mouth daily. 03/05/22   Myrlene Broker, MD      Allergies    Patient has no known allergies.    Review of Systems   Review of Systems  Constitutional:  Negative for chills, fatigue and fever.  HENT:  Positive for trouble swallowing. Negative for congestion.   Respiratory:  Positive for choking. Negative for cough, shortness of breath and wheezing.   Cardiovascular:  Negative for chest pain and palpitations.  Gastrointestinal:  Positive  for nausea. Negative for abdominal pain, constipation, diarrhea and vomiting.  Genitourinary:  Negative for flank pain.  Musculoskeletal:  Negative for back pain.  Skin:  Negative for rash.  Neurological:  Negative for light-headedness and headaches.  Psychiatric/Behavioral:  Negative for agitation.   All other systems reviewed and are negative.   Physical Exam Updated Vital Signs BP (!) 134/107 (BP Location: Right Arm)   Pulse 84   Temp 99.2 F (37.3 C) (Oral)   Resp 20   SpO2 100%  Physical  Exam Vitals and nursing note reviewed.  Constitutional:      General: She is not in acute distress.    Appearance: She is well-developed. She is not ill-appearing, toxic-appearing or diaphoretic.  HENT:     Head: Normocephalic and atraumatic.     Nose: No congestion or rhinorrhea.     Mouth/Throat:     Mouth: Mucous membranes are dry.     Pharynx: No oropharyngeal exudate or posterior oropharyngeal erythema.  Eyes:     Extraocular Movements: Extraocular movements intact.     Conjunctiva/sclera: Conjunctivae normal.     Pupils: Pupils are equal, round, and reactive to light.  Neck:     Vascular: No carotid bruit.  Cardiovascular:     Rate and Rhythm: Normal rate and regular rhythm.     Pulses: Normal pulses.     Heart sounds: No murmur heard. Pulmonary:     Effort: Pulmonary effort is normal. No respiratory distress.     Breath sounds: Normal breath sounds. No stridor. No wheezing, rhonchi or rales.  Chest:     Chest wall: No tenderness.  Abdominal:     Palpations: Abdomen is soft.     Tenderness: There is no abdominal tenderness. There is no guarding or rebound.  Musculoskeletal:        General: No swelling or tenderness.     Cervical back: Neck supple. No tenderness.     Right lower leg: No edema.     Left lower leg: No edema.  Skin:    General: Skin is warm and dry.     Capillary Refill: Capillary refill takes less than 2 seconds.     Findings: No erythema.  Neurological:     General: No focal deficit present.     Mental Status: She is alert.  Psychiatric:        Mood and Affect: Mood normal.     ED Results / Procedures / Treatments   Labs (all labs ordered are listed, but only abnormal results are displayed) Labs Reviewed  COMPREHENSIVE METABOLIC PANEL - Abnormal; Notable for the following components:      Result Value   Potassium 3.1 (*)    Glucose, Bld 103 (*)    Alkaline Phosphatase 153 (*)    All other components within normal limits  CBC WITH  DIFFERENTIAL/PLATELET  MAGNESIUM    EKG None  Radiology No results found.  Procedures Procedures    Medications Ordered in ED Medications  promethazine (PHENERGAN) 12.5 mg in sodium chloride 0.9 % 50 mL IVPB (0 mg Intravenous Stopped 12/29/22 1935)  promethazine (PHENERGAN) 25 MG/ML injection (  Not Given 12/29/22 1950)  sodium chloride 0.9 % bolus 1,000 mL (0 mLs Intravenous Stopped 12/29/22 2128)  glucagon (human recombinant) (GLUCAGEN) injection 1 mg (1 mg Intravenous Given 12/29/22 1922)    ED Course/ Medical Decision Making/ A&P  Medical Decision Making Amount and/or Complexity of Data Reviewed Labs: ordered.  Risk Prescription drug management.    [name] [name] is a 67 y.o. female with a past medical history significant for hypertension, hyperlipidemia, hyperparathyroidism, Ehlers-Danlos syndrome, previous cholecystectomy, GERD, bariatric surgery, appendectomy, and previous esophageal dilation who presents with recurrent choking on food.  According to patient, she had the first bite of her husband's pork last night for dinner about 24 hours ago when she felt to get stuck in her throat.  She reports this happens either every month or every other month but normally it passes without too much difficulty.  She reports she tried drinking some soda but did not seem to help.  She reports is now been about 24 hours and she is not tolerating any oral intake and is spitting her own saliva out constantly.  She denies any other chest pain or shortness of breath and had some only mild nausea.  She reports she feels the food stuck in her chest but does not have significant pain.  She reports it feels similar to prior.  She reports she has had esophageal dilation many years ago but otherwise does not see a GI doctor.  She denies any other trauma.  She reports she is having some cramping in her legs and thinks her electrolytes may be off from getting dehydrated over  the last 24 hours.  Denies any other trauma.  On exam, lungs clear.  Chest nontender.  Abdomen nontender.  Good pulses in extremities.  Some crepitance in the left knee that she reports she is can have replaced soon.  Mucous membranes are dry and oropharyngeal exam did not show visible foreign body.  No stridor on my exam.  Clinically I do suspect food bolus as she has had in the past.  Will get screening labs and give her some fluids given suspect dehydration and make sure she does not have electrolyte abnormality.  Patient would like to medication see if it will pass so we will give some glucagon, fluids, and Phenergan and try some Coca-Cola.  If this does not work, anticipate discussion with gastroenterology for likely transfer and endoscopy.  Anticipate reassessment after workup.  10:05 PM After medications, patient reports that she felt the meat passed and was able to slowly tolerate eating and drinking.  She kept crackers down without vomiting and is tolerating her secretions.  She kept the soda down.  Given improvement, suspect she passed the food bolus.  Do not feel she needs transfer for endoscopy tonight.  We do feel she is to follow-up with outpatient GI as she will likely need endoscopy.  Patient agrees with plan of care.  Will give prescription for Zofran ODT as she requested and will discharge home.         Final Clinical Impression(s) / ED Diagnoses Final diagnoses:  Food impaction of esophagus, initial encounter    Rx / DC Orders ED Discharge Orders          Ordered    ondansetron (ZOFRAN-ODT) 4 MG disintegrating tablet  Every 8 hours PRN        12/29/22 2209            Clinical Impression: 1. Food impaction of esophagus, initial encounter     Disposition: Discharge  Condition: Good  I have discussed the results, Dx and Tx plan with the pt(& family if present). He/she/they expressed understanding and agree(s) with the plan. Discharge instructions  discussed at great length. Strict  return precautions discussed and pt &/or family have verbalized understanding of the instructions. No further questions at time of discharge.    New Prescriptions   ONDANSETRON (ZOFRAN-ODT) 4 MG DISINTEGRATING TABLET    Take 1 tablet (4 mg total) by mouth every 8 (eight) hours as needed for nausea or vomiting.    Follow Up: Gastroenterology, Deboraha Sprang [address] [place] [address] [phone]     [facility] [address] [place] [address] [phone]    [facility] [address] [place] [address] [place] [address] [phone] Schedule an appointment as soon as possible for a visit        Phil Michels, Canary Brim, MD 12/29/22 2209

## 2023-01-02 ENCOUNTER — Telehealth: Payer: Self-pay

## 2023-01-02 NOTE — Telephone Encounter (Signed)
Transition Care Management Unsuccessful Follow-up Telephone Call  Date of discharge and from where:  [facility] 7/14  Attempts:  1st Attempt  Reason for unsuccessful TCM follow-up call:  No answer/busy   Lenard Forth [facility] Guide, [facility] [phone] [address] [place], [place], [place] [address] Phone: [phone] Email: [email or web address]

## 2023-01-03 ENCOUNTER — Telehealth: Payer: Self-pay

## 2023-01-03 NOTE — Telephone Encounter (Signed)
Transition Care Management Unsuccessful Follow-up Telephone Call  Date of discharge and from where:  [facility] 7/14  Attempts:  2nd Attempt  Reason for unsuccessful TCM follow-up call:  No answer/busy   Lenard Forth [facility] Guide, [facility] [phone] [address] [place], [place], [place] [address] Phone: [phone] Email: [email or web address]

## 2023-01-09 ENCOUNTER — Encounter: Payer: Self-pay | Admitting: Gastroenterology

## 2023-01-16 ENCOUNTER — Encounter: Payer: Self-pay | Admitting: Gastroenterology

## 2023-01-16 ENCOUNTER — Ambulatory Visit: Payer: Medicare PPO | Admitting: Gastroenterology

## 2023-01-16 VITALS — BP 132/76 | HR 98 | Ht 61.0 in | Wt 195.4 lb

## 2023-01-16 DIAGNOSIS — Z0189 Encounter for other specified special examinations: Secondary | ICD-10-CM | POA: Diagnosis not present

## 2023-01-16 DIAGNOSIS — R1310 Dysphagia, unspecified: Secondary | ICD-10-CM

## 2023-01-16 DIAGNOSIS — Z01812 Encounter for preprocedural laboratory examination: Secondary | ICD-10-CM | POA: Diagnosis not present

## 2023-01-16 DIAGNOSIS — E785 Hyperlipidemia, unspecified: Secondary | ICD-10-CM | POA: Diagnosis not present

## 2023-01-16 NOTE — Progress Notes (Signed)
Chief Complaint: Hospital follow up for food impaction Primary GI MD: Dr. Lavon Paganini  HPI: 67 year old female history of hypertension, hyperparathyroidism, Ehlers-Danlos syndrome, s/p cholecystectomy, GERD, bariatric surgery, appendectomy (10/2022), previous esophageal dilations presents for hospital follow-up.  Recently seen 12/29/2022 for food impaction.  She ate pork dinner 24 hours prior to emergency department visit and reported food was stuck in her chest.  Patient reported previous dilation in the past but did not have a current GI doctor.  Patient preferred conservative management so she was given glucagon, fluids, Phenergan, and Coca-Cola.  After medications she reported me had passed and she was slowly able to tolerate eating and drinking.  No endoscopy was pursued and patient was discharged with outpatient GI follow-up  CMP 12/29/2022 showed elevated alk phos 153 and hypokalemia with potassium 3.1 No anemia or leukocytosis  Patient reports continued dysphagia worse with solids.  States she feels it gets stuck anywhere between her suprasternal notch and xiphoid process.  Rare GERD.  Trigger foods include stringy meat such as [employer] roast beef.  She has had EGD with dilation in the past with Dr. Ewing Schlein.  Also reports EGD prior to her bariatric surgery in 2017.  No issue with liquids.  When she has these episodes of food getting caught she has severe retching with no regurgitation.  She feels like it gets "locked up" in her esophagus  Patient would like to be tested for eosinophilic esophagitis.  She feels certain spices and certain foods bother her and with her previous autoimmune history and positive ANA she wants to rule out EOE  Denies lower GI symptoms    PREVIOUS GI WORKUP   CT abdomen pelvis with contrast 10/19/2022 done for right lower quadrant abdominal pain - Acute appendicitis without rupture or abscess - Bariatric surgery.  Small hiatal hernia  Colonoscopy 11/18/2018 for  screening - Moderate diverticulosis in the sigmoid colon, in the descending colon, in the transverse colon and in the ascending colon.  - Congested mucosa at the appendiceal orifice. Biopsied.  - Non- bleeding internal hemorrhoids. Due for repeat 2030  Past Medical History:  Diagnosis Date   Allergy    seasonal   Anxiety    Arthritis    Asthma    Complication of anesthesia    headache after neck surgery   Ehlers-Danlos syndrome type III    Fatty liver    Fibromyalgia    GERD (gastroesophageal reflux disease)    history of   Heart murmur    History of blood in urine    History of bronchitis    History of cholelithiasis    History of kidney stones    Hyperlipemia    Hyperparathyroidism (HCC)    Hypertension    no medication needed since bariatric surgery   Insomnia    Leg pain    Low back pain    Obese    history of   Pneumonia    history of    PONV (postoperative nausea and vomiting)    Pre-diabetes    no since weight loss   Sleep apnea    improved since weight loss    Past Surgical History:  Procedure Laterality Date   achilles tendon tibial tendon fusion     4 surgeries   BREAST BIOPSY Right    BREAST BIOPSY Left    BREAST BIOPSY Left    CERVICAL DISCECTOMY     2010   CESAREAN SECTION     CHOLECYSTECTOMY     COLONOSCOPY  CYSTOSCOPY WITH RETROGRADE PYELOGRAM, URETEROSCOPY AND STENT PLACEMENT Right 08/01/2017   Procedure: CYSTOSCOPY WITH RETROGRADE PYELOGRAM, URETEROSCOPY AND STENT PLACEMENT;  Surgeon: Sebastian Ache, MD;  Location: [facility];  Service: Urology;  Laterality: Right;   HOLMIUM LASER APPLICATION Right 08/01/2017   Procedure: HOLMIUM LASER APPLICATION;  Surgeon: Sebastian Ache, MD;  [facility];  Service: Urology;  Laterality: Right;   JOINT REPLACEMENT Right    partial joint replacement knee   LAPAROSCOPIC APPENDECTOMY N/A 10/20/2022   Procedure: APPENDECTOMY LAPAROSCOPIC;  Surgeon: Kinsinger, De Blanch, MD;  Location: [facility] OR;  Service: General;  Laterality: N/A;   LAPAROSCOPIC GASTRIC RESTRICTIVE DUODENAL PROCEDURE (DUODENAL SWITCH)     2017   lower back     2008   spinal injections     SPINE SURGERY     L4-L5, C6-C7     Current Outpatient Medications  Medication Sig Dispense Refill   acetaminophen (TYLENOL) 500 MG tablet Take 2 tablets (1,000 mg total) by mouth every 8 (eight) hours as needed. 30 tablet 0   albuterol (VENTOLIN HFA) 108 (90 Base) MCG/ACT inhaler Inhale 2 puffs into the lungs every 6 (six) hours as needed for wheezing. 6.7 g 3   ALPRAZolam (XANAX) 0.5 MG tablet TAKE 1 TABLET BY MOUTH 2 TIMES DAILY AS NEEDED FOR ANXIETY. 60 tablet 5   amLODipine (NORVASC) 10 MG tablet TAKE 1 TABLET (10 MG TOTAL) BY MOUTH DAILY. 90 tablet 3   atorvastatin (LIPITOR) 40 MG tablet Take 1 tablet (40 mg total) by mouth daily. 90 tablet 3   cyclobenzaprine (FLEXERIL) 5 MG tablet Take 5 mg by mouth 3 (three) times daily as needed for muscle spasms.     fexofenadine (ALLEGRA) 180 MG tablet Take 180 mg by mouth daily as needed for allergies.     Multiple Vitamins-Minerals (BARIATRIC MULTIVITAMINS/IRON PO) Take 3 tablets by mouth daily.      nitroGLYCERIN (NITROSTAT) 0.4 MG SL tablet Place 1 tablet (0.4 mg total) under the tongue every 5 (five) minutes as needed for chest pain. 25 tablet 2   ondansetron (ZOFRAN-ODT) 4 MG disintegrating tablet Take 1 tablet (4 mg total) by mouth every 8 (eight) hours as needed for nausea or vomiting. 20 tablet 0   Semaglutide-Weight Management 0.25 MG/0.5ML SOAJ Inject 0.25 mg into the skin once a week. 2 mL 1   Semaglutide-Weight Management 0.5 MG/0.5ML SOAJ Inject 0.5 mg into the skin once a week. 2 mL 1   Semaglutide-Weight Management 1 MG/0.5ML SOAJ Inject 1 mg into the skin once a week. 2 mL 1   Semaglutide-Weight Management 1.7 MG/0.75ML SOAJ Inject 1.7 mg into the skin once a week. 2 mL 1   Semaglutide-Weight Management 2.4 MG/0.75ML SOAJ Inject 2.4 mg into  the skin once a week. 2 mL 1   solifenacin (VESICARE) 10 MG tablet Take 1 tablet (10 mg total) by mouth daily. 90 tablet 3   Venlafaxine HCl 225 MG TB24 Take 1 tablet (225 mg total) by mouth daily. 90 tablet 3   No current facility-administered medications for this visit.    Allergies as of 01/16/2023   (No Known Allergies)    Family History  Problem Relation Age of Onset   Parkinson's disease Mother        Deceased, 85   Heart disease Father        Deceased, 15   Pancreatic cancer Sister        Survivor   Healthy Daughter  Colon cancer Neg Hx    Colon polyps Neg Hx    Esophageal cancer Neg Hx    Rectal cancer Neg Hx    Stomach cancer Neg Hx    Breast cancer Neg Hx     Social History   Socioeconomic History   Marital status: Married    Spouse name: Not on file   Number of children: Not on file   Years of education: Not on file   Highest education level: Not on file  Occupational History   Not on file  Tobacco Use   Smoking status: Never   Smokeless tobacco: Never  Vaping Use   Vaping status: Never Used  Substance and Sexual Activity   Alcohol use: Yes    Alcohol/week: 0.0 standard drinks of alcohol    Comment: rare   Drug use: No   Sexual activity: Not on file  Other Topics Concern   Not on file  Social History Narrative   Lives with husband in a one story home.  Has 1 daughter.     On disability in 2009.  Used to work as an Magazine features editor at [employer]      Social Determinants of Health   Financial Resource Strain: Low Risk  (11/27/2021)   Overall Financial Resource Strain (CARDIA)    Difficulty of Paying Living Expenses: Not hard at all  Food Insecurity: No Food Insecurity (10/19/2022)   Hunger Vital Sign    Worried About Running Out of Food in the Last Year: Never true    Ran Out of Food in the Last Year: Never true  Transportation Needs: No Transportation Needs (10/19/2022)   PRAPARE - Administrator, Civil Service (Medical): No    Lack of  Transportation (Non-Medical): No  Physical Activity: Sufficiently Active (11/27/2021)   Exercise Vital Sign    Days of Exercise per Week: 5 days    Minutes of Exercise per Session: 30 min  Stress: No Stress Concern Present (11/27/2021)   [employer] of Occupational Health - Occupational Stress Questionnaire    Feeling of Stress : Not at all  Social Connections: Moderately Isolated (11/27/2021)   Social Connection and Isolation Panel [NHANES]    Frequency of Communication with Friends and Family: More than three times a week    Frequency of Social Gatherings with Friends and Family: More than three times a week    Attends Religious Services: Never    Database administrator or Organizations: No    Attends Banker Meetings: Never    Marital Status: Married  Catering manager Violence: Not At Risk (10/19/2022)   Humiliation, Afraid, Rape, and Kick questionnaire    Fear of Current or Ex-Partner: No    Emotionally Abused: No    Physically Abused: No    Sexually Abused: No    Review of Systems:    Constitutional: No weight loss, fever, chills, weakness or fatigue HEENT: Eyes: No change in vision               Ears, Nose, Throat:  No change in hearing or congestion Skin: No rash or itching Cardiovascular: No chest pain, chest pressure or palpitations   Respiratory: No SOB or cough Gastrointestinal: See HPI and otherwise negative Genitourinary: No dysuria or change in urinary frequency Neurological: No headache, dizziness or syncope Musculoskeletal: No new muscle or joint pain Hematologic: No bleeding or bruising Psychiatric: No history of depression or anxiety    Physical Exam:  Vital signs: There  were no vitals taken for this visit.  Constitutional: NAD, Well developed, Well nourished, alert and cooperative Head:  Normocephalic and atraumatic. Eyes:   PEERL, EOMI. No icterus. Conjunctiva pink. Respiratory: Respirations even and unlabored. Lungs clear to  auscultation bilaterally.   No wheezes, crackles, or rhonchi.  Cardiovascular:  Regular rate and rhythm. No peripheral edema, cyanosis or pallor.  Gastrointestinal:  Soft, nondistended, nontender. No rebound or guarding. Normal bowel sounds. No appreciable masses or hepatomegaly. Rectal:  Not performed.  Msk:  Symmetrical without gross deformities. Without edema, no deformity or joint abnormality.  Neurologic:  Alert and  oriented x4;  grossly normal neurologically.  Skin:   Dry and intact without significant lesions or rashes. Psychiatric: Oriented to person, place and time. Demonstrates good judgement and reason without abnormal affect or behaviors.   RELEVANT LABS AND IMAGING: CBC    Component Value Date/Time   WBC 7.5 12/29/2022 1924   RBC 4.76 12/29/2022 1924   HGB 14.1 12/29/2022 1924   HCT 43.2 12/29/2022 1924   PLT 399 12/29/2022 1924   MCV 90.8 12/29/2022 1924   MCV 91.1 09/09/2012 1937   MCH 29.6 12/29/2022 1924   MCHC 32.6 12/29/2022 1924   RDW 12.9 12/29/2022 1924   LYMPHSABS 2.2 12/29/2022 1924   MONOABS 0.6 12/29/2022 1924   EOSABS 0.1 12/29/2022 1924   BASOSABS 0.1 12/29/2022 1924    CMP     Component Value Date/Time   NA 144 12/29/2022 1924   K 3.1 (L) 12/29/2022 1924   CL 107 12/29/2022 1924   CO2 25 12/29/2022 1924   GLUCOSE 103 (H) 12/29/2022 1924   BUN 19 12/29/2022 1924   CREATININE 0.58 12/29/2022 1924   CALCIUM 9.6 12/29/2022 1924   PROT 7.3 12/29/2022 1924   ALBUMIN 4.4 12/29/2022 1924   AST 30 12/29/2022 1924   ALT 30 12/29/2022 1924   ALKPHOS 153 (H) 12/29/2022 1924   BILITOT 0.7 12/29/2022 1924   GFRNONAA >60 12/29/2022 1924   GFRAA >60 11/15/2019 1502     Assessment/Plan:   Dysphagia, unspecified type Dysphagia with solids which includes retching without regurgitation.  Concern for EOE with history of autoimmune disorders.  Has had previous EGDs in the past, most recent in 2017 (unknown where) --- EGD with possible dilation for  further evaluation --- I thoroughly discussed the procedure with the patient (at bedside) to include nature of the procedure, alternatives, benefits, and risks (including but not limited to bleeding, infection, perforation, anesthesia/cardiac pulmonary complications).  Patient verbalized understanding and gave verbal consent to proceed with procedure.  --- If EGD is negative, will consider esophageal manometry for persistent dysphagia --- Recommended to avoid trigger foods and eat frequent small meals throughout the day with adequate chewing   Shalaya Swailes Jolee Ewing [facility] 01/16/2023, 11:00 AM  Cc: Myrlene Broker, *

## 2023-01-16 NOTE — Patient Instructions (Addendum)
You have been scheduled for an endoscopy. Please follow written instructions given to you at your visit today.  If you use inhalers (even only as needed), please bring them with you on the day of your procedure.  If you take any of the following medications, they will need to be adjusted prior to your procedure:   DO NOT TAKE 7 DAYS PRIOR TO TEST- Trulicity (dulaglutide) Ozempic, Wegovy (semaglutide) Mounjaro (tirzepatide) Bydureon Bcise (exanatide extended release)  DO NOT TAKE 1 DAY PRIOR TO YOUR TEST Rybelsus (semaglutide) Adlyxin (lixisenatide) Victoza (liraglutide) Byetta (exanatide) ___________________________________________________________________________  _______________________________________________________  If your blood pressure at your visit was 140/90 or greater, please contact your primary care physician to follow up on this.  _______________________________________________________  If you are age 67 or older, your body mass index should be between 23-30. Your Body mass index is 36.92 kg/m. If this is out of the aforementioned range listed, please consider follow up with your Primary Care Provider.  If you are age 42 or younger, your body mass index should be between 19-25. Your Body mass index is 36.92 kg/m. If this is out of the aformentioned range listed, please consider follow up with your Primary Care Provider.   ________________________________________________________  The [facility] providers would like to encourage you to use [facility] to communicate with providers for non-urgent requests or questions.  Due to long hold times on the telephone, sending your provider a message by [facility] may be a faster and more efficient way to get a response.  Please allow 48 business hours for a response.  Please remember that this is for non-urgent requests.  _______________________________________________________ It was a pleasure to see you today!  Thank you for  trusting me with your gastrointestinal care!

## 2023-01-17 ENCOUNTER — Telehealth: Payer: Medicare PPO

## 2023-02-06 DIAGNOSIS — F419 Anxiety disorder, unspecified: Secondary | ICD-10-CM | POA: Diagnosis not present

## 2023-02-06 DIAGNOSIS — Z4789 Encounter for other orthopedic aftercare: Secondary | ICD-10-CM | POA: Diagnosis not present

## 2023-02-06 DIAGNOSIS — F32A Depression, unspecified: Secondary | ICD-10-CM | POA: Diagnosis not present

## 2023-02-06 DIAGNOSIS — M1712 Unilateral primary osteoarthritis, left knee: Secondary | ICD-10-CM | POA: Diagnosis not present

## 2023-02-06 DIAGNOSIS — G8918 Other acute postprocedural pain: Secondary | ICD-10-CM | POA: Diagnosis not present

## 2023-02-06 DIAGNOSIS — E785 Hyperlipidemia, unspecified: Secondary | ICD-10-CM | POA: Diagnosis not present

## 2023-02-06 DIAGNOSIS — J45909 Unspecified asthma, uncomplicated: Secondary | ICD-10-CM | POA: Diagnosis not present

## 2023-02-06 DIAGNOSIS — Z6838 Body mass index (BMI) 38.0-38.9, adult: Secondary | ICD-10-CM | POA: Diagnosis not present

## 2023-02-06 DIAGNOSIS — G4733 Obstructive sleep apnea (adult) (pediatric): Secondary | ICD-10-CM | POA: Diagnosis not present

## 2023-02-06 DIAGNOSIS — F418 Other specified anxiety disorders: Secondary | ICD-10-CM | POA: Diagnosis not present

## 2023-02-06 DIAGNOSIS — E669 Obesity, unspecified: Secondary | ICD-10-CM | POA: Diagnosis not present

## 2023-02-06 DIAGNOSIS — I10 Essential (primary) hypertension: Secondary | ICD-10-CM | POA: Diagnosis not present

## 2023-02-06 DIAGNOSIS — Z96652 Presence of left artificial knee joint: Secondary | ICD-10-CM | POA: Diagnosis not present

## 2023-02-06 DIAGNOSIS — Z96651 Presence of right artificial knee joint: Secondary | ICD-10-CM | POA: Diagnosis not present

## 2023-02-06 DIAGNOSIS — M21962 Unspecified acquired deformity of left lower leg: Secondary | ICD-10-CM | POA: Diagnosis not present

## 2023-02-07 DIAGNOSIS — F418 Other specified anxiety disorders: Secondary | ICD-10-CM | POA: Diagnosis not present

## 2023-02-07 DIAGNOSIS — E669 Obesity, unspecified: Secondary | ICD-10-CM | POA: Diagnosis not present

## 2023-02-07 DIAGNOSIS — M1712 Unilateral primary osteoarthritis, left knee: Secondary | ICD-10-CM | POA: Diagnosis not present

## 2023-02-07 DIAGNOSIS — Z96651 Presence of right artificial knee joint: Secondary | ICD-10-CM | POA: Diagnosis not present

## 2023-02-07 DIAGNOSIS — M21962 Unspecified acquired deformity of left lower leg: Secondary | ICD-10-CM | POA: Diagnosis not present

## 2023-02-07 DIAGNOSIS — I10 Essential (primary) hypertension: Secondary | ICD-10-CM | POA: Diagnosis not present

## 2023-02-07 DIAGNOSIS — Z6838 Body mass index (BMI) 38.0-38.9, adult: Secondary | ICD-10-CM | POA: Diagnosis not present

## 2023-02-07 DIAGNOSIS — E785 Hyperlipidemia, unspecified: Secondary | ICD-10-CM | POA: Diagnosis not present

## 2023-02-13 ENCOUNTER — Ambulatory Visit
Admission: RE | Admit: 2023-02-13 | Discharge: 2023-02-13 | Disposition: A | Discharge status: Home / Self Care | Payer: Medicare PPO | Source: Ambulatory Visit | Attending: Physician Assistant | Admitting: Physician Assistant

## 2023-02-13 ENCOUNTER — Other Ambulatory Visit: Payer: Self-pay | Admitting: Physician Assistant

## 2023-02-13 DIAGNOSIS — M79605 Pain in left leg: Secondary | ICD-10-CM | POA: Diagnosis not present

## 2023-02-13 DIAGNOSIS — M25662 Stiffness of left knee, not elsewhere classified: Secondary | ICD-10-CM | POA: Diagnosis not present

## 2023-02-13 DIAGNOSIS — R2240 Localized swelling, mass and lump, unspecified lower limb: Secondary | ICD-10-CM

## 2023-02-13 DIAGNOSIS — M25562 Pain in left knee: Secondary | ICD-10-CM | POA: Diagnosis not present

## 2023-02-13 DIAGNOSIS — R600 Localized edema: Secondary | ICD-10-CM | POA: Diagnosis not present

## 2023-02-21 DIAGNOSIS — M25662 Stiffness of left knee, not elsewhere classified: Secondary | ICD-10-CM | POA: Diagnosis not present

## 2023-02-21 DIAGNOSIS — M25562 Pain in left knee: Secondary | ICD-10-CM | POA: Diagnosis not present

## 2023-03-03 ENCOUNTER — Other Ambulatory Visit: Payer: Self-pay | Admitting: Internal Medicine

## 2023-03-03 DIAGNOSIS — E782 Mixed hyperlipidemia: Secondary | ICD-10-CM

## 2023-03-04 DIAGNOSIS — M25562 Pain in left knee: Secondary | ICD-10-CM | POA: Diagnosis not present

## 2023-03-04 DIAGNOSIS — M25662 Stiffness of left knee, not elsewhere classified: Secondary | ICD-10-CM | POA: Diagnosis not present

## 2023-03-06 DIAGNOSIS — M25662 Stiffness of left knee, not elsewhere classified: Secondary | ICD-10-CM | POA: Diagnosis not present

## 2023-03-06 DIAGNOSIS — M25562 Pain in left knee: Secondary | ICD-10-CM | POA: Diagnosis not present

## 2023-03-11 DIAGNOSIS — M25662 Stiffness of left knee, not elsewhere classified: Secondary | ICD-10-CM | POA: Diagnosis not present

## 2023-03-11 DIAGNOSIS — M25562 Pain in left knee: Secondary | ICD-10-CM | POA: Diagnosis not present

## 2023-03-13 ENCOUNTER — Ambulatory Visit: Payer: Medicare PPO | Admitting: Internal Medicine

## 2023-03-13 DIAGNOSIS — M25562 Pain in left knee: Secondary | ICD-10-CM | POA: Diagnosis not present

## 2023-03-13 DIAGNOSIS — M25662 Stiffness of left knee, not elsewhere classified: Secondary | ICD-10-CM | POA: Diagnosis not present

## 2023-03-14 ENCOUNTER — Other Ambulatory Visit: Payer: Self-pay | Admitting: Radiology

## 2023-03-14 ENCOUNTER — Encounter: Payer: Self-pay | Admitting: Radiology

## 2023-03-14 ENCOUNTER — Telehealth: Payer: Self-pay | Admitting: Internal Medicine

## 2023-03-14 DIAGNOSIS — E782 Mixed hyperlipidemia: Secondary | ICD-10-CM

## 2023-03-14 MED ORDER — VENLAFAXINE HCL ER 225 MG PO TB24: 225.0000 mg | ORAL_TABLET | Freq: Every day | ORAL | 3 refills | Status: DC

## 2023-03-14 MED ORDER — ATORVASTATIN CALCIUM 40 MG PO TABS
40.0000 mg | ORAL_TABLET | Freq: Every day | ORAL | 3 refills | Status: DC
Start: 2023-03-14 — End: 2023-03-24

## 2023-03-14 MED ORDER — SOLIFENACIN SUCCINATE 10 MG PO TABS: 10.0000 mg | ORAL_TABLET | Freq: Every day | ORAL | 3 refills | Status: DC

## 2023-03-14 NOTE — Telephone Encounter (Signed)
Patient would like enough medication sent in to get her to her March 24, 2023 appointment.  She has had knee surgery and has not been able to get out much.   Prescription Request  03/14/2023  LOV: Visit date not found  What is the name of the medication or equipment? Venlafaxine, atorvastatin, vesicare,   Have you contacted your pharmacy to request a refill? Yes   Which pharmacy would you like this sent to?   [facility] - [place], [place] - [address] [place] [address] Phone: [phone] Fax: [phone]    Patient notified that their request is being sent to the clinical staff for review and that they should receive a response within 2 business days.   Please advise at Mobile [phone] (mobile)

## 2023-03-18 DIAGNOSIS — M25562 Pain in left knee: Secondary | ICD-10-CM | POA: Diagnosis not present

## 2023-03-18 DIAGNOSIS — M25662 Stiffness of left knee, not elsewhere classified: Secondary | ICD-10-CM | POA: Diagnosis not present

## 2023-03-20 DIAGNOSIS — M25562 Pain in left knee: Secondary | ICD-10-CM | POA: Diagnosis not present

## 2023-03-20 DIAGNOSIS — M25662 Stiffness of left knee, not elsewhere classified: Secondary | ICD-10-CM | POA: Diagnosis not present

## 2023-03-21 ENCOUNTER — Encounter: Payer: Medicare PPO | Admitting: Gastroenterology

## 2023-03-21 DIAGNOSIS — Z96652 Presence of left artificial knee joint: Secondary | ICD-10-CM | POA: Diagnosis not present

## 2023-03-24 ENCOUNTER — Ambulatory Visit: Payer: Medicare PPO | Admitting: Internal Medicine

## 2023-03-24 ENCOUNTER — Encounter: Payer: Self-pay | Admitting: Internal Medicine

## 2023-03-24 VITALS — BP 138/84 | HR 100 | Temp 98.5°F | Ht 61.0 in | Wt 197.0 lb

## 2023-03-24 DIAGNOSIS — Q796 Ehlers-Danlos syndrome, unspecified: Secondary | ICD-10-CM | POA: Diagnosis not present

## 2023-03-24 DIAGNOSIS — N3941 Urge incontinence: Secondary | ICD-10-CM | POA: Diagnosis not present

## 2023-03-24 DIAGNOSIS — I10 Essential (primary) hypertension: Secondary | ICD-10-CM

## 2023-03-24 DIAGNOSIS — Z23 Encounter for immunization: Secondary | ICD-10-CM | POA: Diagnosis not present

## 2023-03-24 DIAGNOSIS — Z0000 Encounter for general adult medical examination without abnormal findings: Secondary | ICD-10-CM | POA: Diagnosis not present

## 2023-03-24 DIAGNOSIS — Z8709 Personal history of other diseases of the respiratory system: Secondary | ICD-10-CM

## 2023-03-24 DIAGNOSIS — E213 Hyperparathyroidism, unspecified: Secondary | ICD-10-CM | POA: Diagnosis not present

## 2023-03-24 DIAGNOSIS — J4520 Mild intermittent asthma, uncomplicated: Secondary | ICD-10-CM

## 2023-03-24 DIAGNOSIS — E782 Mixed hyperlipidemia: Secondary | ICD-10-CM

## 2023-03-24 MED ORDER — ATORVASTATIN CALCIUM 40 MG PO TABS
40.0000 mg | ORAL_TABLET | Freq: Every day | ORAL | 3 refills | Status: DC
Start: 2023-03-24 — End: 2024-03-29

## 2023-03-24 MED ORDER — VENLAFAXINE HCL ER 225 MG PO TB24: 225.0000 mg | ORAL_TABLET | Freq: Every day | ORAL | 3 refills | Status: DC

## 2023-03-24 MED ORDER — SOLIFENACIN SUCCINATE 10 MG PO TABS: 10.0000 mg | ORAL_TABLET | Freq: Every day | ORAL | 3 refills | Status: DC

## 2023-03-24 MED ORDER — AMLODIPINE BESYLATE 10 MG PO TABS: 10.0000 mg | ORAL_TABLET | Freq: Every day | ORAL | 3 refills | Status: DC

## 2023-03-24 MED ORDER — ALBUTEROL SULFATE HFA 108 (90 BASE) MCG/ACT IN AERS
2.0000 | INHALATION_SPRAY | Freq: Four times a day (QID) | RESPIRATORY_TRACT | 3 refills | Status: DC | PRN
Start: 2023-03-24 — End: 2023-11-25

## 2023-03-24 NOTE — Assessment & Plan Note (Signed)
No flare today, mild intermittent and uses albuterol prn which is refilled.

## 2023-03-24 NOTE — Assessment & Plan Note (Signed)
Recent labs stable and will continue lipitor 40 mg daily.

## 2023-03-24 NOTE — Progress Notes (Signed)
Subjective:   Patient ID: [name] [name], female    DOB: 1956/03/07, 67 y.o.   MRN: [id number]  HPI Here for medicare wellness and physical, no new complaints. Please see A/P for status and treatment of chronic medical problems.   Diet: heart healthy Physical activity: sedentary Depression/mood screen: negative Hearing: intact to whispered voice Visual acuity: grossly normal, performs annual eye exam  ADLs: capable Fall risk: none Home safety: good Cognitive evaluation: intact to orientation, naming, recall and repetition EOL planning: adv directives discussed  Flowsheet Row Office Visit from 03/24/2023 in [facility] [place]  PHQ-2 Total Score 0       Flowsheet Row Office Visit from 03/24/2023 in [facility] [place]  PHQ-9 Total Score 0         11/20/2020   10:04 AM 11/27/2021    3:29 PM 03/05/2022    1:06 PM 08/20/2022   11:18 AM 03/24/2023    8:46 AM  Fall Risk  Falls in the past year? 1 0 0 1 0  Was there an injury with Fall? 1 0 0 1 0  Fall Risk Category Calculator 2 0 0 2 0  Fall Risk Category (Retired) Moderate Low Low    (RETIRED) Patient Fall Risk Level  Low fall risk     Patient at Risk for Falls Due to  No Fall Risks     Fall risk Follow up  Falls evaluation completed  Falls evaluation completed Falls evaluation completed   I have personally reviewed and have noted 1. The patient's medical and social history - reviewed today no changes 2. Their use of alcohol, tobacco or illicit drugs 3. Their current medications and supplements 4. The patient's functional ability including ADL's, fall risks, home safety risks and hearing or visual impairment. 5. Diet and physical activities 6. Evidence for depression or mood disorders 7. Care team reviewed and updated 8.  The patient is not on an opioid pain medication.  Patient Care Team: Myrlene Broker, MD as PCP - General (Internal Medicine) Parke Poisson, MD as PCP - Cardiology (Cardiology) Elwin Mocha, MD as Consulting Physician (Ophthalmology) Glendale Chard, DO as Consulting Physician (Neurology) Past Medical History:  Diagnosis Date   Allergy    seasonal   Anxiety    Arthritis    Asthma    Complication of anesthesia    headache after neck surgery   Ehlers-Danlos syndrome type III    Fatty liver    Fibromyalgia    GERD (gastroesophageal reflux disease)    history of   Heart murmur    History of blood in urine    History of bronchitis    History of cholelithiasis    History of kidney stones    Hyperlipemia    Hyperparathyroidism (HCC)    Hypertension    no medication needed since bariatric surgery   Insomnia    Leg pain    Low back pain    Obese    history of   Pneumonia    history of    PONV (postoperative nausea and vomiting)    Pre-diabetes    no since weight loss   Sleep apnea    improved since weight loss   Past Surgical History:  Procedure Laterality Date   achilles tendon tibial tendon fusion     4 surgeries   BREAST BIOPSY Right    BREAST BIOPSY Left    BREAST BIOPSY Left  CERVICAL DISCECTOMY     2010   CESAREAN SECTION     CHOLECYSTECTOMY     COLONOSCOPY     CYSTOSCOPY WITH RETROGRADE PYELOGRAM, URETEROSCOPY AND STENT PLACEMENT Right 08/01/2017   Procedure: CYSTOSCOPY WITH RETROGRADE PYELOGRAM, URETEROSCOPY AND STENT PLACEMENT;  Surgeon: Sebastian Ache, MD;  Location: [facility];  Service: Urology;  Laterality: Right;   HOLMIUM LASER APPLICATION Right 08/01/2017   Procedure: HOLMIUM LASER APPLICATION;  Surgeon: Sebastian Ache, MD;  Location: [facility];  Service: Urology;  Laterality: Right;   JOINT REPLACEMENT Right    partial joint replacement knee   LAPAROSCOPIC APPENDECTOMY N/A 10/20/2022   Procedure: APPENDECTOMY LAPAROSCOPIC;  Surgeon: Kinsinger, De Blanch, MD;  Location: [facility];  Service: General;  Laterality: N/A;   LAPAROSCOPIC GASTRIC  RESTRICTIVE DUODENAL PROCEDURE (DUODENAL SWITCH)     2017   lower back     2008   spinal injections     SPINE SURGERY     L4-L5, C6-C7    Family History  Problem Relation Age of Onset   Parkinson's disease Mother        Deceased, 68   Heart disease Father        Deceased, 47   Pancreatic cancer Sister        Survivor   Healthy Daughter    Colon cancer Neg Hx    Colon polyps Neg Hx    Esophageal cancer Neg Hx    Rectal cancer Neg Hx    Stomach cancer Neg Hx    Breast cancer Neg Hx    Review of Systems  Constitutional: Negative.   HENT: Negative.    Eyes: Negative.   Respiratory:  Negative for cough, chest tightness and shortness of breath.   Cardiovascular:  Negative for chest pain, palpitations and leg swelling.  Gastrointestinal:  Negative for abdominal distention, abdominal pain, constipation, diarrhea, nausea and vomiting.  Musculoskeletal:  Positive for arthralgias.  Skin: Negative.   Neurological: Negative.   Psychiatric/Behavioral: Negative.      Objective:  Physical Exam Constitutional:      Appearance: She is well-developed. She is obese.  HENT:     Head: Normocephalic and atraumatic.  Cardiovascular:     Rate and Rhythm: Normal rate and regular rhythm.  Pulmonary:     Effort: Pulmonary effort is normal. No respiratory distress.     Breath sounds: Normal breath sounds. No wheezing or rales.  Abdominal:     General: Bowel sounds are normal. There is no distension.     Palpations: Abdomen is soft.     Tenderness: There is no abdominal tenderness. There is no rebound.  Musculoskeletal:     Cervical back: Normal range of motion.  Skin:    General: Skin is warm and dry.  Neurological:     Mental Status: She is alert and oriented to person, place, and time.     Coordination: Coordination normal.     Vitals:   03/24/23 0844  BP: 138/84  Pulse: 100  Temp: 98.5 F (36.9 C)  TempSrc: Oral  SpO2: 94%  Weight: 197 lb (89.4 kg)  Height: 5\' 1"  (1.549  m)    Assessment & Plan:  Flu shot

## 2023-03-24 NOTE — Patient Instructions (Signed)
We have given you the flu shot.   

## 2023-03-24 NOTE — Assessment & Plan Note (Signed)
BP at goal and recent labs stable. Continue amlodipine 10 mg daily.

## 2023-03-24 NOTE — Assessment & Plan Note (Signed)
Overall stable and is cautious about injuries and falls.

## 2023-03-24 NOTE — Assessment & Plan Note (Signed)
Using vesicare 10 mg daily and worsening symptoms after 2 surgeries this year. Starting to improve gradually and she will let us know if change is needed.

## 2023-03-24 NOTE — Assessment & Plan Note (Signed)
Flu shot given. Covid-19 counseled. Pneumonia complete. Shingrix complete. Tetanus due at pharmacy. Colonoscopy due 2030. Mammogram due 2025, pap smear aged out and dexa due 2025. Counseled about sun safety and mole surveillance. Counseled about the dangers of distracted driving. Given 10 year screening recommendations.

## 2023-03-24 NOTE — Assessment & Plan Note (Signed)
Recent labs stable, needs DEXA every 3 years.

## 2023-03-24 NOTE — Assessment & Plan Note (Signed)
BMI 37 with hypertension and hyperlipidemia. Will be starting wegovy soon as this was approved.

## 2023-03-25 DIAGNOSIS — M25562 Pain in left knee: Secondary | ICD-10-CM | POA: Diagnosis not present

## 2023-03-25 DIAGNOSIS — M25662 Stiffness of left knee, not elsewhere classified: Secondary | ICD-10-CM | POA: Diagnosis not present

## 2023-03-28 DIAGNOSIS — M25662 Stiffness of left knee, not elsewhere classified: Secondary | ICD-10-CM | POA: Diagnosis not present

## 2023-03-28 DIAGNOSIS — M25562 Pain in left knee: Secondary | ICD-10-CM | POA: Diagnosis not present

## 2023-03-31 DIAGNOSIS — M25562 Pain in left knee: Secondary | ICD-10-CM | POA: Diagnosis not present

## 2023-03-31 DIAGNOSIS — M25662 Stiffness of left knee, not elsewhere classified: Secondary | ICD-10-CM | POA: Diagnosis not present

## 2023-04-15 DIAGNOSIS — M25562 Pain in left knee: Secondary | ICD-10-CM | POA: Diagnosis not present

## 2023-04-15 DIAGNOSIS — M25662 Stiffness of left knee, not elsewhere classified: Secondary | ICD-10-CM | POA: Diagnosis not present

## 2023-04-17 DIAGNOSIS — M25662 Stiffness of left knee, not elsewhere classified: Secondary | ICD-10-CM | POA: Diagnosis not present

## 2023-04-17 DIAGNOSIS — M25562 Pain in left knee: Secondary | ICD-10-CM | POA: Diagnosis not present

## 2023-04-21 DIAGNOSIS — M25562 Pain in left knee: Secondary | ICD-10-CM | POA: Diagnosis not present

## 2023-04-21 DIAGNOSIS — M25662 Stiffness of left knee, not elsewhere classified: Secondary | ICD-10-CM | POA: Diagnosis not present

## 2023-04-24 DIAGNOSIS — M25562 Pain in left knee: Secondary | ICD-10-CM | POA: Diagnosis not present

## 2023-04-24 DIAGNOSIS — M25662 Stiffness of left knee, not elsewhere classified: Secondary | ICD-10-CM | POA: Diagnosis not present

## 2023-04-28 DIAGNOSIS — M25662 Stiffness of left knee, not elsewhere classified: Secondary | ICD-10-CM | POA: Diagnosis not present

## 2023-04-28 DIAGNOSIS — M25562 Pain in left knee: Secondary | ICD-10-CM | POA: Diagnosis not present

## 2023-05-01 ENCOUNTER — Ambulatory Visit: Payer: Medicare PPO | Admitting: Gastroenterology

## 2023-05-01 ENCOUNTER — Encounter: Payer: Self-pay | Admitting: Gastroenterology

## 2023-05-01 VITALS — BP 114/64 | HR 85 | Temp 97.1°F | Resp 16 | Ht 61.0 in | Wt 195.0 lb

## 2023-05-01 DIAGNOSIS — K319 Disease of stomach and duodenum, unspecified: Secondary | ICD-10-CM | POA: Diagnosis not present

## 2023-05-01 DIAGNOSIS — E669 Obesity, unspecified: Secondary | ICD-10-CM | POA: Diagnosis not present

## 2023-05-01 DIAGNOSIS — K317 Polyp of stomach and duodenum: Secondary | ICD-10-CM | POA: Diagnosis not present

## 2023-05-01 DIAGNOSIS — K3189 Other diseases of stomach and duodenum: Secondary | ICD-10-CM | POA: Diagnosis not present

## 2023-05-01 DIAGNOSIS — K222 Esophageal obstruction: Secondary | ICD-10-CM | POA: Diagnosis not present

## 2023-05-01 DIAGNOSIS — R1310 Dysphagia, unspecified: Secondary | ICD-10-CM | POA: Diagnosis not present

## 2023-05-01 DIAGNOSIS — M797 Fibromyalgia: Secondary | ICD-10-CM | POA: Diagnosis not present

## 2023-05-01 DIAGNOSIS — K2970 Gastritis, unspecified, without bleeding: Secondary | ICD-10-CM | POA: Diagnosis not present

## 2023-05-01 DIAGNOSIS — G4730 Sleep apnea, unspecified: Secondary | ICD-10-CM | POA: Diagnosis not present

## 2023-05-01 DIAGNOSIS — F419 Anxiety disorder, unspecified: Secondary | ICD-10-CM | POA: Diagnosis not present

## 2023-05-01 MED ORDER — SODIUM CHLORIDE 0.9 % IV SOLN: 500.0000 mL | Freq: Once | INTRAVENOUS | Status: DC

## 2023-05-01 NOTE — Progress Notes (Signed)
Sedate, gd SR, tolerated procedure well, VSS, report to RN 

## 2023-05-01 NOTE — Op Note (Signed)
[facility] Patient Name: [name] [name] Procedure Date: 05/01/2023 10:23 AM MRN: [id number] Endoscopist: Napoleon Form , MD, [id number] Age: 67 Referring MD:  Date of Birth: [birth year] Gender: Female Account #: [id number] Procedure:                Upper GI endoscopy Indications:              Dysphagia Medicines:                Monitored Anesthesia Care Procedure:                Pre-Anesthesia Assessment:                           - Prior to the procedure, a History and Physical                            was performed, and patient medications and                            allergies were reviewed. The patient's tolerance of                            previous anesthesia was also reviewed. The risks                            and benefits of the procedure and the sedation                            options and risks were discussed with the patient.                            All questions were answered, and informed consent                            was obtained. Prior Anticoagulants: The patient has                            taken no anticoagulant or antiplatelet agents. ASA                            Grade Assessment: II - A patient with mild systemic                            disease. After reviewing the risks and benefits,                            the patient was deemed in satisfactory condition to                            undergo the procedure.                           After obtaining informed consent, the endoscope was  passed under direct vision. Throughout the                            procedure, the patient's blood pressure, pulse, and                            oxygen saturations were monitored continuously. The                            GIF [id number] #[id number] was introduced through the                            mouth, and advanced to the afferent and efferent                            jejunal loops. The upper GI endoscopy was                             accomplished without difficulty. The patient                            tolerated the procedure well. Scope In: Scope Out: Findings:                 The lower third of the esophagus was moderately                            tortuous.                           One benign-appearing, intrinsic mild stenosis was                            found 30 to 31 cm from the incisors. This stenosis                            measured less than one cm (in length). The stenosis                            was traversed. A TTS dilator was passed through the                            scope. Dilation with an 18-19-20 mm x 8 cm CRE                            balloon dilator was performed to 20 mm. The                            dilation site was examined following endoscope                            reinsertion and showed mild mucosal disruption.  Normal mucosa was found in the entire esophagus.                            Biopsies were obtained from the proximal and distal                            esophagus with cold forceps for histology of                            suspected eosinophilic esophagitis.                           Evidence of a sleeve gastrectomy and duodenal                            switch was found in the anastomosis. This was                            characterized by congestion, erosion and erythema.                            Biopsies were taken with a cold forceps for                            Helicobacter pylori testing.                           A few 5 to 15 mm sessile polyps with no bleeding                            were found in the duodenal bulb. Biopsies were                            taken with a cold forceps for histology.                           There was evidence of a previous surgical                            anastomosis with efferent and afferent jejunal                            loops, visible staple.  Biopsies for histology were                            taken with a cold forceps for evaluation of celiac                            disease. Complications:            No immediate complications. Estimated Blood Loss:     Estimated blood loss was minimal. Impression:               - Tortuous esophagus.                           -  Benign-appearing esophageal stenosis. Dilated.                           - Normal mucosa was found in the entire esophagus.                            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                            Biopsied.                           - A few duodenal polyps. Biopsied.                           - Previous surgical anastomosis, characterized by                            healthy appearing mucosa was found in the jejunum.                            Biopsied.                           - Biopsies were taken with a cold forceps for                            evaluation of eosinophilic esophagitis. Napoleon Form, MD 05/01/2023 10:57:05 AM This report has been signed electronically.

## 2023-05-01 NOTE — Progress Notes (Signed)
Called to room to assist during endoscopic procedure.  Patient ID and intended procedure confirmed with present staff. Received instructions for my participation in the procedure from the performing physician.  

## 2023-05-01 NOTE — Patient Instructions (Addendum)
-   Resume previous diet on (05/02/2023) - Continue present medications. - Await pathology results - Post dilation diet for 24 hours    YOU HAD AN ENDOSCOPIC PROCEDURE TODAY AT THE [facility]:   Refer to the procedure report that was given to you for any specific questions about what was found during the examination.  If the procedure report does not answer your questions, please call your gastroenterologist to clarify.  If you requested that your care partner not be given the details of your procedure findings, then the procedure report has been included in a sealed envelope for you to review at your convenience later.  YOU SHOULD EXPECT: Some feelings of bloating in the abdomen. Passage of more gas than usual.  Walking can help get rid of the air that was put into your GI tract during the procedure and reduce the bloating. If you had a lower endoscopy (such as a colonoscopy or flexible sigmoidoscopy) you may notice spotting of blood in your stool or on the toilet paper. If you underwent a bowel prep for your procedure, you may not have a normal bowel movement for a few days.  Please Note:  You might notice some irritation and congestion in your nose or some drainage.  This is from the oxygen used during your procedure.  There is no need for concern and it should clear up in a day or so.  SYMPTOMS TO REPORT IMMEDIATELY: Following upper endoscopy (EGD)  Vomiting of blood or coffee ground material  New chest pain or pain under the shoulder blades  Painful or persistently difficult swallowing  New shortness of breath  Fever of 100F or higher  Black, tarry-looking stools  For urgent or emergent issues, a gastroenterologist can be reached at any hour by calling [phone]. Do not use MyChart messaging for urgent concerns.    DIET:  We do recommend a small meal at first, but then you may proceed to your regular diet.  Drink plenty of fluids but you should avoid alcoholic  beverages for 24 hours.  ACTIVITY:  You should plan to take it easy for the rest of today and you should NOT DRIVE or use heavy machinery until tomorrow (because of the sedation medicines used during the test).    FOLLOW UP: Our staff will call the number listed on your records the next business day following your procedure.  We will call around 7:15- 8:00 am to check on you and address any questions or concerns that you may have regarding the information given to you following your procedure. If we do not reach you, we will leave a message.     If any biopsies were taken you will be contacted by phone or by letter within the next 1-3 weeks.  Please call us at [phone] if you have not heard about the biopsies in 3 weeks.    SIGNATURES/CONFIDENTIALITY: You and/or your care partner have signed paperwork which will be entered into your electronic medical record.  These signatures attest to the fact that that the information above on your After Visit Summary has been reviewed and is understood.  Full responsibility of the confidentiality of this discharge information lies with you and/or your care-partner.

## 2023-05-01 NOTE — Progress Notes (Signed)
[facility] History and Physical   Primary Care Physician:  Myrlene Broker, MD   Reason for Procedure:  Dysphagia  Plan:    EGD with possible interventions as needed     HPI: [name] [name] is a very pleasant 67 y.o. female here for EGD for evaluation and management of dysphagia. Esophageal biopsies and esophageal dilation as needed   The risks and benefits as well as alternatives of endoscopic procedure(s) have been discussed and reviewed. All questions answered. The patient agrees to proceed.    Past Medical History:  Diagnosis Date   Allergy    seasonal   Anxiety    Arthritis    Asthma    Cataract    Complication of anesthesia    headache after neck surgery   Ehlers-Danlos syndrome type III    Fatty liver    Fibromyalgia    GERD (gastroesophageal reflux disease)    history of   Heart murmur    History of blood in urine    History of bronchitis    History of cholelithiasis    History of kidney stones    Hyperlipemia    Hyperparathyroidism (HCC)    Hypertension    no medication needed since bariatric surgery   Insomnia    Leg pain    Low back pain    Obese    history of   Osteoporosis    Pneumonia    history of    PONV (postoperative nausea and vomiting)    Pre-diabetes    no since weight loss   Sleep apnea    improved since weight loss    Past Surgical History:  Procedure Laterality Date   achilles tendon tibial tendon fusion     4 surgeries   APPENDECTOMY     BREAST BIOPSY Right    BREAST BIOPSY Left    BREAST BIOPSY Left    CERVICAL DISCECTOMY     2010   CESAREAN SECTION     CHOLECYSTECTOMY     COLONOSCOPY     CYSTOSCOPY WITH RETROGRADE PYELOGRAM, URETEROSCOPY AND STENT PLACEMENT Right 08/01/2017   Procedure: CYSTOSCOPY WITH RETROGRADE PYELOGRAM, URETEROSCOPY AND STENT PLACEMENT;  Surgeon: Sebastian Ache, MD;  Location: [facility];  Service: Urology;  Laterality: Right;   GASTROPLASTY DUODENAL SWITCH   2017   HOLMIUM LASER APPLICATION Right 08/01/2017   Procedure: HOLMIUM LASER APPLICATION;  Surgeon: Sebastian Ache, MD;  Location: [facility];  Service: Urology;  Laterality: Right;   JOINT REPLACEMENT Right    partial joint replacement knee   LAPAROSCOPIC APPENDECTOMY N/A 10/20/2022   Procedure: APPENDECTOMY LAPAROSCOPIC;  Surgeon: Kinsinger, De Blanch, MD;  Location: [facility] OR;  Service: General;  Laterality: N/A;   LAPAROSCOPIC GASTRIC RESTRICTIVE DUODENAL PROCEDURE (DUODENAL SWITCH)     2017   lower back     2008   spinal injections     SPINE SURGERY     L4-L5, C6-C7    UPPER GASTROINTESTINAL ENDOSCOPY      Prior to Admission medications   Medication Sig Start Date End Date Taking? Authorizing Provider  acetaminophen (TYLENOL) 500 MG tablet Take 2 tablets (1,000 mg total) by mouth every 8 (eight) hours as needed. 10/21/22  Yes Maczis, Elmer Sow, PA-C  ALPRAZolam (XANAX) 0.5 MG tablet TAKE 1 TABLET BY MOUTH 2 TIMES DAILY AS NEEDED FOR ANXIETY. 09/23/22  Yes Myrlene Broker, MD  amLODipine (NORVASC) 10 MG tablet Take 1 tablet (10 mg total) by mouth daily. 03/24/23  Yes Myrlene Broker, MD  ASPIRIN LOW DOSE 81 MG tablet Take 81 mg by mouth 2 (two) times daily. 01/28/23  Yes [provider]  atorvastatin (LIPITOR) 40 MG tablet Take 1 tablet (40 mg total) by mouth daily. 03/24/23  Yes Myrlene Broker, MD  docusate sodium (COLACE) 100 MG capsule Take 100 mg by mouth 2 (two) times daily. 01/28/23  Yes [provider]  fexofenadine (ALLEGRA) 180 MG tablet Take 180 mg by mouth daily as needed for allergies.   Yes [provider]  gabapentin (NEURONTIN) 300 MG capsule Take 300 mg by mouth at bedtime. 04/21/23  Yes [provider]  HYDROcodone-acetaminophen (NORCO/VICODIN) 5-325 MG tablet Take 5-325 tablets by mouth every 6 (six) hours as needed. 04/21/23  Yes [provider]  Multiple Vitamins-Minerals (BARIATRIC  MULTIVITAMINS/IRON PO) Take 3 tablets by mouth daily.    Yes [provider]  solifenacin (VESICARE) 10 MG tablet Take 1 tablet (10 mg total) by mouth daily. 03/24/23  Yes Myrlene Broker, MD  Venlafaxine HCl 225 MG TB24 Take 1 tablet (225 mg total) by mouth daily. 03/24/23  Yes Myrlene Broker, MD  albuterol (VENTOLIN HFA) 108 (90 Base) MCG/ACT inhaler Inhale 2 puffs into the lungs every 6 (six) hours as needed for wheezing. 03/24/23   Myrlene Broker, MD  cyclobenzaprine (FLEXERIL) 5 MG tablet Take 5 mg by mouth 3 (three) times daily as needed for muscle spasms. 10/07/22   [provider]  nitroGLYCERIN (NITROSTAT) 0.4 MG SL tablet Place 1 tablet (0.4 mg total) under the tongue every 5 (five) minutes as needed for chest pain. 12/12/22 03/12/23  Parke Poisson, MD  ondansetron (ZOFRAN-ODT) 4 MG disintegrating tablet Take 1 tablet (4 mg total) by mouth every 8 (eight) hours as needed for nausea or vomiting. 12/29/22   Tegeler, Canary Brim, MD    Current Outpatient Medications  Medication Sig Dispense Refill   acetaminophen (TYLENOL) 500 MG tablet Take 2 tablets (1,000 mg total) by mouth every 8 (eight) hours as needed. 30 tablet 0   ALPRAZolam (XANAX) 0.5 MG tablet TAKE 1 TABLET BY MOUTH 2 TIMES DAILY AS NEEDED FOR ANXIETY. 60 tablet 5   amLODipine (NORVASC) 10 MG tablet Take 1 tablet (10 mg total) by mouth daily. 90 tablet 3   ASPIRIN LOW DOSE 81 MG tablet Take 81 mg by mouth 2 (two) times daily.     atorvastatin (LIPITOR) 40 MG tablet Take 1 tablet (40 mg total) by mouth daily. 90 tablet 3   docusate sodium (COLACE) 100 MG capsule Take 100 mg by mouth 2 (two) times daily.     fexofenadine (ALLEGRA) 180 MG tablet Take 180 mg by mouth daily as needed for allergies.     gabapentin (NEURONTIN) 300 MG capsule Take 300 mg by mouth at bedtime.     HYDROcodone-acetaminophen (NORCO/VICODIN) 5-325 MG tablet Take 5-325 tablets by mouth every 6 (six) hours as needed.      Multiple Vitamins-Minerals (BARIATRIC MULTIVITAMINS/IRON PO) Take 3 tablets by mouth daily.      solifenacin (VESICARE) 10 MG tablet Take 1 tablet (10 mg total) by mouth daily. 90 tablet 3   Venlafaxine HCl 225 MG TB24 Take 1 tablet (225 mg total) by mouth daily. 90 tablet 3   albuterol (VENTOLIN HFA) 108 (90 Base) MCG/ACT inhaler Inhale 2 puffs into the lungs every 6 (six) hours as needed for wheezing. 6.7 g 3   cyclobenzaprine (FLEXERIL) 5 MG tablet Take 5 mg by  mouth 3 (three) times daily as needed for muscle spasms.     nitroGLYCERIN (NITROSTAT) 0.4 MG SL tablet Place 1 tablet (0.4 mg total) under the tongue every 5 (five) minutes as needed for chest pain. 25 tablet 2   ondansetron (ZOFRAN-ODT) 4 MG disintegrating tablet Take 1 tablet (4 mg total) by mouth every 8 (eight) hours as needed for nausea or vomiting. 20 tablet 0   Current Facility-Administered Medications  Medication Dose Route Frequency Provider Last Rate Last Admin   0.9 %  sodium chloride infusion  500 mL Intravenous Once Napoleon Form, MD        Allergies as of 05/01/2023   (No Known Allergies)    Family History  Problem Relation Age of Onset   Parkinson's disease Mother        Deceased, 59   Heart disease Father        Deceased, 68   Pancreatic cancer Sister        Survivor   Healthy Daughter    Colon cancer Neg Hx    Colon polyps Neg Hx    Esophageal cancer Neg Hx    Rectal cancer Neg Hx    Stomach cancer Neg Hx    Breast cancer Neg Hx     Social History   Socioeconomic History   Marital status: Married    Spouse name: Not on file   Number of children: 1   Years of education: Not on file   Highest education level: Not on file  Occupational History   Occupation: Retired  Tobacco Use   Smoking status: Never   Smokeless tobacco: Never  Vaping Use   Vaping status: Never Used  Substance and Sexual Activity   Alcohol use: Yes    Alcohol/week: 0.0 standard drinks of alcohol    Comment: rare    Drug use: No   Sexual activity: Not on file  Other Topics Concern   Not on file  Social History Narrative   Lives with husband in a one story home.  Has 1 daughter.     On disability in 2009.  Used to work as an Magazine features editor at [employer]      Social Determinants of Health   Financial Resource Strain: Low Risk  (11/27/2021)   Overall Financial Resource Strain (CARDIA)    Difficulty of Paying Living Expenses: Not hard at all  Food Insecurity: No Food Insecurity (10/19/2022)   Hunger Vital Sign    Worried About Running Out of Food in the Last Year: Never true    Ran Out of Food in the Last Year: Never true  Transportation Needs: No Transportation Needs (10/19/2022)   PRAPARE - Administrator, Civil Service (Medical): No    Lack of Transportation (Non-Medical): No  Physical Activity: Sufficiently Active (11/27/2021)   Exercise Vital Sign    Days of Exercise per Week: 5 days    Minutes of Exercise per Session: 30 min  Stress: No Stress Concern Present (11/27/2021)   [employer] of Occupational Health - Occupational Stress Questionnaire    Feeling of Stress : Not at all  Social Connections: Moderately Isolated (11/27/2021)   Social Connection and Isolation Panel [NHANES]    Frequency of Communication with Friends and Family: More than three times a week    Frequency of Social Gatherings with Friends and Family: More than three times a week    Attends Religious Services: Never    Database administrator or Organizations: No  Attends Banker Meetings: Never    Marital Status: Married  Catering manager Violence: Not At Risk (10/19/2022)   Humiliation, Afraid, Rape, and Kick questionnaire    Fear of Current or Ex-Partner: No    Emotionally Abused: No    Physically Abused: No    Sexually Abused: No    Review of Systems:  All other review of systems negative except as mentioned in the HPI.  Physical Exam: Vital signs in last 24 hours: BP 133/77   Pulse 86    Temp (!) 97.1 F (36.2 C)   Ht 5\' 1"  (1.549 m)   Wt 195 lb (88.5 kg)   SpO2 94%   BMI 36.84 kg/m  General:   Alert, NAD Lungs:  Clear .   Heart:  Regular rate and rhythm Abdomen:  Soft, nontender and nondistended. Neuro/Psych:  Alert and cooperative. Normal mood and affect. A and O x 3  Reviewed labs, radiology imaging, old records and pertinent past GI work up  Patient is appropriate for planned procedure(s) and anesthesia in an ambulatory setting   K. Scherry Ran , MD [phone]

## 2023-05-02 ENCOUNTER — Telehealth: Payer: Self-pay

## 2023-05-02 NOTE — Telephone Encounter (Signed)
Left message on follow up call. 

## 2023-05-05 DIAGNOSIS — M25662 Stiffness of left knee, not elsewhere classified: Secondary | ICD-10-CM | POA: Diagnosis not present

## 2023-05-05 DIAGNOSIS — M25562 Pain in left knee: Secondary | ICD-10-CM | POA: Diagnosis not present

## 2023-05-05 LAB — SURGICAL PATHOLOGY

## 2023-05-09 DIAGNOSIS — M25562 Pain in left knee: Secondary | ICD-10-CM | POA: Diagnosis not present

## 2023-05-09 DIAGNOSIS — M25662 Stiffness of left knee, not elsewhere classified: Secondary | ICD-10-CM | POA: Diagnosis not present

## 2023-05-13 ENCOUNTER — Telehealth: Payer: Self-pay | Admitting: Internal Medicine

## 2023-05-13 NOTE — Telephone Encounter (Signed)
Prescription Request  05/13/2023  LOV: 03/24/2023  What is the name of the medication or equipment?  ALPRAZolam (XANAX) 0.5 MG tablet   Have you contacted your pharmacy to request a refill? No   Which pharmacy would you like this sent to?    [id number]en, [place] - [address] [place] [address] Phone: [phone] Fax: [phone]   Patient notified that their request is being sent to the clinical staff for review and that they should receive a response within 2 business days.   Please advise at Mobile [phone] (mobile)

## 2023-05-14 MED ORDER — ALPRAZOLAM 0.5 MG PO TABS: 0.5000 mg | ORAL_TABLET | Freq: Two times a day (BID) | ORAL | 1 refills | Status: DC | PRN

## 2023-05-14 NOTE — Telephone Encounter (Signed)
Called and LVM. 1st attempt.

## 2023-05-14 NOTE — Telephone Encounter (Signed)
We have refilled but noticed that since we refilled this last time in April that she is now filling hydrocodone monthly. It is not indicated to be on chronic opioids (hydrocodone) and benzodiazepines (xanax) due to higher risk of accidental overdose. Is she going to be on this long term? If so she needs a visit with [place] within 1-2 months to discuss other options for anxiety.

## 2023-05-14 NOTE — Telephone Encounter (Signed)
Pharmacy is on file please refill for patient

## 2023-05-14 NOTE — Telephone Encounter (Signed)
Patient returned Micaiah's call and would like a call back at [phone].

## 2023-05-20 DIAGNOSIS — M25562 Pain in left knee: Secondary | ICD-10-CM | POA: Diagnosis not present

## 2023-05-20 DIAGNOSIS — M25662 Stiffness of left knee, not elsewhere classified: Secondary | ICD-10-CM | POA: Diagnosis not present

## 2023-05-20 NOTE — Telephone Encounter (Signed)
Patient states that she is no longer taking the hydrocodone it was just for the knee pain from knee replacement and the xanax is for sleeping.

## 2023-05-22 DIAGNOSIS — M25562 Pain in left knee: Secondary | ICD-10-CM | POA: Diagnosis not present

## 2023-05-22 DIAGNOSIS — M25662 Stiffness of left knee, not elsewhere classified: Secondary | ICD-10-CM | POA: Diagnosis not present

## 2023-05-28 ENCOUNTER — Encounter: Payer: Self-pay | Admitting: Gastroenterology

## 2023-05-28 DIAGNOSIS — M25662 Stiffness of left knee, not elsewhere classified: Secondary | ICD-10-CM | POA: Diagnosis not present

## 2023-05-28 DIAGNOSIS — M25562 Pain in left knee: Secondary | ICD-10-CM | POA: Diagnosis not present

## 2023-06-03 DIAGNOSIS — M25662 Stiffness of left knee, not elsewhere classified: Secondary | ICD-10-CM | POA: Diagnosis not present

## 2023-06-03 DIAGNOSIS — M25562 Pain in left knee: Secondary | ICD-10-CM | POA: Diagnosis not present

## 2023-06-05 DIAGNOSIS — M25562 Pain in left knee: Secondary | ICD-10-CM | POA: Diagnosis not present

## 2023-06-05 DIAGNOSIS — M25662 Stiffness of left knee, not elsewhere classified: Secondary | ICD-10-CM | POA: Diagnosis not present

## 2023-07-28 DIAGNOSIS — Z96652 Presence of left artificial knee joint: Secondary | ICD-10-CM | POA: Diagnosis not present

## 2023-08-05 ENCOUNTER — Other Ambulatory Visit: Payer: Self-pay | Admitting: Internal Medicine

## 2023-08-29 ENCOUNTER — Ambulatory Visit: Admitting: Family

## 2023-08-29 ENCOUNTER — Encounter: Payer: Self-pay | Admitting: Family

## 2023-08-29 ENCOUNTER — Ambulatory Visit: Payer: Self-pay | Admitting: Internal Medicine

## 2023-08-29 VITALS — BP 136/74 | HR 99 | Temp 98.8°F | Ht 61.0 in | Wt 196.6 lb

## 2023-08-29 DIAGNOSIS — J101 Influenza due to other identified influenza virus with other respiratory manifestations: Secondary | ICD-10-CM | POA: Diagnosis not present

## 2023-08-29 DIAGNOSIS — R051 Acute cough: Secondary | ICD-10-CM

## 2023-08-29 LAB — POCT RESPIRATORY SYNCYTIAL VIRUS: RSV Rapid Ag: NEGATIVE

## 2023-08-29 LAB — POCT INFLUENZA A/B
Influenza A, POC: POSITIVE — AB
Influenza B, POC: NEGATIVE

## 2023-08-29 LAB — POC COVID19 BINAXNOW: SARS Coronavirus 2 Ag: NEGATIVE

## 2023-08-29 MED ORDER — OSELTAMIVIR PHOSPHATE 75 MG PO CAPS: 75.0000 mg | ORAL_CAPSULE | Freq: Two times a day (BID) | ORAL | 0 refills | Status: DC

## 2023-08-29 MED ORDER — HYDROCOD POLI-CHLORPHE POLI ER 10-8 MG/5ML PO SUER: 5.0000 mL | Freq: Two times a day (BID) | ORAL | 0 refills | Status: DC | PRN

## 2023-08-29 MED ORDER — PREDNISONE 20 MG PO TABS: 40.0000 mg | ORAL_TABLET | Freq: Every day | ORAL | 0 refills | Status: DC

## 2023-08-29 NOTE — Telephone Encounter (Signed)
 Copied from CRM [id number]. Topic: Clinical - Red Word Triage >> Aug 29, 2023 12:05 PM [name] [name] wrote: Red Word that prompted transfer to Nurse Triage: Chest heaviness, headaches and sinus problems. Reason for Disposition  [1] Sinus pain (not just congestion) AND [2] fever  Answer Assessment - Initial Assessment Questions 1. LOCATION: "Where does it hurt?"      I'm having a terrible cough, weak, sinus issues, fever 101, headaches and chest heaviness.   I know I have bronchitis.   I feel like I did when I had Covid a couple years ago.     2. ONSET: "When did the sinus pain start?"  (e.g., hours, days)      The cough started last Sat. During the night.   I'm coughing at night mostly but yesterday at noon the other symptoms started.  Having chills and body aches too.  A little nasal congestion.   My sinuses are hurting under my eyes.   The headache is at the back of my head.   A little ear pressure. 3. SEVERITY: "How bad is the pain?"   (Scale 1-10; mild, moderate or severe)   - MILD (1-3): doesn't interfere with normal activities    - MODERATE (4-7): interferes with normal activities (e.g., work or school) or awakens from sleep   - SEVERE (8-10): excruciating pain and patient unable to do any normal activities        Moderate 4. RECURRENT SYMPTOM: "Have you ever had sinus problems before?" If Yes, ask: "When was the last time?" and "What happened that time?"      No    5. NASAL CONGESTION: "Is the nose blocked?" If Yes, ask: "Can you open it or must you breathe through your mouth?"     Yes 6. NASAL DISCHARGE: "Do you have discharge from your nose?" If so ask, "What color?"     No   Mostly stopped up 7. FEVER: "Do you have a fever?" If Yes, ask: "What is it, how was it measured, and when did it start?"      Yes 8. OTHER SYMPTOMS: "Do you have any other symptoms?" (e.g., sore throat, cough, earache, difficulty breathing)     See above 9. PREGNANCY: "Is there any chance you are pregnant?" "When  was your last menstrual period?"     N/A due to age  Protocols used: Sinus Pain or Congestion-A-AH  Chief Complaint: Sinus congestion, headaches, fever, chest heaviness, coughing Symptoms: fever body aches Frequency: Sat. started Pertinent Negatives: Patient denies N/A Disposition: [] ED /[] Urgent Care (no appt availability in office) / [x] Appointment(In office/virtual)/ []  [facility]/ [] Home Care/ [] Refused Recommended Disposition /[] [facility]/ []  Follow-up with PCP Additional Notes: Appt made with Worthy Rancher, NP at [facility] for today at 3:15.

## 2023-08-29 NOTE — Progress Notes (Signed)
 Acute Office Visit  Subjective:     Patient ID: [name] [name], female    DOB: 12/19/1955, 68 y.o.   MRN: [id number]  No chief complaint on file.   HPI Patient is in today with complaints of cough, sinus pressure, congestion, fever that worsened over the last 2 days.  Has been taken over-the-counter Sudafed and an cold and cough medication without much relief appetite has been decreased.  No known sick contacts.  Husband is here with her today.  Review of Systems  Constitutional:  Positive for chills, fever and malaise/fatigue.  HENT:  Positive for congestion.   Respiratory:  Positive for cough. Negative for shortness of breath and wheezing.   Cardiovascular: Negative.   Gastrointestinal: Negative.   Musculoskeletal: Negative.   Skin: Negative.   Neurological: Negative.   Psychiatric/Behavioral: Negative.    All other systems reviewed and are negative. Past Medical History:  Diagnosis Date  . Allergy    seasonal  . Anxiety   . Arthritis   . Asthma   . Cataract   . Complication of anesthesia    headache after neck surgery  . Ehlers-Danlos syndrome type III   . Fatty liver   . Fibromyalgia   . GERD (gastroesophageal reflux disease)    history of  . Heart murmur   . History of blood in urine   . History of bronchitis   . History of cholelithiasis   . History of kidney stones   . Hyperlipemia   . Hyperparathyroidism (HCC)   . Hypertension    no medication needed since bariatric surgery  . Insomnia   . Leg pain   . Low back pain   . Obese    history of  . Osteoporosis   . Pneumonia    history of   . PONV (postoperative nausea and vomiting)   . Pre-diabetes    no since weight loss  . Sleep apnea    improved since weight loss    Social History   Socioeconomic History  . Marital status: Married    Spouse name: Not on file  . Number of children: 1  . Years of education: Not on file  . Highest education level: Not on file  Occupational History  .  Occupation: Retired  Tobacco Use  . Smoking status: Never  . Smokeless tobacco: Never  Vaping Use  . Vaping status: Never Used  Substance and Sexual Activity  . Alcohol use: Yes    Alcohol/week: 0.0 standard drinks of alcohol    Comment: rare  . Drug use: No  . Sexual activity: Not on file  Other Topics Concern  . Not on file  Social History Narrative   Lives with husband in a one story home.  Has 1 daughter.     On disability in 2009.  Used to work as an Magazine features editor at [employer]      Social Drivers of Health   Financial Resource Strain: Low Risk  (11/27/2021)   Overall Financial Resource Strain (CARDIA)   . Difficulty of Paying Living Expenses: Not hard at all  Food Insecurity: No Food Insecurity (10/19/2022)   Hunger Vital Sign   . Worried About Programme researcher, broadcasting/film/video in the Last Year: Never true   . Ran Out of Food in the Last Year: Never true  Transportation Needs: No Transportation Needs (10/19/2022)   PRAPARE - Transportation   . Lack of Transportation (Medical): No   . Lack of Transportation (Non-Medical): No  Physical Activity: Sufficiently Active (11/27/2021)   Exercise Vital Sign   . Days of Exercise per Week: 5 days   . Minutes of Exercise per Session: 30 min  Stress: No Stress Concern Present (11/27/2021)   [employer] of Occupational Health - Occupational Stress Questionnaire   . Feeling of Stress : Not at all  Social Connections: Moderately Isolated (11/27/2021)   Social Connection and Isolation Panel [NHANES]   . Frequency of Communication with Friends and Family: More than three times a week   . Frequency of Social Gatherings with Friends and Family: More than three times a week   . Attends Religious Services: Never   . Active Member of Clubs or Organizations: No   . Attends Banker Meetings: Never   . Marital Status: Married  Catering manager Violence: Not At Risk (10/19/2022)   Humiliation, Afraid, Rape, and Kick questionnaire   . Fear of  Current or Ex-Partner: No   . Emotionally Abused: No   . Physically Abused: No   . Sexually Abused: No    Past Surgical History:  Procedure Laterality Date  . achilles tendon tibial tendon fusion     4 surgeries  . APPENDECTOMY    . BREAST BIOPSY Right   . BREAST BIOPSY Left   . BREAST BIOPSY Left   . CERVICAL DISCECTOMY     2010  . CESAREAN SECTION    . CHOLECYSTECTOMY    . COLONOSCOPY    . CYSTOSCOPY WITH RETROGRADE PYELOGRAM, URETEROSCOPY AND STENT PLACEMENT Right 08/01/2017   Procedure: CYSTOSCOPY WITH RETROGRADE PYELOGRAM, URETEROSCOPY AND STENT PLACEMENT;  Surgeon: Sebastian Ache, MD;  Location: [facility];  Service: Urology;  Laterality: Right;  . GASTROPLASTY DUODENAL SWITCH  2017  . HOLMIUM LASER APPLICATION Right 08/01/2017   Procedure: HOLMIUM LASER APPLICATION;  Surgeon: Sebastian Ache, MD;  Location: [facility];  Service: Urology;  Laterality: Right;  . JOINT REPLACEMENT Right    partial joint replacement knee  . LAPAROSCOPIC APPENDECTOMY N/A 10/20/2022   Procedure: APPENDECTOMY LAPAROSCOPIC;  Surgeon: Kinsinger, De Blanch, MD;  Location: [facility] OR;  Service: General;  Laterality: N/A;  . LAPAROSCOPIC GASTRIC RESTRICTIVE DUODENAL PROCEDURE (DUODENAL SWITCH)     2017  . lower back     2008  . spinal injections    . SPINE SURGERY     L4-L5, C6-C7   . UPPER GASTROINTESTINAL ENDOSCOPY      Family History  Problem Relation Age of Onset  . Parkinson's disease Mother        Deceased, 55  . Heart disease Father        Deceased, 75  . Pancreatic cancer Sister        Survivor  . Healthy Daughter   . Colon cancer Neg Hx   . Colon polyps Neg Hx   . Esophageal cancer Neg Hx   . Rectal cancer Neg Hx   . Stomach cancer Neg Hx   . Breast cancer Neg Hx     Not on File  Current Outpatient Medications on File Prior to Visit  Medication Sig Dispense Refill  . acetaminophen (TYLENOL) 500 MG tablet Take 2 tablets (1,000 mg total) by  mouth every 8 (eight) hours as needed. 30 tablet 0  . albuterol (VENTOLIN HFA) 108 (90 Base) MCG/ACT inhaler Inhale 2 puffs into the lungs every 6 (six) hours as needed for wheezing. 6.7 g 3  . ALPRAZolam (XANAX) 0.5 MG tablet TAKE 1 TABLET BY MOUTH 2 TIMES  DAILY AS NEEDED FOR ANXIETY. 60 tablet 5  . amLODipine (NORVASC) 10 MG tablet Take 1 tablet (10 mg total) by mouth daily. 90 tablet 3  . ASPIRIN LOW DOSE 81 MG tablet Take 81 mg by mouth 2 (two) times daily.    Marland Kitchen atorvastatin (LIPITOR) 40 MG tablet Take 1 tablet (40 mg total) by mouth daily. 90 tablet 3  . cyclobenzaprine (FLEXERIL) 5 MG tablet Take 5 mg by mouth 3 (three) times daily as needed for muscle spasms.    Marland Kitchen docusate sodium (COLACE) 100 MG capsule Take 100 mg by mouth 2 (two) times daily.    . fexofenadine (ALLEGRA) 180 MG tablet Take 180 mg by mouth daily as needed for allergies.    Marland Kitchen gabapentin (NEURONTIN) 300 MG capsule Take 300 mg by mouth at bedtime.    . Multiple Vitamins-Minerals (BARIATRIC MULTIVITAMINS/IRON PO) Take 3 tablets by mouth daily.     . ondansetron (ZOFRAN-ODT) 4 MG disintegrating tablet Take 1 tablet (4 mg total) by mouth every 8 (eight) hours as needed for nausea or vomiting. 20 tablet 0  . solifenacin (VESICARE) 10 MG tablet Take 1 tablet (10 mg total) by mouth daily. 90 tablet 3  . Venlafaxine HCl 225 MG TB24 Take 1 tablet (225 mg total) by mouth daily. 90 tablet 3  . nitroGLYCERIN (NITROSTAT) 0.4 MG SL tablet Place 1 tablet (0.4 mg total) under the tongue every 5 (five) minutes as needed for chest pain. 25 tablet 2   No current facility-administered medications on file prior to visit.    BP 136/74 (BP Location: Left Arm, Patient Position: Sitting, Cuff Size: Large)   Pulse 99   Temp 98.8 F (37.1 C) (Oral)   Ht 5\' 1"  (1.549 m)   Wt 196 lb 9.6 oz (89.2 kg)   SpO2 92%   BMI 37.15 kg/m chart      Objective:    BP 136/74 (BP Location: Left Arm, Patient Position: Sitting, Cuff Size: Large)   Pulse 99    Temp 98.8 F (37.1 C) (Oral)   Ht 5\' 1"  (1.549 m)   Wt 196 lb 9.6 oz (89.2 kg)   SpO2 92%   BMI 37.15 kg/m    Physical Exam Vitals and nursing note reviewed.  Constitutional:      Appearance: Normal appearance. She is normal weight.  HENT:     Right Ear: Tympanic membrane, ear canal and external ear normal.     Left Ear: Tympanic membrane, ear canal and external ear normal.     Nose: Congestion and rhinorrhea present.  Cardiovascular:     Rate and Rhythm: Normal rate and regular rhythm.  Pulmonary:     Effort: Pulmonary effort is normal.     Breath sounds: Normal breath sounds.  Abdominal:     General: Abdomen is flat. Bowel sounds are normal.     Palpations: Abdomen is soft.  Musculoskeletal:        General: Normal range of motion.     Cervical back: Normal range of motion and neck supple.  Skin:    General: Skin is warm and dry.  Neurological:     General: No focal deficit present.     Mental Status: She is alert and oriented to person, place, and time.  Psychiatric:        Mood and Affect: Mood normal.        Behavior: Behavior normal.   Results for orders placed or performed in visit on 08/29/23  POC COVID-19  Result Value Ref Range   SARS Coronavirus 2 Ag Negative Negative  POCT Influenza A/B  Result Value Ref Range   Influenza A, POC Positive (A) Negative   Influenza B, POC Negative Negative  POCT respiratory syncytial virus  Result Value Ref Range   RSV Rapid Ag Negative         Assessment & Plan:   Problem List Items Addressed This Visit   None Visit Diagnoses       Influenza A    -  Primary   Relevant Medications   oseltamivir (TAMIFLU) 75 MG capsule     Acute cough       Relevant Orders   POC COVID-19 (Completed)   POCT Influenza A/B (Completed)   POCT respiratory syncytial virus (Completed)       Meds ordered this encounter  Medications  . oseltamivir (TAMIFLU) 75 MG capsule    Sig: Take 1 capsule (75 mg total) by mouth 2 (two)  times daily.    Dispense:  10 capsule    Refill:  0  . chlorpheniramine-HYDROcodone (TUSSIONEX) 10-8 MG/5ML    Sig: Take 5 mLs by mouth every 12 (twelve) hours as needed for cough.    Dispense:  120 mL    Refill:  0  . predniSONE (DELTASONE) 20 MG tablet    Sig: Take 2 tablets (40 mg total) by mouth daily with breakfast.    Dispense:  10 tablet    Refill:  0    Call the office with any questions or concerns.  Recheck as scheduled and sooner as needed.  Eulis Foster, FNP

## 2023-09-01 ENCOUNTER — Ambulatory Visit: Payer: Self-pay | Admitting: Internal Medicine

## 2023-09-01 NOTE — Telephone Encounter (Signed)
 Copied from CRM [id number]. Topic: Clinical - Red Word Triage >> Sep 01, 2023 10:03 AM [name] [name] wrote: [facility] that prompted transfer to Nurse Triage: Patient experiencing some breathing issues. Diagnosed with influenza A on 3/14, but she is still experiencing some wheezing, and her albuterol is not helping. Patient stated she can only breathe comfortably sitting straight up.   Chief Complaint: Breathing problem Symptoms: shortness of breath, dizziness this morning, didn't sleep last night, cough--bright yellow mucous Frequency: x 2-3 days Pertinent Negatives: Patient denies fever at this time, nausea, vomiting, diarrhea, chest pain Disposition: [x] ED /[] Urgent Care (no appt availability in office) / [] Appointment(In office/virtual)/ []  [facility]/ [] Home Care/ [] Refused Recommended Disposition /[] [facility]/ []  Follow-up with PCP Additional Notes: Patient called and advised that she was diagnosed with Flu A on 3/14.  She states that she used to have chronic Patient states she is having wheezing and "rattling" when breathing and she localizes the issue to the upper right area of her chest. Patient states that she feels like she needs a chest x-ray and possibly breathing treatments. Patient states she is short of breath even at rest, coughing up bright green mucous, heart rate is 107 (patient said her heart rate is usually in the 90s), and she has had a continuous hacking cough. Patient denies any fever at this time, nausea, vomiting, diarrhea, or chest pain. Patient states that she didn't sleep last night because her breathing was so bad she was scared to lay down and try to sleep.  She did say that her Oxygen saturation was 96% when she last checked it. She states she used her Albuterol multiple times last night with no relief. She also states that she had some dizziness this morning and felt light headed but she wasn't sure if that was from her not sleeping any last  night. Patient is advised that at this time the recommendation is for her to go to the Emergency Room for further evaluation.  Patient is agreeable to this.  She is also advised that if anything worsens, she can call an ambulance to take her to the hospital at any time.  Patient verbalized understanding.   Reason for Disposition  [1] MODERATE difficulty breathing (e.g., speaks in phrases, SOB even at rest, pulse 100-120) AND [2] NEW-onset or WORSE than normal  Answer Assessment - Initial Assessment Questions 1. RESPIRATORY STATUS: "Describe your breathing?" (e.g., wheezing, shortness of breath, unable to speak, severe coughing)      Wheezing, "rattling", and very short of breath 2. ONSET: "When did this breathing problem begin?"      2 days ago 3. PATTERN "Does the difficult breathing come and go, or has it been constant since it started?"      constant 4. SEVERITY: "How bad is your breathing?" (e.g., mild, moderate, severe)    - MILD: No SOB at rest, mild SOB with walking, speaks normally in sentences, can lie down, no retractions, pulse < 100.    - MODERATE: SOB at rest, SOB with minimal exertion and prefers to sit, cannot lie down flat, speaks in phrases, mild retractions, audible wheezing, pulse 100-120.    - SEVERE: Very SOB at rest, speaks in single words, struggling to breathe, sitting hunched forward, retractions, pulse > 120      moderate 5. RECURRENT SYMPTOM: "Have you had difficulty breathing before?" If Yes, ask: "When was the last time?" and "What happened that time?"      Yes--been 20 years  6. 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 LUNG HISTORY: "Do you have any history of lung disease?"  (e.g., pulmonary embolus, asthma, emphysema)     Asthmatic bronchitis & inflammation 8. CAUSE: "What do you think is causing the breathing problem?"      Flu A 9.  OTHER SYMPTOMS: "Do you have any other symptoms? (e.g., dizziness, runny nose, cough, chest pain, fever)     Dizziness this morning--light headed, cough-bright green mucous 10. O2 SATURATION MONITOR:  "Do you use an oxygen saturation monitor (pulse oximeter) at home?" If Yes, ask: "What is your reading (oxygen level) today?" "What is your usual oxygen saturation reading?" (e.g., 95%)       Yes--been reading about 96.  Pulse was 107 12. TRAVEL: "Have you traveled out of the country in the last month?" (e.g., travel history, exposures)       No  Protocols used: Breathing Difficulty-A-AH

## 2023-09-02 ENCOUNTER — Emergency Department (HOSPITAL_BASED_OUTPATIENT_CLINIC_OR_DEPARTMENT_OTHER): Admitting: Radiology

## 2023-09-02 ENCOUNTER — Other Ambulatory Visit: Payer: Self-pay

## 2023-09-02 ENCOUNTER — Emergency Department (HOSPITAL_BASED_OUTPATIENT_CLINIC_OR_DEPARTMENT_OTHER)
Admission: EM | Admit: 2023-09-02 | Discharge: 2023-09-02 | Disposition: A | Discharge status: Home / Self Care | Attending: Emergency Medicine | Admitting: Emergency Medicine

## 2023-09-02 DIAGNOSIS — E039 Hypothyroidism, unspecified: Secondary | ICD-10-CM | POA: Diagnosis not present

## 2023-09-02 DIAGNOSIS — Z7982 Long term (current) use of aspirin: Secondary | ICD-10-CM | POA: Diagnosis not present

## 2023-09-02 DIAGNOSIS — Z79899 Other long term (current) drug therapy: Secondary | ICD-10-CM | POA: Insufficient documentation

## 2023-09-02 DIAGNOSIS — J40 Bronchitis, not specified as acute or chronic: Secondary | ICD-10-CM | POA: Insufficient documentation

## 2023-09-02 DIAGNOSIS — R0602 Shortness of breath: Secondary | ICD-10-CM | POA: Insufficient documentation

## 2023-09-02 DIAGNOSIS — I10 Essential (primary) hypertension: Secondary | ICD-10-CM | POA: Insufficient documentation

## 2023-09-02 DIAGNOSIS — R059 Cough, unspecified: Secondary | ICD-10-CM | POA: Diagnosis present

## 2023-09-02 DIAGNOSIS — J45909 Unspecified asthma, uncomplicated: Secondary | ICD-10-CM | POA: Insufficient documentation

## 2023-09-02 LAB — CBC WITH DIFFERENTIAL/PLATELET
Abs Immature Granulocytes: 0.02 10*3/uL (ref 0.00–0.07)
Basophils Absolute: 0 10*3/uL (ref 0.0–0.1)
Basophils Relative: 0 %
Eosinophils Absolute: 0 10*3/uL (ref 0.0–0.5)
Eosinophils Relative: 0 %
HCT: 40.8 % (ref 36.0–46.0)
Hemoglobin: 13.2 g/dL (ref 12.0–15.0)
Immature Granulocytes: 0 %
Lymphocytes Relative: 46 %
Lymphs Abs: 4.3 10*3/uL — ABNORMAL HIGH (ref 0.7–4.0)
MCH: 28.7 pg (ref 26.0–34.0)
MCHC: 32.4 g/dL (ref 30.0–36.0)
MCV: 88.7 fL (ref 80.0–100.0)
Monocytes Absolute: 0.9 10*3/uL (ref 0.1–1.0)
Monocytes Relative: 10 %
Neutro Abs: 4 10*3/uL (ref 1.7–7.7)
Neutrophils Relative %: 44 %
Platelets: 376 10*3/uL (ref 150–400)
RBC: 4.6 MIL/uL (ref 3.87–5.11)
RDW: 13.2 % (ref 11.5–15.5)
WBC: 9.3 10*3/uL (ref 4.0–10.5)
nRBC: 0 % (ref 0.0–0.2)

## 2023-09-02 LAB — BASIC METABOLIC PANEL
Anion gap: 9 (ref 5–15)
BUN: 20 mg/dL (ref 8–23)
CO2: 29 mmol/L (ref 22–32)
Calcium: 8.4 mg/dL — ABNORMAL LOW (ref 8.9–10.3)
Chloride: 101 mmol/L (ref 98–111)
Creatinine, Ser: 0.76 mg/dL (ref 0.44–1.00)
GFR, Estimated: >60 mL/min (ref >60)
Glucose, Bld: 104 mg/dL — ABNORMAL HIGH (ref 70–99)
Potassium: 3.5 mmol/L (ref 3.5–5.1)
Sodium: 139 mmol/L (ref 135–145)

## 2023-09-02 LAB — BRAIN NATRIURETIC PEPTIDE: B Natriuretic Peptide: 22.9 pg/mL (ref 0.0–100.0)

## 2023-09-02 MED ORDER — ALBUTEROL SULFATE (2.5 MG/3ML) 0.083% IN NEBU
5.0000 mg | INHALATION_SOLUTION | Freq: Once | RESPIRATORY_TRACT | Status: AC
  Administered 2023-09-02: 5 mg via RESPIRATORY_TRACT
  Filled 2023-09-02: qty 6

## 2023-09-02 MED ORDER — IPRATROPIUM-ALBUTEROL 0.5-2.5 (3) MG/3ML IN SOLN
3.0000 mL | Freq: Once | RESPIRATORY_TRACT | Status: AC
  Administered 2023-09-02: 3 mL via RESPIRATORY_TRACT
  Filled 2023-09-02: qty 3

## 2023-09-02 MED ORDER — DEXAMETHASONE SODIUM PHOSPHATE 10 MG/ML IJ SOLN
10.0000 mg | Freq: Once | INTRAMUSCULAR | Status: AC
  Administered 2023-09-02: 10 mg via INTRAVENOUS
  Filled 2023-09-02: qty 1

## 2023-09-02 MED ORDER — ALBUTEROL SULFATE HFA 108 (90 BASE) MCG/ACT IN AERS: 2.0000 | INHALATION_SPRAY | RESPIRATORY_TRACT | Status: DC | PRN

## 2023-09-02 NOTE — ED Notes (Signed)
 Discharge instructions reviewed.   Opportunity for questions and concerns provided.   Alert, oriented and ambulatory.   Displays no signs of distress.   Encouraged to use home albuterol inhaler and to follow up with PCP for reevaluation.

## 2023-09-02 NOTE — ED Provider Notes (Signed)
 [place] EMERGENCY DEPARTMENT AT [facility] Provider Note   CSN: [id number] Arrival date & time: 09/02/23  1259     History  Chief Complaint  Patient presents with   Shortness of Breath    [name] [name] is a 68 y.o. female.  Patient is a 68 year old female with a history of hypertension, Ehlers-Danlos syndrome, hyperlipidemia, hyperparathyroidism who presents with cough and wheezing.  She does have a history of asthma but does not typically use inhalers.  She does have a inhaler that she uses occasionally, mostly during allergy type season.  She was diagnosed with the flu 5 days ago.  She was started on Tamiflu, prednisone and Tussionex cough syrup.  She is no longer having any fevers.  However when she coughs or breathes, she feels like she has some crackles on her right side.  She also has felt like she has been wheezy.  She has had some shortness of breath during coughing spells but does not get short of breath on ambulation or other times.  She denies any chest pain.  No increased leg swelling.  No calf tenderness.  No vomiting.  Today was her last day of the prednisone.       Home Medications Prior to Admission medications   Medication Sig Start Date End Date Taking? Authorizing Provider  acetaminophen (TYLENOL) 500 MG tablet Take 2 tablets (1,000 mg total) by mouth every 8 (eight) hours as needed. 10/21/22   Maczis, Elmer Sow, PA-C  albuterol (VENTOLIN HFA) 108 (90 Base) MCG/ACT inhaler Inhale 2 puffs into the lungs every 6 (six) hours as needed for wheezing. 03/24/23   Myrlene Broker, MD  ALPRAZolam Prudy Feeler) 0.5 MG tablet TAKE 1 TABLET BY MOUTH 2 TIMES DAILY AS NEEDED FOR ANXIETY. 08/05/23   Myrlene Broker, MD  amLODipine (NORVASC) 10 MG tablet Take 1 tablet (10 mg total) by mouth daily. 03/24/23   Myrlene Broker, MD  ASPIRIN LOW DOSE 81 MG tablet Take 81 mg by mouth 2 (two) times daily. 01/28/23   [provider]  atorvastatin (LIPITOR) 40 MG  tablet Take 1 tablet (40 mg total) by mouth daily. 03/24/23   Myrlene Broker, MD  chlorpheniramine-HYDROcodone (TUSSIONEX) 10-8 MG/5ML Take 5 mLs by mouth every 12 (twelve) hours as needed for cough. 08/29/23   Eulis Foster, FNP  cyclobenzaprine (FLEXERIL) 5 MG tablet Take 5 mg by mouth 3 (three) times daily as needed for muscle spasms. 10/07/22   [provider]  docusate sodium (COLACE) 100 MG capsule Take 100 mg by mouth 2 (two) times daily. 01/28/23   [provider]  fexofenadine (ALLEGRA) 180 MG tablet Take 180 mg by mouth daily as needed for allergies.    [provider]  gabapentin (NEURONTIN) 300 MG capsule Take 300 mg by mouth at bedtime. 04/21/23   [provider]  Multiple Vitamins-Minerals (BARIATRIC MULTIVITAMINS/IRON PO) Take 3 tablets by mouth daily.     [provider]  nitroGLYCERIN (NITROSTAT) 0.4 MG SL tablet Place 1 tablet (0.4 mg total) under the tongue every 5 (five) minutes as needed for chest pain. 12/12/22 03/12/23  Parke Poisson, MD  ondansetron (ZOFRAN-ODT) 4 MG disintegrating tablet Take 1 tablet (4 mg total) by mouth every 8 (eight) hours as needed for nausea or vomiting. 12/29/22   Tegeler, Canary Brim, MD  oseltamivir (TAMIFLU) 75 MG capsule Take 1 capsule (75 mg total) by mouth 2 (two) times daily. 08/29/23   Eulis Foster, FNP  predniSONE (DELTASONE) 20 MG tablet Take 2 tablets (40 mg total) by mouth daily with breakfast. 08/29/23   Worthy Rancher B, FNP  solifenacin (VESICARE) 10 MG tablet Take 1 tablet (10 mg total) by mouth daily. 03/24/23   Myrlene Broker, MD  Venlafaxine HCl 225 MG TB24 Take 1 tablet (225 mg total) by mouth daily. 03/24/23   Myrlene Broker, MD      Allergies    Patient has no known allergies.    Review of Systems   Review of Systems  Constitutional:  Positive for fatigue. Negative for chills, diaphoresis and fever.  HENT:  Positive for congestion. Negative for rhinorrhea and  sneezing.   Eyes: Negative.   Respiratory:  Positive for cough and wheezing. Negative for chest tightness and shortness of breath.   Cardiovascular:  Negative for chest pain and leg swelling.  Gastrointestinal:  Negative for abdominal pain, blood in stool, diarrhea, nausea and vomiting.  Genitourinary:  Negative for difficulty urinating, flank pain, frequency and hematuria.  Musculoskeletal:  Negative for arthralgias and back pain.  Skin:  Negative for rash.  Neurological:  Negative for dizziness, speech difficulty, weakness, numbness and headaches.    Physical Exam Updated Vital Signs BP 115/66   Pulse 75   Temp 98.2 F (36.8 C)   Resp 10   SpO2 96%  Physical Exam Constitutional:      Appearance: She is well-developed.  HENT:     Head: Normocephalic and atraumatic.  Eyes:     Pupils: Pupils are equal, round, and reactive to light.  Cardiovascular:     Rate and Rhythm: Normal rate and regular rhythm.     Heart sounds: Normal heart sounds.  Pulmonary:     Effort: Pulmonary effort is normal. No respiratory distress.     Breath sounds: Wheezing present. No rales.     Comments: Patient has expiratory wheezes in the bases bilaterally.  No Rales, no increased work of breathing Chest:     Chest wall: No tenderness.  Abdominal:     General: Bowel sounds are normal.     Palpations: Abdomen is soft.     Tenderness: There is no abdominal tenderness. There is no guarding or rebound.  Musculoskeletal:        General: Normal range of motion.     Cervical back: Normal range of motion and neck supple.     Comments: Trace nonpitting edema to legs bilaterally, no calf tenderness  Lymphadenopathy:     Cervical: No cervical adenopathy.  Skin:    General: Skin is warm and dry.     Findings: No rash.  Neurological:     Mental Status: She is alert and oriented to person, place, and time.     ED Results / Procedures / Treatments   Labs (all labs ordered are listed, but only abnormal  results are displayed) Labs Reviewed  BASIC METABOLIC PANEL - Abnormal; Notable for the following components:      Result Value   Glucose, Bld 104 (*)    Calcium 8.4 (*)    All other components within normal limits  CBC WITH DIFFERENTIAL/PLATELET - Abnormal; Notable for the following components:   Lymphs Abs 4.3 (*)    All other components within normal limits  BRAIN NATRIURETIC PEPTIDE    EKG EKG Interpretation Date/Time:  Tuesday September 02 2023 13:34:13 EDT Ventricular Rate:  83 PR Interval:  182 QRS Duration:  94 QT Interval:  366 QTC Calculation: 430 R Axis:   -  14  Text Interpretation: Normal sinus rhythm Inferior infarct (cited on or before 15-Nov-2019) Cannot rule out Anterior infarct (cited on or before 22-Aug-2006) Abnormal ECG When compared with ECG of 18-Dec-2022 10:31, No significant change was found since last tracing no significant change Confirmed by Rolan Bucco [phone]) on 09/02/2023 6:49:08 PM  Radiology DG Chest 2 View Result Date: 09/02/2023 CLINICAL DATA:  Shortness of breath EXAM: CHEST - 2 VIEW COMPARISON:  X-ray 11/14/2019. Older examinations as well. CTA 11/14/2019 as well. FINDINGS: No consolidation, pneumothorax or effusion. No edema. Normal cardiopericardial silhouette without edema. Degenerative changes of the spine. Fixation hardware along the lower cervical spine at the edge of the imaging field. IMPRESSION: No acute cardiopulmonary disease. Electronically Signed   By: Karen Kays M.D.   On: 09/02/2023 15:56    Procedures Procedures    Medications Ordered in ED Medications  albuterol (VENTOLIN HFA) 108 (90 Base) MCG/ACT inhaler 2 puff (has no administration in time range)  dexamethasone (DECADRON) injection 10 mg (10 mg Intravenous Given 09/02/23 1933)  ipratropium-albuterol (DUONEB) 0.5-2.5 (3) MG/3ML nebulizer solution 3 mL (3 mLs Nebulization Given 09/02/23 1906)  albuterol (PROVENTIL) (2.5 MG/3ML) 0.083% nebulizer solution 5 mg (5 mg Nebulization  Given 09/02/23 2101)    ED Course/ Medical Decision Making/ A&P                                 Medical Decision Making Amount and/or Complexity of Data Reviewed Labs: ordered. Radiology: ordered.  Risk Prescription drug management.   Patient is a 68 year old who presents with cough and wheezing.  She recently was treated for the flu 5 days ago.  She did have some wheezing and diminished breath sounds on exam with some mild tachypnea.  She was given nebulizer treatments and Decadron.  Chest x-ray was performed which was interpreted by me and confirmed by the radiologist to show no evidence of pneumonia or pulmonary edema.  Her BNP is normal.  Other labs are nonconcerning.  She does not have other symptoms that would be more concerning for PE.  No symptoms of ACS.  She is feeling much better after the nebulizer treatments.  Her lungs just have few scarce wheezes on reexam.  She is ready to go home.  She was discharged home in good condition.  Was encouraged to use her inhaler 2 puffs every 4-6 hours for the next few days and then as needed.  She was encouraged to follow-up with her primary care doctor for recheck.  Return precautions were given.  Final Clinical Impression(s) / ED Diagnoses Final diagnoses:  Bronchitis    Rx / DC Orders ED Discharge Orders     None         Rolan Bucco, MD 09/02/23 2143

## 2023-09-02 NOTE — Discharge Instructions (Addendum)
 Use your inhalers at home as discussed.  Follow-up with your primary care doctor for recheck.  Return to the emergency room if you have any worsening symptoms.

## 2023-09-02 NOTE — ED Triage Notes (Signed)
 Pt caox4 ambulatory c/o SOB worsening since Sunday night. Dx with Flu A on Friday. Productive cough. Pt states "I hear a lot of crackles in the R side since Sunday as well." Has used inhaler at ome without relief.

## 2023-09-14 ENCOUNTER — Other Ambulatory Visit: Payer: Self-pay | Admitting: Internal Medicine

## 2023-11-05 DIAGNOSIS — H353132 Nonexudative age-related macular degeneration, bilateral, intermediate dry stage: Secondary | ICD-10-CM | POA: Diagnosis not present

## 2023-11-25 ENCOUNTER — Other Ambulatory Visit: Payer: Self-pay | Admitting: Internal Medicine

## 2023-11-25 DIAGNOSIS — Z8709 Personal history of other diseases of the respiratory system: Secondary | ICD-10-CM

## 2023-11-27 DIAGNOSIS — H524 Presbyopia: Secondary | ICD-10-CM | POA: Diagnosis not present

## 2023-11-27 DIAGNOSIS — H353121 Nonexudative age-related macular degeneration, left eye, early dry stage: Secondary | ICD-10-CM | POA: Diagnosis not present

## 2023-11-27 DIAGNOSIS — H353190 Nonexudative age-related macular degeneration, unspecified eye, stage unspecified: Secondary | ICD-10-CM | POA: Diagnosis not present

## 2023-12-15 DIAGNOSIS — Z6838 Body mass index (BMI) 38.0-38.9, adult: Secondary | ICD-10-CM | POA: Diagnosis not present

## 2023-12-15 DIAGNOSIS — Z1231 Encounter for screening mammogram for malignant neoplasm of breast: Secondary | ICD-10-CM | POA: Diagnosis not present

## 2023-12-15 DIAGNOSIS — Z01419 Encounter for gynecological examination (general) (routine) without abnormal findings: Secondary | ICD-10-CM | POA: Diagnosis not present

## 2024-01-23 ENCOUNTER — Ambulatory Visit: Payer: Self-pay

## 2024-01-23 NOTE — Telephone Encounter (Signed)
 FYI Only or Action Required?: FYI only for provider.  Patient was last seen in primary care on 08/29/2023 by Douglass Kenney NOVAK, FNP.  Called Nurse Triage reporting Flank Pain.  Symptoms began several months ago.  Interventions attempted: Rest, hydration, or home remedies.  Symptoms are: gradually worsening.  Triage Disposition: Go to ED Now (Notify PCP)  Patient/caregiver understands and will follow disposition?: Unsure      Copied from CRM [id number]. Topic: Clinical - Red Word Triage >> Jan 23, 2024  4:50 PM Jayma L wrote: Red Word that prompted transfer to Nurse Triage:   Patient is having severe pain on lower left side , thinks its a kidney stone, she said this is been going on for several months now and its getting worse. Said her pain is a 8/10 right now, asking for a appt if possible Reason for Disposition  [1] Sudden onset of severe flank pain AND [2] age > 60 years  Answer Assessment - Initial Assessment Questions 1. LOCATION: Where does it hurt? (e.g., left, right)     Left 2. ONSET: When did the pain start?     A few months, worsening - endorses sporadic pain that comes and goes - lasts 5-10 mins 3. SEVERITY: How bad is the pain? (e.g., Scale 1-10; mild, moderate, or severe)     8/10 4. PATTERN: Does the pain come and go, or is it constant?      Comes and goes 5. CAUSE: What do you think is causing the pain?     Thinks r/t kidney stone that required surgery - endorses sx are similar  6. OTHER SYMPTOMS:  Do you have any other symptoms? (e.g., fever, abdomen pain, vomiting, leg weakness, burning with urination, blood in urine)     denies 7. PREGNANCY:  Is there any chance you are pregnant? When was your last menstrual period?     N/a    [facility] strongly advised pt to go to UC/ED for further evaluation. Pt reports that she will attempt to contact specialist office to see if additional guidance can be provided.  Patient verbalized  understanding.  Protocols used: Flank Pain-A-AH

## 2024-01-26 DIAGNOSIS — Z96652 Presence of left artificial knee joint: Secondary | ICD-10-CM | POA: Diagnosis not present

## 2024-01-28 NOTE — Telephone Encounter (Signed)
 Did this patient ever get care? For 8/10 pain ongoing needs follow up status this is from last week.

## 2024-01-28 NOTE — Telephone Encounter (Signed)
 Ive called patient and LVM and looking through her chart it does not look she has. I was not in office when this message was sent after hours.

## 2024-01-29 NOTE — Telephone Encounter (Signed)
 2nd attempt leaving a voicemail.

## 2024-02-02 ENCOUNTER — Other Ambulatory Visit: Payer: Self-pay | Admitting: Internal Medicine

## 2024-02-02 NOTE — Telephone Encounter (Signed)
 Copied from CRM #[id number]. Topic: General - Other >> Jan 30, 2024  4:21 PM Burnard DEL wrote: Reason for CRM: Patient returned call to [facility] ,patient stated that her pain went away 10 minutes after calling in  She said that she has decided to just follow up with Dr Patrcia her urologist. She said that the pain comes and goes and she hasn't had another attack since last week Friday. However if Dr Rollene would ike to see her,she will schedule an appointment.

## 2024-02-02 NOTE — Telephone Encounter (Signed)
**Note De-identified  [place] Obfuscation** Please advise 

## 2024-02-02 NOTE — Telephone Encounter (Signed)
 If pain is resolved ok to not have visit

## 2024-03-25 ENCOUNTER — Other Ambulatory Visit: Payer: Self-pay | Admitting: Internal Medicine

## 2024-03-25 DIAGNOSIS — E782 Mixed hyperlipidemia: Secondary | ICD-10-CM

## 2024-04-03 DIAGNOSIS — M25561 Pain in right knee: Secondary | ICD-10-CM | POA: Diagnosis not present

## 2024-04-05 ENCOUNTER — Other Ambulatory Visit: Payer: Self-pay | Admitting: Internal Medicine

## 2024-04-09 DIAGNOSIS — G8929 Other chronic pain: Secondary | ICD-10-CM | POA: Diagnosis not present

## 2024-04-09 DIAGNOSIS — M25561 Pain in right knee: Secondary | ICD-10-CM | POA: Diagnosis not present

## 2024-04-16 ENCOUNTER — Ambulatory Visit: Admitting: Internal Medicine

## 2024-04-16 ENCOUNTER — Encounter: Payer: Self-pay | Admitting: Internal Medicine

## 2024-04-16 ENCOUNTER — Ambulatory Visit (INDEPENDENT_AMBULATORY_CARE_PROVIDER_SITE_OTHER)

## 2024-04-16 VITALS — BP 122/80 | HR 87 | Ht 60.0 in | Wt 197.2 lb

## 2024-04-16 VITALS — BP 122/80 | HR 87 | Ht 60.0 in | Wt 197.3 lb

## 2024-04-16 DIAGNOSIS — I10 Essential (primary) hypertension: Secondary | ICD-10-CM | POA: Diagnosis not present

## 2024-04-16 DIAGNOSIS — Z23 Encounter for immunization: Secondary | ICD-10-CM

## 2024-04-16 DIAGNOSIS — E782 Mixed hyperlipidemia: Secondary | ICD-10-CM

## 2024-04-16 DIAGNOSIS — Z0000 Encounter for general adult medical examination without abnormal findings: Secondary | ICD-10-CM

## 2024-04-16 DIAGNOSIS — Q796 Ehlers-Danlos syndrome, unspecified: Secondary | ICD-10-CM

## 2024-04-16 DIAGNOSIS — J4520 Mild intermittent asthma, uncomplicated: Secondary | ICD-10-CM | POA: Diagnosis not present

## 2024-04-16 MED ORDER — SEMAGLUTIDE-WEIGHT MANAGEMENT 1 MG/0.5ML ~~LOC~~ SOAJ: 1.0000 mg | SUBCUTANEOUS | 0 refills | Status: DC

## 2024-04-16 MED ORDER — SEMAGLUTIDE-WEIGHT MANAGEMENT 1.7 MG/0.75ML ~~LOC~~ SOAJ: 1.7000 mg | SUBCUTANEOUS | 0 refills | Status: DC

## 2024-04-16 MED ORDER — SEMAGLUTIDE-WEIGHT MANAGEMENT 2.4 MG/0.75ML ~~LOC~~ SOAJ: 2.4000 mg | SUBCUTANEOUS | 0 refills | Status: DC

## 2024-04-16 MED ORDER — SEMAGLUTIDE-WEIGHT MANAGEMENT 0.25 MG/0.5ML ~~LOC~~ SOAJ: 0.2500 mg | SUBCUTANEOUS | 0 refills | Status: AC

## 2024-04-16 MED ORDER — SEMAGLUTIDE-WEIGHT MANAGEMENT 0.5 MG/0.5ML ~~LOC~~ SOAJ: 0.5000 mg | SUBCUTANEOUS | 0 refills | Status: AC

## 2024-04-16 NOTE — Assessment & Plan Note (Signed)
Checking lipid panel and adjust lipitor as needed.  

## 2024-04-16 NOTE — Progress Notes (Signed)
   Subjective:   Patient ID: [name] [name], female    DOB: July 12, 1955, 68 y.o.   MRN: [id number]  The patient is here for physical. Pertinent topics discussed: Discussed the use of AI scribe software for clinical note transcription with the patient, who gave verbal consent to proceed.  History of Present Illness [name] [name] is a 68 year old female who presents with musculoskeletal pain after pulling a muscle while tightening a saddle.  Two weeks ago, she experienced a popping sensation and immediate pain while tightening a saddle, accompanied by temporary blurred vision. The pain was significant but not life-threatening, and she was able to breathe normally. She has been managing the pain with Tylenol , noting a decrease in initial swelling, though soreness persists with gradual improvement.  She is currently on prednisone  for her knee, which she believes may also aid in reducing inflammation from the muscle injury. 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  She reports occasional severe pain in the left lower ribcage, which she associates with a past appendectomy. The pain is near the diaphragm and can be triggered by certain movements or stomach pressure. No new gastrointestinal symptoms such as diarrhea, constipation, or heartburn.  She experiences positional heart palpitations when transitioning from sitting to lying down, which she monitors with a smartwatch. No associated symptoms of concern such as chest pain or shortness of breath.  She has a history of weight fluctuations and was previously approved for Wegovy  for weight loss but did not start it due to knee surgery. She has regained some weight but remains 50 pounds below her starting weight.  PMH, [facility], social history reviewed and updated  Review of Systems  Constitutional: Negative.   HENT:  Negative.    Eyes: Negative.   Respiratory:  Negative for cough, chest tightness and shortness of breath.   Cardiovascular:  Positive for chest pain. Negative for palpitations and leg swelling.  Gastrointestinal:  Negative for abdominal distention, abdominal pain, constipation, diarrhea, nausea and vomiting.  Musculoskeletal: Negative.   Skin: Negative.   Neurological: Negative.   Psychiatric/Behavioral: Negative.      Objective:  Physical Exam Constitutional:      Appearance: She is well-developed. She is obese.  HENT:     Head: Normocephalic and atraumatic.  Cardiovascular:     Rate and Rhythm: Normal rate and regular rhythm.  Pulmonary:     Effort: Pulmonary effort is normal. No respiratory distress.     Breath sounds: Normal breath sounds. No wheezing or rales.     Comments: Chest wall tenderness midline Chest:     Chest wall: Tenderness present.  Abdominal:     General: Bowel sounds are normal. There is no distension.     Palpations: Abdomen is soft.     Tenderness: There is no abdominal tenderness.  Musculoskeletal:     Cervical back: Normal range of motion.  Skin:    General: Skin is warm and dry.  Neurological:     Mental Status: She is alert and oriented to person, place, and time.     Coordination: Coordination normal.     Vitals:   04/16/24 0853  BP: 122/80  Pulse: 87  Weight: 197 lb 4.8 oz (89.5 kg)  Height: 5' (1.524 m)    Assessment & Plan:  Flu and prevnar 20 given at office today

## 2024-04-16 NOTE — Assessment & Plan Note (Addendum)
 Checking HGA1c, vitamin B12 and D. Adjust as needed. Rx wegovy  starting doses and follow up 3 months. She will continue her healthy lifestyle changes as well. Complicated by hyperlipidemia and hypertension.

## 2024-04-16 NOTE — Assessment & Plan Note (Signed)
 No flare today using albuterol  prn.

## 2024-04-16 NOTE — Patient Instructions (Addendum)
 We have sent in the wegovy  to start. Come back in about 3 months to see how this is doing.

## 2024-04-16 NOTE — Assessment & Plan Note (Signed)
 BP at goal. Checking CMP and adjust as needed.

## 2024-04-16 NOTE — Assessment & Plan Note (Signed)
 With recent chest wall muscle injury which is improving. Continue conservative measures likely full healing by 6 weeks.

## 2024-04-16 NOTE — Assessment & Plan Note (Signed)
 Flu shot given. Pneumonia given 20. Shingrix  complete. Tetanus due at pharmacy. Colonoscopy up to date. Mammogram up to date with gyn, pap smear up to date and dexa up to date with gyn. Counseled about sun safety and mole surveillance. Counseled about the dangers of distracted driving. Given 10 year screening recommendations.

## 2024-04-16 NOTE — Patient Instructions (Addendum)
 [name],  Thank you for taking the time for your Medicare Wellness Visit. I appreciate your continued commitment to your health goals. Please review the care plan we discussed, and feel free to reach out if I can assist you further.  Medicare recommends these wellness visits once per year to help you and your care team stay ahead of potential health issues. These visits are designed to focus on prevention, allowing your provider to concentrate on managing your acute and chronic conditions during your regular appointments.  Please note that Annual Wellness Visits do not include a physical exam. Some assessments may be limited, especially if the visit was conducted virtually. If needed, we may recommend a separate in-person follow-up with your provider.  Ongoing Care Seeing your primary care provider every 3 to 6 months helps us  monitor your health and provide consistent, personalized care. Last office visit on 08/29/2023.  You are due for a tetanus vaccine and a pneumonia vaccine, which you can get here during your office visit with your provider today or at your local pharmacy.  Each day, aim for 6 glasses of water, plenty of protein in your diet and try to get up and walk/ stretch every hour for 5-10 minutes at a time.    Referrals If a referral was made during today's visit and you haven't received any updates within two weeks, please contact the referred provider directly to check on the status.  Recommended Screenings:  Health Maintenance  Topic Date Due   DEXA scan (bone density measurement)  Never done   DTaP/Tdap/Td vaccine (2 - Td or Tdap) 06/17/2020   Pneumococcal Vaccine for age over 71 (2 of 2 - PCV) 11/20/2021   Breast Cancer Screening  08/29/2023   Flu Shot  01/16/2024   COVID-19 Vaccine (4 - 2025-26 season) 02/16/2024   Medicare Annual Wellness Visit  04/16/2025   Colon Cancer Screening  02/17/2029   Hepatitis C Screening  Completed   Zoster (Shingles) Vaccine  Completed    Meningitis B Vaccine  Aged Out       12/29/2022    5:12 PM  Advanced Directives  Does Patient Have a Medical Advance Directive? No  Would patient like information on creating a medical advance directive? No - Patient declined   Advance Care Planning is important because it: Ensures you receive medical care that aligns with your values, goals, and preferences. Provides guidance to your family and loved ones, reducing the emotional burden of decision-making during critical moments.  Vision: Annual vision screenings are recommended for early detection of glaucoma, cataracts, and diabetic retinopathy. These exams can also reveal signs of chronic conditions such as diabetes and high blood pressure.  Dental: Annual dental screenings help detect early signs of oral cancer, gum disease, and other conditions linked to overall health, including heart disease and diabetes.  Please see the attached documents for additional preventive care recommendations.

## 2024-04-16 NOTE — Addendum Note (Signed)
 Addended by: Shamell Hittle L on: 04/16/2024 09:23 AM   Modules accepted: Orders

## 2024-04-16 NOTE — Progress Notes (Signed)
 Subjective:   [name] [name] is a 68 y.o. who presents for a Medicare Wellness preventive visit.  As a reminder, Annual Wellness Visits don't include a physical exam, and some assessments may be limited, especially if this visit is performed virtually. We may recommend an in-person follow-up visit with your provider if needed.  Visit Complete: In person  Persons Participating in Visit: Patient.  AWV Questionnaire: No: Patient Medicare AWV questionnaire was not completed prior to this visit.  Cardiac Risk Factors include: advanced age (>38men, >3 women);hypertension;dyslipidemia     Objective:    Today's Vitals   04/16/24 0759 04/16/24 0809  BP:  122/80  Pulse:  87  SpO2:  98%  Weight:  197 lb 3.2 oz (89.4 kg)  Height:  5' (1.524 m)  PainSc: 2     Body mass index is 38.51 kg/m.     04/16/2024    8:25 AM 12/29/2022    5:12 PM 10/19/2022    9:00 PM 10/19/2022    4:43 PM 08/20/2022   11:19 AM 05/06/2022   11:42 PM 11/27/2021    3:27 PM  Advanced Directives  Does Patient Have a Medical Advance Directive? Yes No Yes No Yes Yes Yes  Type of Estate Agent of [place];Living will  Living will  Healthcare Power of [place];Living will;Out of facility DNR (pink MOST or yellow form) Healthcare Power of [place];Living will Living will;Healthcare Power of Attorney  Does patient want to make changes to medical advance directive?   No - Patient declined   No - Patient declined No - Patient declined  Copy of Healthcare Power of Attorney in Chart? No - copy requested     Yes - validated most recent copy scanned in chart (See row information) No - copy requested  Would patient like information on creating a medical advance directive?  No - Patient declined  No - Patient declined       Current Medications (verified) Outpatient Encounter Medications as of 04/16/2024  Medication Sig   acetaminophen  (TYLENOL ) 500 MG tablet Take 2 tablets (1,000 mg total) by mouth every 8  (eight) hours as needed.   albuterol  (VENTOLIN  HFA) 108 (90 Base) MCG/ACT inhaler INHALE 2 PUFFS INTO THE LUNGS EVERY 6 HOURS AS NEEDED FOR WHEEZE   ALPRAZolam  (XANAX ) 0.5 MG tablet TAKE 1 TABLET BY MOUTH TWICE A DAY AS NEEDED FOR ANXIETY   amLODipine  (NORVASC ) 10 MG tablet Take 1 tablet (10 mg total) by mouth daily.   atorvastatin  (LIPITOR) 40 MG tablet TAKE 1 TABLET BY MOUTH EVERY DAY   cyclobenzaprine (FLEXERIL) 5 MG tablet Take 5 mg by mouth 3 (three) times daily as needed for muscle spasms.   gabapentin  (NEURONTIN ) 300 MG capsule Take 300 mg by mouth at bedtime.   Multiple Vitamins-Minerals (BARIATRIC MULTIVITAMINS/IRON PO) Take 3 tablets by mouth daily.    nitroGLYCERIN  (NITROSTAT ) 0.4 MG SL tablet Place 1 tablet (0.4 mg total) under the tongue every 5 (five) minutes as needed for chest pain.   ondansetron  (ZOFRAN -ODT) 4 MG disintegrating tablet Take 1 tablet (4 mg total) by mouth every 8 (eight) hours as needed for nausea or vomiting.   predniSONE  (DELTASONE ) 20 MG tablet Take 2 tablets (40 mg total) by mouth daily with breakfast.   solifenacin  (VESICARE ) 10 MG tablet TAKE 1 TABLET BY MOUTH EVERY DAY   Venlafaxine  HCl 225 MG TB24 TAKE 1 TABLET BY MOUTH DAILY.   ASPIRIN  LOW DOSE 81 MG tablet Take 81 mg by mouth 2 (  two) times daily. (Patient not taking: Reported on 04/16/2024)   chlorpheniramine-HYDROcodone  (TUSSIONEX) 10-8 MG/5ML Take 5 mLs by mouth every 12 (twelve) hours as needed for cough. (Patient not taking: Reported on 04/16/2024)   docusate sodium  (COLACE) 100 MG capsule Take 100 mg by mouth 2 (two) times daily. (Patient not taking: Reported on 04/16/2024)   fexofenadine (ALLEGRA) 180 MG tablet Take 180 mg by mouth daily as needed for allergies. (Patient not taking: Reported on 04/16/2024)   oseltamivir  (TAMIFLU ) 75 MG capsule Take 1 capsule (75 mg total) by mouth 2 (two) times daily. (Patient not taking: Reported on 04/16/2024)   No facility-administered encounter medications on  file as of 04/16/2024.    Allergies (verified) Patient has no known allergies.   History: Past Medical History:  Diagnosis Date   Allergy    seasonal   Anxiety    Arthritis    Asthma    Cataract    Complication of anesthesia    headache after neck surgery   Ehlers-Danlos syndrome type III    Fatty liver    Fibromyalgia    GERD (gastroesophageal reflux disease)    history of   Heart murmur    History of blood in urine    History of bronchitis    History of cholelithiasis    History of kidney stones    Hyperlipemia    Hyperparathyroidism    Hypertension    no medication needed since bariatric surgery   Insomnia    Leg pain    Low back pain    Obese    history of   Osteoporosis    Pneumonia    history of    PONV (postoperative nausea and vomiting)    Pre-diabetes    no since weight loss   Sleep apnea    improved since weight loss   Past Surgical History:  Procedure Laterality Date   achilles tendon tibial tendon fusion     4 surgeries   APPENDECTOMY     BREAST BIOPSY Right    BREAST BIOPSY Left    BREAST BIOPSY Left    CERVICAL DISCECTOMY     2010   CESAREAN SECTION     CHOLECYSTECTOMY     COLONOSCOPY     CYSTOSCOPY WITH RETROGRADE PYELOGRAM, URETEROSCOPY AND STENT PLACEMENT Right 08/01/2017   Procedure: CYSTOSCOPY WITH RETROGRADE PYELOGRAM, URETEROSCOPY AND STENT PLACEMENT;  Surgeon: Alvaro Hummer, MD;  Location: [facility];  Service: Urology;  Laterality: Right;   GASTROPLASTY DUODENAL SWITCH  2017   HOLMIUM LASER APPLICATION Right 08/01/2017   Procedure: HOLMIUM LASER APPLICATION;  Surgeon: Alvaro Hummer, MD;  Location: [facility];  Service: Urology;  Laterality: Right;   JOINT REPLACEMENT Right    partial joint replacement knee   LAPAROSCOPIC APPENDECTOMY N/A 10/20/2022   Procedure: APPENDECTOMY LAPAROSCOPIC;  Surgeon: Kinsinger, Herlene Righter, MD;  Location: [facility] OR;  Service: General;  Laterality: N/A;   LAPAROSCOPIC  GASTRIC RESTRICTIVE DUODENAL PROCEDURE (DUODENAL SWITCH)     2017   lower back     2008   spinal injections     SPINE SURGERY     L4-L5, C6-C7    UPPER GASTROINTESTINAL ENDOSCOPY     Family History  Problem Relation Age of Onset   Parkinson's disease Mother        Deceased, 59   Heart disease Father        Deceased, 7   Pancreatic cancer Sister        Survivor  Healthy Daughter    Colon cancer Neg Hx    Colon polyps Neg Hx    Esophageal cancer Neg Hx    Rectal cancer Neg Hx    Stomach cancer Neg Hx    Breast cancer Neg Hx    Social History   Socioeconomic History   Marital status: Married    Spouse name: Alm   Number of children: 1   Years of education: Not on file   Highest education level: Not on file  Occupational History   Occupation: Retired  Tobacco Use   Smoking status: Never   Smokeless tobacco: Never  Vaping Use   Vaping status: Never Used  Substance and Sexual Activity   Alcohol use: Yes    Alcohol/week: 0.0 standard drinks of alcohol    Comment: rare   Drug use: No   Sexual activity: Not on file  Other Topics Concern   Not on file  Social History Narrative   Lives with husband in a one story home. Has 2 dogs/2025  Has 1 daughter.     On disability in 2009.  Used to work as an magazine features editor at [employer]      Social Drivers of Corporate Investment Banker Strain: Low Risk  (04/16/2024)   Overall Financial Resource Strain (CARDIA)    Difficulty of Paying Living Expenses: Not very hard  Food Insecurity: No Food Insecurity (04/16/2024)   Hunger Vital Sign    Worried About Running Out of Food in the Last Year: Never true    Ran Out of Food in the Last Year: Never true  Transportation Needs: No Transportation Needs (04/16/2024)   PRAPARE - Administrator, Civil Service (Medical): No    Lack of Transportation (Non-Medical): No  Physical Activity: Sufficiently Active (11/27/2021)   Exercise Vital Sign    Days of Exercise per Week: 5  days    Minutes of Exercise per Session: 30 min  Stress: Stress Concern Present (04/16/2024)   [employer] of Occupational Health - Occupational Stress Questionnaire    Feeling of Stress: To some extent  Social Connections: Moderately Isolated (04/16/2024)   Social Connection and Isolation Panel    Frequency of Communication with Friends and Family: More than three times a week    Frequency of Social Gatherings with Friends and Family: Twice a week    Attends Religious Services: Never    Database Administrator or Organizations: No    Attends Engineer, Structural: Never    Marital Status: Married    Tobacco Counseling Counseling given: Not Answered    Clinical Intake:  Pre-visit preparation completed: Yes  Pain : 0-10 Pain Score: 2  Pain Type: Acute pain (Patient rode her horse for the 1st time in 1 year) Pain Location: Knee (and chest muscle pulled/soreness) Pain Orientation: Right Pain Descriptors / Indicators: Aching, Discomfort, Sore Pain Onset: 1 to 4 weeks ago Pain Frequency: Constant Pain Relieving Factors: predniSONE  (DELTASONE ) 20 MG tablet  Pain Relieving Factors: predniSONE  (DELTASONE ) 20 MG tablet  BMI - recorded: 38.51 Nutritional Status: BMI > 30  Obese Nutritional Risks: None Diabetes: No  Lab Results  Component Value Date   HGBA1C 5.5 11/20/2020   HGBA1C 5.2 11/17/2018   HGBA1C 4.7 09/15/2017     How often do you need to have someone help you when you read instructions, pamphlets, or other written materials from your doctor or pharmacy?: 1 - Never  Interpreter Needed?: No  Information entered by ::  Cane Dubray, RMA   Activities of Daily Living     04/16/2024    7:59 AM  In your present state of health, do you have any difficulty performing the following activities:  Hearing? 0  Vision? 0  Difficulty concentrating or making decisions? 0  Walking or climbing stairs? 0  Dressing or bathing? 0  Doing errands, shopping? 0   Preparing Food and eating ? N  Using the Toilet? N  In the past six months, have you accidently leaked urine? Y  Do you have problems with loss of bowel control? N  Managing your Medications? N  Managing your Finances? N  Housekeeping or managing your Housekeeping? N    Patient Care Team: Rollene Almarie LABOR, MD as PCP - General (Internal Medicine) Loni Soyla LABOR, MD as PCP - Cardiology (Cardiology) Maree Lonni Inks, MD as Consulting Physician (Ophthalmology) Patel, Donika K, DO as Consulting Physician (Neurology)  I have updated your Care Teams any recent Medical Services you may have received from other providers in the past year.     Assessment:   This is a routine wellness examination for Ronette.  Hearing/Vision screen Hearing Screening - Comments:: Denies hearing difficulties   Vision Screening - Comments:: Weras eyeglasses/Dr. Pecen/Patient up to date   Goals Addressed             This Visit's Progress    My goal is to lose weight and continue with the Bariatric Wellness Program.   Not on track    Also continue working out at [employer].     Patient Stated       Would like to have rt knee replaced/2025       Depression Screen     04/16/2024    8:28 AM 03/24/2023    8:46 AM 11/27/2021    3:26 PM 11/20/2020   10:05 AM 11/17/2018   10:21 AM 03/17/2017   11:10 AM  PHQ 2/9 Scores  PHQ - 2 Score 1 0 0 0 0 0  PHQ- 9 Score 4 0        Fall Risk     04/16/2024    8:20 AM 03/24/2023    8:46 AM 08/20/2022   11:18 AM 03/05/2022    1:06 PM 11/27/2021    3:29 PM  Fall Risk   Falls in the past year? 0 0 1 0 0  Number falls in past yr: 0 0 0 0 0  Injury with Fall? 0 0 1 0 0  Risk for fall due to : Impaired balance/gait    No Fall Risks  Follow up Falls evaluation completed;Falls prevention discussed Falls evaluation completed Falls evaluation completed  Falls evaluation completed      Data saved with a previous flowsheet row definition     MEDICARE RISK AT HOME:  Medicare Risk at Home Any stairs in or around the home?: No If so, are there any without handrails?: No Home free of loose throw rugs in walkways, pet beds, electrical cords, etc?: Yes Adequate lighting in your home to reduce risk of falls?: Yes Life alert?: No Use of a cane, walker or w/c?: Yes (uses a cane) Grab bars in the bathroom?: No Shower chair or bench in shower?: No Elevated toilet seat or a handicapped toilet?: Yes  TIMED UP AND GO:  Was the test performed?  Yes  Length of time to ambulate 10 feet: 20 sec Gait slow and steady with assistive device  Cognitive Function: 6CIT completed  04/16/2024    8:26 AM 11/27/2021    3:30 PM  6CIT Screen  What Year? 0 points 0 points  What month? 0 points 0 points  What time? 0 points 0 points  Count back from 20 0 points 0 points  Months in reverse 0 points 0 points  Repeat phrase 0 points 0 points  Total Score 0 points 0 points    Immunizations Immunization History  Administered Date(s) Administered   Fluad Quad(high Dose 65+) 03/05/2022   Fluad Trivalent(High Dose 65+) 03/24/2023   INFLUENZA, HIGH DOSE SEASONAL PF 03/01/2016, 03/07/2021   Influenza, Quadrivalent, Recombinant, Inj, Pf 02/23/2018   Influenza,inj,Quad PF,6+ Mos 05/31/2014, 03/02/2015, 03/17/2017   Influenza-Unspecified 02/26/2018, 02/16/2019, 02/27/2022   PFIZER(Purple Top)SARS-COV-2 Vaccination 09/27/2019, 10/12/2019   Pfizer Covid-19 Vaccine Bivalent Booster 48yrs & up 03/07/2021   Pneumococcal Polysaccharide-23 11/20/2020   Tdap 06/17/2010   Zoster Recombinant(Shingrix ) 11/17/2018, 01/19/2019   Zoster, Live 08/30/2015, 02/27/2021    Screening Tests Health Maintenance  Topic Date Due   DEXA SCAN  Never done   DTaP/Tdap/Td (2 - Td or Tdap) 06/17/2020   Pneumococcal Vaccine: 50+ Years (2 of 2 - PCV) 11/20/2021   Mammogram  08/29/2023   Influenza Vaccine  01/16/2024   COVID-19 Vaccine (4 - 2025-26 season)  02/16/2024   Medicare Annual Wellness (AWV)  04/16/2025   Colonoscopy  02/17/2029   Hepatitis C Screening  Completed   Zoster Vaccines- Shingrix   Completed   Meningococcal B Vaccine  Aged Out    Health Maintenance Items Addressed: Vaccines Given today: flu, Vaccines Due: pneumonia, See Nurse Notes at the end of this note  Additional Screening:  Vision Screening: Recommended annual ophthalmology exams for early detection of glaucoma and other disorders of the eye. Is the patient up to date with their annual eye exam?  Yes  Who is the provider or what is the name of the office in which the patient attends annual eye exams? Dr. Eleuterio up to date  Dental Screening: Recommended annual dental exams for proper oral hygiene  Community Resource Referral / Chronic Care Management: CRR required this visit?  No   CCM required this visit?  No   Plan:    I have personally reviewed and noted the following in the patient's chart:   Medical and social history Use of alcohol, tobacco or illicit drugs  Current medications and supplements including opioid prescriptions. Patient is not currently taking opioid prescriptions. Functional ability and status Nutritional status Physical activity Advanced directives List of other physicians Hospitalizations, surgeries, and ER visits in previous 12 months Vitals Screenings to include cognitive, depression, and falls Referrals and appointments  In addition, I have reviewed and discussed with patient certain preventive protocols, quality metrics, and best practice recommendations. A written personalized care plan for preventive services as well as general preventive health recommendations were provided to patient.   Vaudine Dutan L Huel Centola, CMA   04/16/2024   After Visit Summary: (MyChart) Due to this being a telephonic visit, the after visit summary with patients personalized plan was offered to patient via MyChart   Notes: Patient is due for a  pneumonia vaccine and would like to discuss with provider today.  Patient stated that she is up to date with her mammogram and DEXA, which I have sent a request for her screening results out today to her GYN.  Patient had no other concerns to address with me today.

## 2024-05-02 ENCOUNTER — Other Ambulatory Visit: Payer: Self-pay | Admitting: Internal Medicine

## 2024-05-05 DIAGNOSIS — H353132 Nonexudative age-related macular degeneration, bilateral, intermediate dry stage: Secondary | ICD-10-CM | POA: Diagnosis not present

## 2024-05-11 ENCOUNTER — Other Ambulatory Visit: Payer: Self-pay | Admitting: Internal Medicine

## 2024-05-19 DIAGNOSIS — H26491 Other secondary cataract, right eye: Secondary | ICD-10-CM | POA: Diagnosis not present

## 2024-06-16 ENCOUNTER — Ambulatory Visit: Payer: Self-pay | Admitting: Internal Medicine

## 2024-06-16 ENCOUNTER — Other Ambulatory Visit: Payer: Self-pay | Admitting: Internal Medicine

## 2024-06-16 ENCOUNTER — Telehealth: Payer: Self-pay | Admitting: Family Medicine

## 2024-06-16 MED ORDER — VENLAFAXINE HCL ER 225 MG PO TB24: 1.0000 | ORAL_TABLET | Freq: Every day | ORAL | 0 refills | Status: DC

## 2024-06-16 NOTE — Telephone Encounter (Signed)
" °  Reason for Disposition  [1] Prescription refill request for ESSENTIAL medicine (i.e., likelihood of harm to patient if not taken) AND [2] triager unable to refill per department policy  Answer Assessment - Initial Assessment Questions Patient ran out of Venlafaxine . Last dose was taken at noon on 06/15/24. Had been spreading them out due to running low. States she [facility].   States she is experiencing mild nausea.   [id number] [place], [place] - [address] [phone]   This RN spoke to on call provider whom stated he will send the order now. This RN made patient aware.  1. DRUG NAME: What medicine do you need to have refilled?     Venlafaxine   2. REFILLS REMAINING: How many refills are remaining? Notes: The label on the medicine or pill bottle will show how many refills are remaining. If there are no refills remaining, then a renewal may be needed.     [facility] states [facility] do not [facility]ills on record  Protocols used: Medication Refill and Renewal Call-A-AH  "

## 2024-06-16 NOTE — Telephone Encounter (Signed)
 Received a call from [facility]. [name] had contacted the call center as she has run out of her venlafaxine . She has been noting some withdrawal symptoms off of the medication.  I will send a renewal of her medication for 30 days. She should contact Dr. Rollene regarding further fills.  Her Active Problem List does not include a psychiatric diagnosis, so it is unclear why she is prescribed this medication. This should be addressed by Dr. Rollene.

## 2024-06-25 ENCOUNTER — Other Ambulatory Visit: Payer: Self-pay | Admitting: Internal Medicine

## 2024-06-25 DIAGNOSIS — E782 Mixed hyperlipidemia: Secondary | ICD-10-CM

## 2024-07-07 ENCOUNTER — Ambulatory Visit: Admitting: Internal Medicine

## 2024-07-07 VITALS — BP 122/70 | HR 86 | Temp 98.2°F | Ht 60.0 in | Wt 195.4 lb

## 2024-07-07 DIAGNOSIS — E782 Mixed hyperlipidemia: Secondary | ICD-10-CM

## 2024-07-07 DIAGNOSIS — R519 Headache, unspecified: Secondary | ICD-10-CM | POA: Diagnosis not present

## 2024-07-07 MED ORDER — ATORVASTATIN CALCIUM 40 MG PO TABS: 40.0000 mg | ORAL_TABLET | Freq: Every day | ORAL | 3 refills | Status: AC

## 2024-07-07 MED ORDER — VENLAFAXINE HCL ER 225 MG PO TB24: 1.0000 | ORAL_TABLET | Freq: Every day | ORAL | 3 refills | Status: AC

## 2024-07-07 MED ORDER — SUMATRIPTAN SUCCINATE 25 MG PO TABS: 25.0000 mg | ORAL_TABLET | ORAL | 0 refills | Status: AC | PRN

## 2024-07-07 MED ORDER — WEGOVY 9 MG PO TABS: 9.0000 mg | ORAL_TABLET | Freq: Every day | ORAL | 0 refills | Status: AC

## 2024-07-07 MED ORDER — WEGOVY 25 MG PO TABS: 25.0000 mg | ORAL_TABLET | Freq: Every day | ORAL | 3 refills | Status: AC

## 2024-07-07 MED ORDER — WEGOVY 4 MG PO TABS: 4.0000 mg | ORAL_TABLET | Freq: Every day | ORAL | 0 refills | Status: AC

## 2024-07-07 MED ORDER — ONDANSETRON 4 MG PO TBDP: 4.0000 mg | ORAL_TABLET | Freq: Three times a day (TID) | ORAL | 0 refills | Status: AC | PRN

## 2024-07-07 MED ORDER — WEGOVY 1.5 MG PO TABS: 1.5000 mg | ORAL_TABLET | Freq: Every day | ORAL | 0 refills | Status: AC

## 2024-07-07 NOTE — Progress Notes (Signed)
 "  Subjective:   Patient ID: [name] [name], female    DOB: 01/18/1956, 69 y.o.   MRN: [id number]  Discussed the use of AI scribe software for clinical note transcription with the patient, who gave verbal consent to proceed.  History of Present Illness [name] [name] is a 69 year old female who presents with recurrent severe headaches and associated nausea.  She has been experiencing severe headaches that began on New Year's Day. The headaches are sudden and intense, described as occurring 'like a flash out of the blue.' The first episode was accompanied by nausea and lasted for three days. Initially, she thought it was related to flu-like symptoms, as she had concurrent gastrointestinal symptoms and her husband was also unwell.  A second episode occurred the following Friday night while she was in the same position watching TV. This headache was similarly intense and lasted for another three days, again accompanied by nausea. She took various over-the-counter medications, including decongestants and pain relievers, but none provided relief.  Since the resolution of the second headache episode around January 12th, she has not experienced any further headaches but lives in fear of her recurrence, especially over a weekend when she might not have access to medical care.  No neurological symptoms such as numbness, tingling, facial drooping, or double vision. She recently visited an ophthalmologist who removed scar tissue from her eyes, but no other issues were noted.  Review of Systems  Constitutional: Negative.   HENT: Negative.    Eyes: Negative.   Respiratory:  Negative for cough, chest tightness and shortness of breath.   Cardiovascular:  Negative for chest pain, palpitations and leg swelling.  Gastrointestinal:  Negative for abdominal distention, abdominal pain, constipation, diarrhea, nausea and vomiting.  Musculoskeletal: Negative.   Skin: Negative.   Neurological:  Positive for  headaches.  Psychiatric/Behavioral: Negative.      Objective:  Physical Exam Constitutional:      Appearance: She is well-developed.  HENT:     Head: Normocephalic and atraumatic.  Eyes:     Extraocular Movements: Extraocular movements intact.     Conjunctiva/sclera: Conjunctivae normal.     Pupils: Pupils are equal, round, and reactive to light.  Cardiovascular:     Rate and Rhythm: Normal rate and regular rhythm.  Pulmonary:     Effort: Pulmonary effort is normal. No respiratory distress.     Breath sounds: Normal breath sounds. No wheezing or rales.  Abdominal:     General: Bowel sounds are normal. There is no distension.     Palpations: Abdomen is soft.     Tenderness: There is no abdominal tenderness.  Musculoskeletal:     Cervical back: Normal range of motion.  Skin:    General: Skin is warm and dry.  Neurological:     Mental Status: She is alert and oriented to person, place, and time.     Cranial Nerves: No cranial nerve deficit.     Sensory: No sensory deficit.     Coordination: Coordination normal.     Vitals:   07/07/24 1047  BP: 122/70  Pulse: 86  Temp: 98.2 F (36.8 C)  TempSrc: Oral  SpO2: 97%  Weight: 195 lb 6.4 oz (88.6 kg)  Height: 5' (1.524 m)    Assessment and Plan Assessment & Plan New onset migraine-like headaches after age 77   She experiences intermittent, intense headaches with nausea, lasting up to three days. The differential includes migraine and post-viral headache, with a brain tumor being  unlikely. An MRI of the brain is ordered. Prescribe sumatriptan  for acute migraine management and ondansetron  for nausea.    "

## 2024-07-07 NOTE — Patient Instructions (Signed)
 We have sent in sumatriptan  for the headache and zofran  to take.   WE will get the MRI to check.

## 2024-07-09 ENCOUNTER — Encounter: Payer: Self-pay | Admitting: Internal Medicine

## 2024-07-09 DIAGNOSIS — R519 Headache, unspecified: Secondary | ICD-10-CM | POA: Insufficient documentation
